# Patient Record
Sex: Female | Born: 1935
Health system: Southern US, Community
[De-identification: ages and names within clinical notes are randomized; demographics above are authoritative.]

## PROBLEM LIST (undated history)

## (undated) DIAGNOSIS — E079 Disorder of thyroid, unspecified: Secondary | ICD-10-CM

## (undated) DIAGNOSIS — C50919 Malignant neoplasm of unspecified site of unspecified female breast: Secondary | ICD-10-CM

## (undated) DIAGNOSIS — T7840XA Allergy, unspecified, initial encounter: Secondary | ICD-10-CM

## (undated) DIAGNOSIS — I10 Essential (primary) hypertension: Secondary | ICD-10-CM

## (undated) DIAGNOSIS — K219 Gastro-esophageal reflux disease without esophagitis: Secondary | ICD-10-CM

## (undated) DIAGNOSIS — I480 Paroxysmal atrial fibrillation: Secondary | ICD-10-CM

## (undated) DIAGNOSIS — F419 Anxiety disorder, unspecified: Secondary | ICD-10-CM

## (undated) DIAGNOSIS — I252 Old myocardial infarction: Secondary | ICD-10-CM

## (undated) DIAGNOSIS — I2510 Atherosclerotic heart disease of native coronary artery without angina pectoris: Secondary | ICD-10-CM

## (undated) DIAGNOSIS — B0229 Other postherpetic nervous system involvement: Secondary | ICD-10-CM

## (undated) DIAGNOSIS — I5030 Unspecified diastolic (congestive) heart failure: Secondary | ICD-10-CM

## (undated) DIAGNOSIS — D649 Anemia, unspecified: Secondary | ICD-10-CM

## (undated) DIAGNOSIS — E7800 Pure hypercholesterolemia, unspecified: Secondary | ICD-10-CM

## (undated) DIAGNOSIS — I214 Non-ST elevation (NSTEMI) myocardial infarction: Principal | ICD-10-CM

## (undated) DIAGNOSIS — M1990 Unspecified osteoarthritis, unspecified site: Secondary | ICD-10-CM

## (undated) DIAGNOSIS — M810 Age-related osteoporosis without current pathological fracture: Secondary | ICD-10-CM

## (undated) HISTORY — PX: BREAST SURGERY: SHX581

## (undated) HISTORY — PX: OTHER SURGICAL HISTORY: SHX169

## (undated) HISTORY — DX: Unspecified osteoarthritis, unspecified site: M19.90

## (undated) HISTORY — DX: Paroxysmal atrial fibrillation: I48.0

## (undated) HISTORY — PX: TONSILLECTOMY: SUR1361

## (undated) HISTORY — DX: Age-related osteoporosis without current pathological fracture: M81.0

## (undated) HISTORY — DX: Non-ST elevation (NSTEMI) myocardial infarction: I21.4

## (undated) HISTORY — DX: Anemia, unspecified: D64.9

## (undated) HISTORY — DX: Old myocardial infarction: I25.2

## (undated) HISTORY — DX: Other postherpetic nervous system involvement: B02.29

## (undated) HISTORY — DX: Allergy, unspecified, initial encounter: T78.40XA

---

## 2000-02-21 ENCOUNTER — Other Ambulatory Visit: Admission: RE | Admit: 2000-02-21 | Discharge: 2000-02-21 | Payer: Self-pay | Admitting: *Deleted

## 2000-03-27 ENCOUNTER — Encounter: Admission: RE | Admit: 2000-03-27 | Discharge: 2000-06-25 | Payer: Self-pay | Admitting: Radiation Oncology

## 2004-09-13 ENCOUNTER — Ambulatory Visit: Payer: Self-pay | Admitting: Oncology

## 2005-03-19 ENCOUNTER — Ambulatory Visit: Payer: Self-pay | Admitting: Oncology

## 2006-03-18 ENCOUNTER — Ambulatory Visit: Payer: Self-pay | Admitting: Oncology

## 2006-06-12 ENCOUNTER — Ambulatory Visit: Payer: Self-pay | Admitting: Oncology

## 2007-03-10 ENCOUNTER — Ambulatory Visit: Payer: Self-pay | Admitting: Oncology

## 2011-10-15 DIAGNOSIS — H1045 Other chronic allergic conjunctivitis: Secondary | ICD-10-CM | POA: Diagnosis not present

## 2011-10-15 DIAGNOSIS — H04129 Dry eye syndrome of unspecified lacrimal gland: Secondary | ICD-10-CM | POA: Diagnosis not present

## 2011-10-17 DIAGNOSIS — L821 Other seborrheic keratosis: Secondary | ICD-10-CM | POA: Diagnosis not present

## 2011-10-17 DIAGNOSIS — L723 Sebaceous cyst: Secondary | ICD-10-CM | POA: Diagnosis not present

## 2011-10-17 DIAGNOSIS — D235 Other benign neoplasm of skin of trunk: Secondary | ICD-10-CM | POA: Diagnosis not present

## 2011-10-23 DIAGNOSIS — R55 Syncope and collapse: Secondary | ICD-10-CM | POA: Diagnosis not present

## 2011-11-12 DIAGNOSIS — E039 Hypothyroidism, unspecified: Secondary | ICD-10-CM | POA: Diagnosis not present

## 2011-11-12 DIAGNOSIS — Z79899 Other long term (current) drug therapy: Secondary | ICD-10-CM | POA: Diagnosis not present

## 2011-11-12 DIAGNOSIS — E871 Hypo-osmolality and hyponatremia: Secondary | ICD-10-CM | POA: Diagnosis not present

## 2011-11-12 DIAGNOSIS — R1031 Right lower quadrant pain: Secondary | ICD-10-CM | POA: Diagnosis not present

## 2011-11-12 DIAGNOSIS — R55 Syncope and collapse: Secondary | ICD-10-CM | POA: Diagnosis not present

## 2011-11-16 DIAGNOSIS — M5137 Other intervertebral disc degeneration, lumbosacral region: Secondary | ICD-10-CM | POA: Diagnosis not present

## 2011-11-16 DIAGNOSIS — R1031 Right lower quadrant pain: Secondary | ICD-10-CM | POA: Diagnosis not present

## 2011-11-16 DIAGNOSIS — K7689 Other specified diseases of liver: Secondary | ICD-10-CM | POA: Diagnosis not present

## 2011-11-16 DIAGNOSIS — M47817 Spondylosis without myelopathy or radiculopathy, lumbosacral region: Secondary | ICD-10-CM | POA: Diagnosis not present

## 2011-11-21 DIAGNOSIS — R55 Syncope and collapse: Secondary | ICD-10-CM | POA: Diagnosis not present

## 2012-02-20 DIAGNOSIS — N6459 Other signs and symptoms in breast: Secondary | ICD-10-CM | POA: Diagnosis not present

## 2012-02-20 DIAGNOSIS — Z853 Personal history of malignant neoplasm of breast: Secondary | ICD-10-CM | POA: Diagnosis not present

## 2012-02-27 DIAGNOSIS — C50319 Malignant neoplasm of lower-inner quadrant of unspecified female breast: Secondary | ICD-10-CM | POA: Diagnosis not present

## 2012-02-27 DIAGNOSIS — N640 Fissure and fistula of nipple: Secondary | ICD-10-CM | POA: Diagnosis not present

## 2012-03-13 DIAGNOSIS — E039 Hypothyroidism, unspecified: Secondary | ICD-10-CM | POA: Diagnosis not present

## 2012-03-13 DIAGNOSIS — E038 Other specified hypothyroidism: Secondary | ICD-10-CM | POA: Diagnosis not present

## 2012-03-13 DIAGNOSIS — J309 Allergic rhinitis, unspecified: Secondary | ICD-10-CM | POA: Diagnosis not present

## 2012-03-13 DIAGNOSIS — E538 Deficiency of other specified B group vitamins: Secondary | ICD-10-CM | POA: Diagnosis not present

## 2012-03-13 DIAGNOSIS — R195 Other fecal abnormalities: Secondary | ICD-10-CM | POA: Diagnosis not present

## 2012-03-13 DIAGNOSIS — M810 Age-related osteoporosis without current pathological fracture: Secondary | ICD-10-CM | POA: Diagnosis not present

## 2012-03-13 DIAGNOSIS — R42 Dizziness and giddiness: Secondary | ICD-10-CM | POA: Diagnosis not present

## 2012-03-13 DIAGNOSIS — F341 Dysthymic disorder: Secondary | ICD-10-CM | POA: Diagnosis not present

## 2012-03-13 DIAGNOSIS — I10 Essential (primary) hypertension: Secondary | ICD-10-CM | POA: Diagnosis not present

## 2012-03-17 DIAGNOSIS — M533 Sacrococcygeal disorders, not elsewhere classified: Secondary | ICD-10-CM | POA: Diagnosis not present

## 2012-03-19 DIAGNOSIS — M533 Sacrococcygeal disorders, not elsewhere classified: Secondary | ICD-10-CM | POA: Diagnosis not present

## 2012-03-21 DIAGNOSIS — M533 Sacrococcygeal disorders, not elsewhere classified: Secondary | ICD-10-CM | POA: Diagnosis not present

## 2012-03-24 DIAGNOSIS — M533 Sacrococcygeal disorders, not elsewhere classified: Secondary | ICD-10-CM | POA: Diagnosis not present

## 2012-03-26 DIAGNOSIS — M533 Sacrococcygeal disorders, not elsewhere classified: Secondary | ICD-10-CM | POA: Diagnosis not present

## 2012-03-31 DIAGNOSIS — M533 Sacrococcygeal disorders, not elsewhere classified: Secondary | ICD-10-CM | POA: Diagnosis not present

## 2012-04-02 DIAGNOSIS — M533 Sacrococcygeal disorders, not elsewhere classified: Secondary | ICD-10-CM | POA: Diagnosis not present

## 2012-04-04 DIAGNOSIS — M533 Sacrococcygeal disorders, not elsewhere classified: Secondary | ICD-10-CM | POA: Diagnosis not present

## 2012-04-07 DIAGNOSIS — M533 Sacrococcygeal disorders, not elsewhere classified: Secondary | ICD-10-CM | POA: Diagnosis not present

## 2012-04-13 DIAGNOSIS — J069 Acute upper respiratory infection, unspecified: Secondary | ICD-10-CM | POA: Diagnosis not present

## 2012-04-13 DIAGNOSIS — J209 Acute bronchitis, unspecified: Secondary | ICD-10-CM | POA: Diagnosis not present

## 2012-05-13 DIAGNOSIS — R42 Dizziness and giddiness: Secondary | ICD-10-CM | POA: Diagnosis not present

## 2012-05-13 DIAGNOSIS — J309 Allergic rhinitis, unspecified: Secondary | ICD-10-CM | POA: Diagnosis not present

## 2012-05-13 DIAGNOSIS — F341 Dysthymic disorder: Secondary | ICD-10-CM | POA: Diagnosis not present

## 2012-05-13 DIAGNOSIS — E039 Hypothyroidism, unspecified: Secondary | ICD-10-CM | POA: Diagnosis not present

## 2012-05-13 DIAGNOSIS — I10 Essential (primary) hypertension: Secondary | ICD-10-CM | POA: Diagnosis not present

## 2012-05-13 DIAGNOSIS — F5102 Adjustment insomnia: Secondary | ICD-10-CM | POA: Diagnosis not present

## 2012-05-13 DIAGNOSIS — M810 Age-related osteoporosis without current pathological fracture: Secondary | ICD-10-CM | POA: Diagnosis not present

## 2012-05-13 DIAGNOSIS — G459 Transient cerebral ischemic attack, unspecified: Secondary | ICD-10-CM | POA: Diagnosis not present

## 2012-05-26 DIAGNOSIS — R55 Syncope and collapse: Secondary | ICD-10-CM | POA: Diagnosis not present

## 2012-06-04 DIAGNOSIS — R413 Other amnesia: Secondary | ICD-10-CM | POA: Diagnosis not present

## 2012-06-04 DIAGNOSIS — F5102 Adjustment insomnia: Secondary | ICD-10-CM | POA: Diagnosis not present

## 2012-06-04 DIAGNOSIS — F411 Generalized anxiety disorder: Secondary | ICD-10-CM | POA: Diagnosis not present

## 2012-06-04 DIAGNOSIS — E038 Other specified hypothyroidism: Secondary | ICD-10-CM | POA: Diagnosis not present

## 2012-06-04 DIAGNOSIS — I10 Essential (primary) hypertension: Secondary | ICD-10-CM | POA: Diagnosis not present

## 2012-06-17 DIAGNOSIS — M5137 Other intervertebral disc degeneration, lumbosacral region: Secondary | ICD-10-CM | POA: Diagnosis not present

## 2012-06-17 DIAGNOSIS — M25559 Pain in unspecified hip: Secondary | ICD-10-CM | POA: Diagnosis not present

## 2012-06-24 DIAGNOSIS — M25559 Pain in unspecified hip: Secondary | ICD-10-CM | POA: Diagnosis not present

## 2012-07-01 DIAGNOSIS — R413 Other amnesia: Secondary | ICD-10-CM | POA: Diagnosis not present

## 2012-07-01 DIAGNOSIS — H9319 Tinnitus, unspecified ear: Secondary | ICD-10-CM | POA: Diagnosis not present

## 2012-07-01 DIAGNOSIS — I10 Essential (primary) hypertension: Secondary | ICD-10-CM | POA: Diagnosis not present

## 2012-07-01 DIAGNOSIS — F5102 Adjustment insomnia: Secondary | ICD-10-CM | POA: Diagnosis not present

## 2012-07-01 DIAGNOSIS — H9190 Unspecified hearing loss, unspecified ear: Secondary | ICD-10-CM | POA: Diagnosis not present

## 2012-07-01 DIAGNOSIS — F411 Generalized anxiety disorder: Secondary | ICD-10-CM | POA: Diagnosis not present

## 2012-07-29 DIAGNOSIS — Z23 Encounter for immunization: Secondary | ICD-10-CM | POA: Diagnosis not present

## 2012-07-29 DIAGNOSIS — G459 Transient cerebral ischemic attack, unspecified: Secondary | ICD-10-CM | POA: Diagnosis not present

## 2012-07-29 DIAGNOSIS — H9319 Tinnitus, unspecified ear: Secondary | ICD-10-CM | POA: Diagnosis not present

## 2012-07-29 DIAGNOSIS — H538 Other visual disturbances: Secondary | ICD-10-CM | POA: Diagnosis not present

## 2012-07-29 DIAGNOSIS — F411 Generalized anxiety disorder: Secondary | ICD-10-CM | POA: Diagnosis not present

## 2012-07-30 DIAGNOSIS — E038 Other specified hypothyroidism: Secondary | ICD-10-CM | POA: Diagnosis not present

## 2012-07-30 DIAGNOSIS — Z79899 Other long term (current) drug therapy: Secondary | ICD-10-CM | POA: Diagnosis not present

## 2012-08-06 DIAGNOSIS — H538 Other visual disturbances: Secondary | ICD-10-CM | POA: Diagnosis not present

## 2012-08-06 DIAGNOSIS — G459 Transient cerebral ischemic attack, unspecified: Secondary | ICD-10-CM | POA: Diagnosis not present

## 2012-08-06 DIAGNOSIS — I6529 Occlusion and stenosis of unspecified carotid artery: Secondary | ICD-10-CM | POA: Diagnosis not present

## 2012-08-06 DIAGNOSIS — I658 Occlusion and stenosis of other precerebral arteries: Secondary | ICD-10-CM | POA: Diagnosis not present

## 2012-08-06 DIAGNOSIS — R413 Other amnesia: Secondary | ICD-10-CM | POA: Diagnosis not present

## 2012-08-11 DIAGNOSIS — L219 Seborrheic dermatitis, unspecified: Secondary | ICD-10-CM | POA: Diagnosis not present

## 2012-08-11 DIAGNOSIS — L02818 Cutaneous abscess of other sites: Secondary | ICD-10-CM | POA: Diagnosis not present

## 2012-08-11 DIAGNOSIS — L723 Sebaceous cyst: Secondary | ICD-10-CM | POA: Diagnosis not present

## 2012-09-09 DIAGNOSIS — L219 Seborrheic dermatitis, unspecified: Secondary | ICD-10-CM | POA: Diagnosis not present

## 2012-09-22 DIAGNOSIS — R1084 Generalized abdominal pain: Secondary | ICD-10-CM | POA: Diagnosis not present

## 2012-09-22 DIAGNOSIS — G459 Transient cerebral ischemic attack, unspecified: Secondary | ICD-10-CM | POA: Diagnosis not present

## 2012-10-07 DIAGNOSIS — L03211 Cellulitis of face: Secondary | ICD-10-CM | POA: Diagnosis not present

## 2012-10-07 DIAGNOSIS — L0201 Cutaneous abscess of face: Secondary | ICD-10-CM | POA: Diagnosis not present

## 2012-11-06 DIAGNOSIS — K589 Irritable bowel syndrome without diarrhea: Secondary | ICD-10-CM | POA: Diagnosis not present

## 2012-11-17 DIAGNOSIS — M810 Age-related osteoporosis without current pathological fracture: Secondary | ICD-10-CM | POA: Diagnosis not present

## 2012-12-01 DIAGNOSIS — M545 Low back pain: Secondary | ICD-10-CM | POA: Diagnosis not present

## 2013-01-20 DIAGNOSIS — F331 Major depressive disorder, recurrent, moderate: Secondary | ICD-10-CM | POA: Diagnosis not present

## 2013-01-20 DIAGNOSIS — I10 Essential (primary) hypertension: Secondary | ICD-10-CM | POA: Diagnosis not present

## 2013-01-20 DIAGNOSIS — E038 Other specified hypothyroidism: Secondary | ICD-10-CM | POA: Diagnosis not present

## 2013-02-13 DIAGNOSIS — H04129 Dry eye syndrome of unspecified lacrimal gland: Secondary | ICD-10-CM | POA: Diagnosis not present

## 2013-02-13 DIAGNOSIS — H26499 Other secondary cataract, unspecified eye: Secondary | ICD-10-CM | POA: Diagnosis not present

## 2013-02-13 DIAGNOSIS — H3530 Unspecified macular degeneration: Secondary | ICD-10-CM | POA: Diagnosis not present

## 2013-04-16 DIAGNOSIS — Z1231 Encounter for screening mammogram for malignant neoplasm of breast: Secondary | ICD-10-CM | POA: Diagnosis not present

## 2013-05-06 DIAGNOSIS — E018 Other iodine-deficiency related thyroid disorders and allied conditions: Secondary | ICD-10-CM | POA: Diagnosis not present

## 2013-05-06 DIAGNOSIS — E038 Other specified hypothyroidism: Secondary | ICD-10-CM | POA: Diagnosis not present

## 2013-05-19 DIAGNOSIS — I10 Essential (primary) hypertension: Secondary | ICD-10-CM | POA: Diagnosis not present

## 2013-05-19 DIAGNOSIS — E018 Other iodine-deficiency related thyroid disorders and allied conditions: Secondary | ICD-10-CM | POA: Diagnosis not present

## 2013-05-19 DIAGNOSIS — M545 Low back pain: Secondary | ICD-10-CM | POA: Diagnosis not present

## 2013-05-19 DIAGNOSIS — M810 Age-related osteoporosis without current pathological fracture: Secondary | ICD-10-CM | POA: Diagnosis not present

## 2013-07-03 DIAGNOSIS — R0789 Other chest pain: Secondary | ICD-10-CM | POA: Diagnosis not present

## 2013-07-03 DIAGNOSIS — I10 Essential (primary) hypertension: Secondary | ICD-10-CM | POA: Diagnosis not present

## 2013-07-03 DIAGNOSIS — R0602 Shortness of breath: Secondary | ICD-10-CM | POA: Diagnosis not present

## 2013-07-06 DIAGNOSIS — Z23 Encounter for immunization: Secondary | ICD-10-CM | POA: Diagnosis not present

## 2013-07-09 DIAGNOSIS — R0602 Shortness of breath: Secondary | ICD-10-CM | POA: Diagnosis not present

## 2013-07-09 DIAGNOSIS — Z87891 Personal history of nicotine dependence: Secondary | ICD-10-CM | POA: Diagnosis not present

## 2013-07-09 DIAGNOSIS — R05 Cough: Secondary | ICD-10-CM | POA: Diagnosis not present

## 2013-07-22 DIAGNOSIS — E039 Hypothyroidism, unspecified: Secondary | ICD-10-CM | POA: Diagnosis not present

## 2013-08-05 DIAGNOSIS — J45909 Unspecified asthma, uncomplicated: Secondary | ICD-10-CM | POA: Diagnosis not present

## 2013-08-17 DIAGNOSIS — H3530 Unspecified macular degeneration: Secondary | ICD-10-CM | POA: Diagnosis not present

## 2013-10-05 DIAGNOSIS — N3000 Acute cystitis without hematuria: Secondary | ICD-10-CM | POA: Diagnosis not present

## 2013-10-13 DIAGNOSIS — A020 Salmonella enteritis: Secondary | ICD-10-CM | POA: Diagnosis not present

## 2013-10-21 DIAGNOSIS — N3000 Acute cystitis without hematuria: Secondary | ICD-10-CM | POA: Diagnosis not present

## 2013-11-30 DIAGNOSIS — M810 Age-related osteoporosis without current pathological fracture: Secondary | ICD-10-CM | POA: Diagnosis not present

## 2013-12-14 DIAGNOSIS — M25519 Pain in unspecified shoulder: Secondary | ICD-10-CM | POA: Diagnosis not present

## 2013-12-14 DIAGNOSIS — L0201 Cutaneous abscess of face: Secondary | ICD-10-CM | POA: Diagnosis not present

## 2013-12-14 DIAGNOSIS — L03211 Cellulitis of face: Secondary | ICD-10-CM | POA: Diagnosis not present

## 2013-12-31 DIAGNOSIS — H1130 Conjunctival hemorrhage, unspecified eye: Secondary | ICD-10-CM | POA: Diagnosis not present

## 2014-01-11 DIAGNOSIS — H5710 Ocular pain, unspecified eye: Secondary | ICD-10-CM | POA: Diagnosis not present

## 2014-02-16 DIAGNOSIS — R079 Chest pain, unspecified: Secondary | ICD-10-CM | POA: Diagnosis not present

## 2014-02-16 DIAGNOSIS — R1013 Epigastric pain: Secondary | ICD-10-CM | POA: Diagnosis not present

## 2014-02-16 DIAGNOSIS — R0602 Shortness of breath: Secondary | ICD-10-CM | POA: Diagnosis not present

## 2014-02-16 DIAGNOSIS — R109 Unspecified abdominal pain: Secondary | ICD-10-CM | POA: Diagnosis not present

## 2014-02-16 DIAGNOSIS — R112 Nausea with vomiting, unspecified: Secondary | ICD-10-CM | POA: Diagnosis not present

## 2014-02-16 DIAGNOSIS — R0789 Other chest pain: Secondary | ICD-10-CM | POA: Diagnosis not present

## 2014-02-16 DIAGNOSIS — N281 Cyst of kidney, acquired: Secondary | ICD-10-CM | POA: Diagnosis not present

## 2014-02-16 DIAGNOSIS — K5730 Diverticulosis of large intestine without perforation or abscess without bleeding: Secondary | ICD-10-CM | POA: Diagnosis not present

## 2014-02-17 DIAGNOSIS — K5730 Diverticulosis of large intestine without perforation or abscess without bleeding: Secondary | ICD-10-CM | POA: Diagnosis not present

## 2014-02-17 DIAGNOSIS — R0602 Shortness of breath: Secondary | ICD-10-CM | POA: Diagnosis not present

## 2014-02-17 DIAGNOSIS — N281 Cyst of kidney, acquired: Secondary | ICD-10-CM | POA: Diagnosis not present

## 2014-02-18 DIAGNOSIS — R5383 Other fatigue: Secondary | ICD-10-CM | POA: Diagnosis not present

## 2014-02-18 DIAGNOSIS — K2900 Acute gastritis without bleeding: Secondary | ICD-10-CM | POA: Diagnosis not present

## 2014-02-18 DIAGNOSIS — R5381 Other malaise: Secondary | ICD-10-CM | POA: Diagnosis not present

## 2014-02-18 DIAGNOSIS — I10 Essential (primary) hypertension: Secondary | ICD-10-CM | POA: Diagnosis not present

## 2014-02-18 DIAGNOSIS — R109 Unspecified abdominal pain: Secondary | ICD-10-CM | POA: Diagnosis not present

## 2014-02-23 DIAGNOSIS — R062 Wheezing: Secondary | ICD-10-CM | POA: Diagnosis not present

## 2014-02-23 DIAGNOSIS — I10 Essential (primary) hypertension: Secondary | ICD-10-CM | POA: Diagnosis not present

## 2014-02-23 DIAGNOSIS — R0789 Other chest pain: Secondary | ICD-10-CM | POA: Diagnosis not present

## 2014-02-23 DIAGNOSIS — R109 Unspecified abdominal pain: Secondary | ICD-10-CM | POA: Diagnosis not present

## 2014-02-23 DIAGNOSIS — Z87891 Personal history of nicotine dependence: Secondary | ICD-10-CM | POA: Diagnosis not present

## 2014-02-23 DIAGNOSIS — R52 Pain, unspecified: Secondary | ICD-10-CM | POA: Diagnosis not present

## 2014-02-23 DIAGNOSIS — F411 Generalized anxiety disorder: Secondary | ICD-10-CM | POA: Diagnosis present

## 2014-02-23 DIAGNOSIS — E039 Hypothyroidism, unspecified: Secondary | ICD-10-CM | POA: Diagnosis present

## 2014-02-23 DIAGNOSIS — R7989 Other specified abnormal findings of blood chemistry: Secondary | ICD-10-CM | POA: Diagnosis not present

## 2014-02-23 DIAGNOSIS — K7689 Other specified diseases of liver: Secondary | ICD-10-CM | POA: Diagnosis not present

## 2014-02-23 DIAGNOSIS — N3000 Acute cystitis without hematuria: Secondary | ICD-10-CM | POA: Diagnosis present

## 2014-02-23 DIAGNOSIS — B372 Candidiasis of skin and nail: Secondary | ICD-10-CM | POA: Diagnosis present

## 2014-02-23 DIAGNOSIS — M109 Gout, unspecified: Secondary | ICD-10-CM | POA: Diagnosis present

## 2014-02-23 DIAGNOSIS — Z66 Do not resuscitate: Secondary | ICD-10-CM | POA: Diagnosis present

## 2014-02-23 DIAGNOSIS — S301XXA Contusion of abdominal wall, initial encounter: Secondary | ICD-10-CM | POA: Diagnosis present

## 2014-02-23 DIAGNOSIS — Z79899 Other long term (current) drug therapy: Secondary | ICD-10-CM | POA: Diagnosis not present

## 2014-02-23 DIAGNOSIS — I2510 Atherosclerotic heart disease of native coronary artery without angina pectoris: Secondary | ICD-10-CM | POA: Diagnosis not present

## 2014-02-23 DIAGNOSIS — I5033 Acute on chronic diastolic (congestive) heart failure: Secondary | ICD-10-CM | POA: Diagnosis not present

## 2014-02-23 DIAGNOSIS — I472 Ventricular tachycardia: Secondary | ICD-10-CM | POA: Diagnosis not present

## 2014-02-23 DIAGNOSIS — H409 Unspecified glaucoma: Secondary | ICD-10-CM | POA: Diagnosis present

## 2014-02-23 DIAGNOSIS — I5031 Acute diastolic (congestive) heart failure: Secondary | ICD-10-CM | POA: Diagnosis not present

## 2014-02-23 DIAGNOSIS — K449 Diaphragmatic hernia without obstruction or gangrene: Secondary | ICD-10-CM | POA: Diagnosis not present

## 2014-02-23 DIAGNOSIS — R0602 Shortness of breath: Secondary | ICD-10-CM | POA: Diagnosis not present

## 2014-02-23 DIAGNOSIS — K219 Gastro-esophageal reflux disease without esophagitis: Secondary | ICD-10-CM | POA: Diagnosis present

## 2014-02-23 DIAGNOSIS — M545 Low back pain, unspecified: Secondary | ICD-10-CM | POA: Diagnosis not present

## 2014-02-23 DIAGNOSIS — M1990 Unspecified osteoarthritis, unspecified site: Secondary | ICD-10-CM | POA: Diagnosis present

## 2014-02-23 DIAGNOSIS — M2550 Pain in unspecified joint: Secondary | ICD-10-CM | POA: Diagnosis not present

## 2014-02-23 DIAGNOSIS — Z853 Personal history of malignant neoplasm of breast: Secondary | ICD-10-CM | POA: Diagnosis not present

## 2014-02-23 DIAGNOSIS — I359 Nonrheumatic aortic valve disorder, unspecified: Secondary | ICD-10-CM | POA: Diagnosis not present

## 2014-02-23 DIAGNOSIS — M47817 Spondylosis without myelopathy or radiculopathy, lumbosacral region: Secondary | ICD-10-CM | POA: Diagnosis not present

## 2014-02-23 DIAGNOSIS — S20229A Contusion of unspecified back wall of thorax, initial encounter: Secondary | ICD-10-CM | POA: Diagnosis present

## 2014-02-23 DIAGNOSIS — I4729 Other ventricular tachycardia: Secondary | ICD-10-CM | POA: Diagnosis not present

## 2014-02-23 DIAGNOSIS — G929 Unspecified toxic encephalopathy: Secondary | ICD-10-CM | POA: Diagnosis not present

## 2014-02-23 DIAGNOSIS — I471 Supraventricular tachycardia: Secondary | ICD-10-CM | POA: Diagnosis not present

## 2014-02-23 DIAGNOSIS — R9431 Abnormal electrocardiogram [ECG] [EKG]: Secondary | ICD-10-CM | POA: Diagnosis not present

## 2014-02-23 DIAGNOSIS — Z7401 Bed confinement status: Secondary | ICD-10-CM | POA: Diagnosis not present

## 2014-02-23 DIAGNOSIS — E871 Hypo-osmolality and hyponatremia: Secondary | ICD-10-CM | POA: Diagnosis not present

## 2014-02-23 DIAGNOSIS — G92 Toxic encephalopathy: Secondary | ICD-10-CM | POA: Diagnosis not present

## 2014-02-23 DIAGNOSIS — I509 Heart failure, unspecified: Secondary | ICD-10-CM | POA: Diagnosis present

## 2014-02-23 DIAGNOSIS — N390 Urinary tract infection, site not specified: Secondary | ICD-10-CM | POA: Diagnosis not present

## 2014-02-28 DIAGNOSIS — I5031 Acute diastolic (congestive) heart failure: Secondary | ICD-10-CM | POA: Diagnosis not present

## 2014-02-28 DIAGNOSIS — D638 Anemia in other chronic diseases classified elsewhere: Secondary | ICD-10-CM | POA: Diagnosis not present

## 2014-02-28 DIAGNOSIS — I471 Supraventricular tachycardia: Secondary | ICD-10-CM | POA: Diagnosis not present

## 2014-02-28 DIAGNOSIS — I2510 Atherosclerotic heart disease of native coronary artery without angina pectoris: Secondary | ICD-10-CM | POA: Diagnosis not present

## 2014-02-28 DIAGNOSIS — M2550 Pain in unspecified joint: Secondary | ICD-10-CM | POA: Diagnosis not present

## 2014-02-28 DIAGNOSIS — R0789 Other chest pain: Secondary | ICD-10-CM | POA: Diagnosis not present

## 2014-02-28 DIAGNOSIS — I10 Essential (primary) hypertension: Secondary | ICD-10-CM | POA: Diagnosis not present

## 2014-02-28 DIAGNOSIS — Z7401 Bed confinement status: Secondary | ICD-10-CM | POA: Diagnosis not present

## 2014-02-28 DIAGNOSIS — I130 Hypertensive heart and chronic kidney disease with heart failure and stage 1 through stage 4 chronic kidney disease, or unspecified chronic kidney disease: Secondary | ICD-10-CM | POA: Diagnosis not present

## 2014-02-28 DIAGNOSIS — E871 Hypo-osmolality and hyponatremia: Secondary | ICD-10-CM | POA: Diagnosis not present

## 2014-02-28 DIAGNOSIS — N390 Urinary tract infection, site not specified: Secondary | ICD-10-CM | POA: Diagnosis not present

## 2014-03-02 DIAGNOSIS — I2510 Atherosclerotic heart disease of native coronary artery without angina pectoris: Secondary | ICD-10-CM | POA: Diagnosis not present

## 2014-03-02 DIAGNOSIS — E871 Hypo-osmolality and hyponatremia: Secondary | ICD-10-CM | POA: Diagnosis not present

## 2014-03-02 DIAGNOSIS — I130 Hypertensive heart and chronic kidney disease with heart failure and stage 1 through stage 4 chronic kidney disease, or unspecified chronic kidney disease: Secondary | ICD-10-CM | POA: Diagnosis not present

## 2014-03-02 DIAGNOSIS — D638 Anemia in other chronic diseases classified elsewhere: Secondary | ICD-10-CM | POA: Diagnosis not present

## 2014-03-22 DIAGNOSIS — E871 Hypo-osmolality and hyponatremia: Secondary | ICD-10-CM | POA: Diagnosis not present

## 2014-04-05 DIAGNOSIS — Z79899 Other long term (current) drug therapy: Secondary | ICD-10-CM | POA: Diagnosis not present

## 2014-04-21 DIAGNOSIS — E871 Hypo-osmolality and hyponatremia: Secondary | ICD-10-CM | POA: Diagnosis not present

## 2014-04-21 DIAGNOSIS — E018 Other iodine-deficiency related thyroid disorders and allied conditions: Secondary | ICD-10-CM | POA: Diagnosis not present

## 2014-04-21 DIAGNOSIS — I10 Essential (primary) hypertension: Secondary | ICD-10-CM | POA: Diagnosis not present

## 2014-05-10 DIAGNOSIS — N61 Mastitis without abscess: Secondary | ICD-10-CM | POA: Diagnosis not present

## 2014-05-18 DIAGNOSIS — L600 Ingrowing nail: Secondary | ICD-10-CM | POA: Diagnosis not present

## 2014-06-07 DIAGNOSIS — M810 Age-related osteoporosis without current pathological fracture: Secondary | ICD-10-CM | POA: Diagnosis not present

## 2014-06-29 DIAGNOSIS — I10 Essential (primary) hypertension: Secondary | ICD-10-CM | POA: Diagnosis not present

## 2014-06-29 DIAGNOSIS — E018 Other iodine-deficiency related thyroid disorders and allied conditions: Secondary | ICD-10-CM | POA: Diagnosis not present

## 2014-06-29 DIAGNOSIS — E871 Hypo-osmolality and hyponatremia: Secondary | ICD-10-CM | POA: Diagnosis not present

## 2014-07-13 DIAGNOSIS — Z23 Encounter for immunization: Secondary | ICD-10-CM | POA: Diagnosis not present

## 2014-07-20 DIAGNOSIS — H3530 Unspecified macular degeneration: Secondary | ICD-10-CM | POA: Diagnosis not present

## 2014-08-26 DIAGNOSIS — F5101 Primary insomnia: Secondary | ICD-10-CM | POA: Diagnosis not present

## 2014-08-26 DIAGNOSIS — E034 Atrophy of thyroid (acquired): Secondary | ICD-10-CM | POA: Diagnosis not present

## 2014-08-26 DIAGNOSIS — F331 Major depressive disorder, recurrent, moderate: Secondary | ICD-10-CM | POA: Diagnosis not present

## 2014-08-26 DIAGNOSIS — I10 Essential (primary) hypertension: Secondary | ICD-10-CM | POA: Diagnosis not present

## 2014-08-26 DIAGNOSIS — E890 Postprocedural hypothyroidism: Secondary | ICD-10-CM | POA: Diagnosis not present

## 2014-08-26 DIAGNOSIS — Z1231 Encounter for screening mammogram for malignant neoplasm of breast: Secondary | ICD-10-CM | POA: Diagnosis not present

## 2014-09-16 DIAGNOSIS — Z1231 Encounter for screening mammogram for malignant neoplasm of breast: Secondary | ICD-10-CM | POA: Diagnosis not present

## 2014-10-05 DIAGNOSIS — L219 Seborrheic dermatitis, unspecified: Secondary | ICD-10-CM | POA: Diagnosis not present

## 2014-10-05 DIAGNOSIS — L299 Pruritus, unspecified: Secondary | ICD-10-CM | POA: Diagnosis not present

## 2014-11-03 DIAGNOSIS — E034 Atrophy of thyroid (acquired): Secondary | ICD-10-CM | POA: Diagnosis not present

## 2014-11-10 DIAGNOSIS — I7300 Raynaud's syndrome without gangrene: Secondary | ICD-10-CM | POA: Diagnosis not present

## 2014-11-10 DIAGNOSIS — L97519 Non-pressure chronic ulcer of other part of right foot with unspecified severity: Secondary | ICD-10-CM | POA: Diagnosis not present

## 2014-11-10 DIAGNOSIS — R200 Anesthesia of skin: Secondary | ICD-10-CM | POA: Diagnosis not present

## 2014-11-30 DIAGNOSIS — N3000 Acute cystitis without hematuria: Secondary | ICD-10-CM | POA: Diagnosis not present

## 2014-11-30 DIAGNOSIS — M545 Low back pain: Secondary | ICD-10-CM | POA: Diagnosis not present

## 2014-12-28 DIAGNOSIS — M810 Age-related osteoporosis without current pathological fracture: Secondary | ICD-10-CM | POA: Diagnosis not present

## 2015-01-13 DIAGNOSIS — R29898 Other symptoms and signs involving the musculoskeletal system: Secondary | ICD-10-CM | POA: Diagnosis not present

## 2015-01-13 DIAGNOSIS — M109 Gout, unspecified: Secondary | ICD-10-CM | POA: Diagnosis not present

## 2015-01-13 DIAGNOSIS — I10 Essential (primary) hypertension: Secondary | ICD-10-CM | POA: Diagnosis not present

## 2015-01-13 DIAGNOSIS — J301 Allergic rhinitis due to pollen: Secondary | ICD-10-CM | POA: Diagnosis not present

## 2015-02-07 DIAGNOSIS — M545 Low back pain: Secondary | ICD-10-CM | POA: Diagnosis not present

## 2015-02-07 DIAGNOSIS — R29818 Other symptoms and signs involving the nervous system: Secondary | ICD-10-CM | POA: Diagnosis not present

## 2015-02-07 DIAGNOSIS — M5136 Other intervertebral disc degeneration, lumbar region: Secondary | ICD-10-CM | POA: Diagnosis not present

## 2015-02-10 DIAGNOSIS — M47816 Spondylosis without myelopathy or radiculopathy, lumbar region: Secondary | ICD-10-CM | POA: Diagnosis not present

## 2015-02-10 DIAGNOSIS — M4806 Spinal stenosis, lumbar region: Secondary | ICD-10-CM | POA: Diagnosis not present

## 2015-02-10 DIAGNOSIS — M9973 Connective tissue and disc stenosis of intervertebral foramina of lumbar region: Secondary | ICD-10-CM | POA: Diagnosis not present

## 2015-02-10 DIAGNOSIS — M5136 Other intervertebral disc degeneration, lumbar region: Secondary | ICD-10-CM | POA: Diagnosis not present

## 2015-02-10 DIAGNOSIS — M545 Low back pain: Secondary | ICD-10-CM | POA: Diagnosis not present

## 2015-03-02 DIAGNOSIS — M5440 Lumbago with sciatica, unspecified side: Secondary | ICD-10-CM | POA: Diagnosis not present

## 2015-03-02 DIAGNOSIS — M545 Low back pain: Secondary | ICD-10-CM | POA: Diagnosis not present

## 2015-03-04 DIAGNOSIS — M545 Low back pain: Secondary | ICD-10-CM | POA: Diagnosis not present

## 2015-03-04 DIAGNOSIS — M5440 Lumbago with sciatica, unspecified side: Secondary | ICD-10-CM | POA: Diagnosis not present

## 2015-03-11 DIAGNOSIS — M545 Low back pain: Secondary | ICD-10-CM | POA: Diagnosis not present

## 2015-03-11 DIAGNOSIS — M5440 Lumbago with sciatica, unspecified side: Secondary | ICD-10-CM | POA: Diagnosis not present

## 2015-03-14 DIAGNOSIS — M5440 Lumbago with sciatica, unspecified side: Secondary | ICD-10-CM | POA: Diagnosis not present

## 2015-03-14 DIAGNOSIS — M545 Low back pain: Secondary | ICD-10-CM | POA: Diagnosis not present

## 2015-03-17 DIAGNOSIS — M5440 Lumbago with sciatica, unspecified side: Secondary | ICD-10-CM | POA: Diagnosis not present

## 2015-03-17 DIAGNOSIS — M545 Low back pain: Secondary | ICD-10-CM | POA: Diagnosis not present

## 2015-05-12 DIAGNOSIS — J0100 Acute maxillary sinusitis, unspecified: Secondary | ICD-10-CM | POA: Diagnosis not present

## 2015-05-24 DIAGNOSIS — J0100 Acute maxillary sinusitis, unspecified: Secondary | ICD-10-CM | POA: Diagnosis not present

## 2015-05-24 DIAGNOSIS — J342 Deviated nasal septum: Secondary | ICD-10-CM | POA: Diagnosis not present

## 2015-05-27 DIAGNOSIS — H1011 Acute atopic conjunctivitis, right eye: Secondary | ICD-10-CM | POA: Diagnosis not present

## 2015-05-31 DIAGNOSIS — H1011 Acute atopic conjunctivitis, right eye: Secondary | ICD-10-CM | POA: Diagnosis not present

## 2015-06-09 DIAGNOSIS — B029 Zoster without complications: Secondary | ICD-10-CM | POA: Diagnosis not present

## 2015-06-09 DIAGNOSIS — E890 Postprocedural hypothyroidism: Secondary | ICD-10-CM | POA: Diagnosis not present

## 2015-06-09 DIAGNOSIS — E538 Deficiency of other specified B group vitamins: Secondary | ICD-10-CM | POA: Diagnosis not present

## 2015-06-09 DIAGNOSIS — F331 Major depressive disorder, recurrent, moderate: Secondary | ICD-10-CM | POA: Diagnosis not present

## 2015-06-09 DIAGNOSIS — Z23 Encounter for immunization: Secondary | ICD-10-CM | POA: Diagnosis not present

## 2015-06-09 DIAGNOSIS — I10 Essential (primary) hypertension: Secondary | ICD-10-CM | POA: Diagnosis not present

## 2015-06-29 DIAGNOSIS — B0223 Postherpetic polyneuropathy: Secondary | ICD-10-CM | POA: Diagnosis not present

## 2015-07-21 DIAGNOSIS — M810 Age-related osteoporosis without current pathological fracture: Secondary | ICD-10-CM | POA: Diagnosis not present

## 2015-07-22 DIAGNOSIS — H3530 Unspecified macular degeneration: Secondary | ICD-10-CM | POA: Diagnosis not present

## 2015-07-26 DIAGNOSIS — N3001 Acute cystitis with hematuria: Secondary | ICD-10-CM | POA: Diagnosis not present

## 2015-08-15 DIAGNOSIS — E034 Atrophy of thyroid (acquired): Secondary | ICD-10-CM | POA: Diagnosis not present

## 2015-09-06 DIAGNOSIS — B0223 Postherpetic polyneuropathy: Secondary | ICD-10-CM | POA: Diagnosis not present

## 2015-10-24 DIAGNOSIS — I10 Essential (primary) hypertension: Secondary | ICD-10-CM | POA: Diagnosis not present

## 2015-10-24 DIAGNOSIS — N3001 Acute cystitis with hematuria: Secondary | ICD-10-CM | POA: Diagnosis not present

## 2015-10-24 DIAGNOSIS — E890 Postprocedural hypothyroidism: Secondary | ICD-10-CM | POA: Diagnosis not present

## 2016-01-31 DIAGNOSIS — L299 Pruritus, unspecified: Secondary | ICD-10-CM | POA: Diagnosis not present

## 2016-01-31 DIAGNOSIS — L219 Seborrheic dermatitis, unspecified: Secondary | ICD-10-CM | POA: Diagnosis not present

## 2016-03-29 DIAGNOSIS — M810 Age-related osteoporosis without current pathological fracture: Secondary | ICD-10-CM | POA: Diagnosis not present

## 2016-05-15 DIAGNOSIS — Z1231 Encounter for screening mammogram for malignant neoplasm of breast: Secondary | ICD-10-CM | POA: Diagnosis not present

## 2016-05-15 DIAGNOSIS — F411 Generalized anxiety disorder: Secondary | ICD-10-CM | POA: Diagnosis not present

## 2016-05-15 DIAGNOSIS — E890 Postprocedural hypothyroidism: Secondary | ICD-10-CM | POA: Diagnosis not present

## 2016-05-15 DIAGNOSIS — I10 Essential (primary) hypertension: Secondary | ICD-10-CM | POA: Diagnosis not present

## 2016-05-15 DIAGNOSIS — M25551 Pain in right hip: Secondary | ICD-10-CM | POA: Diagnosis not present

## 2016-05-15 DIAGNOSIS — M25552 Pain in left hip: Secondary | ICD-10-CM | POA: Diagnosis not present

## 2016-05-15 DIAGNOSIS — K219 Gastro-esophageal reflux disease without esophagitis: Secondary | ICD-10-CM | POA: Diagnosis not present

## 2016-05-22 DIAGNOSIS — Z1231 Encounter for screening mammogram for malignant neoplasm of breast: Secondary | ICD-10-CM | POA: Diagnosis not present

## 2016-06-05 ENCOUNTER — Other Ambulatory Visit: Payer: Self-pay

## 2016-06-26 DIAGNOSIS — N3001 Acute cystitis with hematuria: Secondary | ICD-10-CM | POA: Diagnosis not present

## 2016-06-26 DIAGNOSIS — N3000 Acute cystitis without hematuria: Secondary | ICD-10-CM | POA: Diagnosis not present

## 2016-07-23 DIAGNOSIS — Z23 Encounter for immunization: Secondary | ICD-10-CM | POA: Diagnosis not present

## 2016-07-27 DIAGNOSIS — Z8619 Personal history of other infectious and parasitic diseases: Secondary | ICD-10-CM | POA: Diagnosis not present

## 2016-07-27 DIAGNOSIS — B0239 Other herpes zoster eye disease: Secondary | ICD-10-CM | POA: Diagnosis not present

## 2016-09-04 DIAGNOSIS — L570 Actinic keratosis: Secondary | ICD-10-CM | POA: Diagnosis not present

## 2016-10-03 DIAGNOSIS — M810 Age-related osteoporosis without current pathological fracture: Secondary | ICD-10-CM | POA: Diagnosis not present

## 2016-10-19 DIAGNOSIS — H353131 Nonexudative age-related macular degeneration, bilateral, early dry stage: Secondary | ICD-10-CM | POA: Diagnosis not present

## 2016-10-31 DIAGNOSIS — N3001 Acute cystitis with hematuria: Secondary | ICD-10-CM | POA: Diagnosis not present

## 2016-10-31 DIAGNOSIS — L821 Other seborrheic keratosis: Secondary | ICD-10-CM | POA: Diagnosis not present

## 2016-10-31 DIAGNOSIS — N3000 Acute cystitis without hematuria: Secondary | ICD-10-CM | POA: Diagnosis not present

## 2016-10-31 DIAGNOSIS — L570 Actinic keratosis: Secondary | ICD-10-CM | POA: Diagnosis not present

## 2016-10-31 DIAGNOSIS — L820 Inflamed seborrheic keratosis: Secondary | ICD-10-CM | POA: Diagnosis not present

## 2016-11-28 DIAGNOSIS — N3001 Acute cystitis with hematuria: Secondary | ICD-10-CM | POA: Diagnosis not present

## 2016-11-28 DIAGNOSIS — N3000 Acute cystitis without hematuria: Secondary | ICD-10-CM | POA: Diagnosis not present

## 2017-01-02 DIAGNOSIS — F411 Generalized anxiety disorder: Secondary | ICD-10-CM | POA: Diagnosis not present

## 2017-01-02 DIAGNOSIS — I10 Essential (primary) hypertension: Secondary | ICD-10-CM | POA: Diagnosis not present

## 2017-01-02 DIAGNOSIS — K219 Gastro-esophageal reflux disease without esophagitis: Secondary | ICD-10-CM | POA: Diagnosis not present

## 2017-01-02 DIAGNOSIS — E890 Postprocedural hypothyroidism: Secondary | ICD-10-CM | POA: Diagnosis not present

## 2017-01-28 DIAGNOSIS — M1711 Unilateral primary osteoarthritis, right knee: Secondary | ICD-10-CM | POA: Diagnosis not present

## 2017-03-21 DIAGNOSIS — N3001 Acute cystitis with hematuria: Secondary | ICD-10-CM | POA: Diagnosis not present

## 2017-04-24 DIAGNOSIS — M810 Age-related osteoporosis without current pathological fracture: Secondary | ICD-10-CM | POA: Diagnosis not present

## 2017-05-27 DIAGNOSIS — R1013 Epigastric pain: Secondary | ICD-10-CM | POA: Diagnosis not present

## 2017-05-27 DIAGNOSIS — K5900 Constipation, unspecified: Secondary | ICD-10-CM | POA: Diagnosis not present

## 2017-06-04 DIAGNOSIS — R072 Precordial pain: Secondary | ICD-10-CM | POA: Diagnosis not present

## 2017-06-04 DIAGNOSIS — I10 Essential (primary) hypertension: Secondary | ICD-10-CM | POA: Diagnosis not present

## 2017-06-04 DIAGNOSIS — E890 Postprocedural hypothyroidism: Secondary | ICD-10-CM | POA: Diagnosis not present

## 2017-06-04 DIAGNOSIS — K219 Gastro-esophageal reflux disease without esophagitis: Secondary | ICD-10-CM | POA: Diagnosis not present

## 2017-06-04 DIAGNOSIS — F411 Generalized anxiety disorder: Secondary | ICD-10-CM | POA: Diagnosis not present

## 2017-06-08 ENCOUNTER — Emergency Department (HOSPITAL_COMMUNITY): Payer: Medicare Other

## 2017-06-08 ENCOUNTER — Encounter (HOSPITAL_COMMUNITY): Payer: Self-pay | Admitting: Emergency Medicine

## 2017-06-08 ENCOUNTER — Inpatient Hospital Stay (HOSPITAL_COMMUNITY)
Admission: EM | Admit: 2017-06-08 | Discharge: 2017-06-12 | DRG: 247 | Disposition: A | Discharge status: Skilled Nursing Facility | Payer: Medicare Other | Attending: Internal Medicine | Admitting: Internal Medicine

## 2017-06-08 DIAGNOSIS — R52 Pain, unspecified: Secondary | ICD-10-CM | POA: Diagnosis not present

## 2017-06-08 DIAGNOSIS — Z833 Family history of diabetes mellitus: Secondary | ICD-10-CM

## 2017-06-08 DIAGNOSIS — I10 Essential (primary) hypertension: Secondary | ICD-10-CM | POA: Diagnosis not present

## 2017-06-08 DIAGNOSIS — Z955 Presence of coronary angioplasty implant and graft: Secondary | ICD-10-CM

## 2017-06-08 DIAGNOSIS — D649 Anemia, unspecified: Secondary | ICD-10-CM | POA: Diagnosis present

## 2017-06-08 DIAGNOSIS — N183 Chronic kidney disease, stage 3 (moderate): Secondary | ICD-10-CM | POA: Diagnosis not present

## 2017-06-08 DIAGNOSIS — F419 Anxiety disorder, unspecified: Secondary | ICD-10-CM | POA: Diagnosis not present

## 2017-06-08 DIAGNOSIS — Z79899 Other long term (current) drug therapy: Secondary | ICD-10-CM | POA: Diagnosis not present

## 2017-06-08 DIAGNOSIS — R079 Chest pain, unspecified: Secondary | ICD-10-CM | POA: Diagnosis not present

## 2017-06-08 DIAGNOSIS — K219 Gastro-esophageal reflux disease without esophagitis: Secondary | ICD-10-CM | POA: Diagnosis present

## 2017-06-08 DIAGNOSIS — Z888 Allergy status to other drugs, medicaments and biological substances status: Secondary | ICD-10-CM | POA: Diagnosis not present

## 2017-06-08 DIAGNOSIS — E871 Hypo-osmolality and hyponatremia: Secondary | ICD-10-CM | POA: Diagnosis present

## 2017-06-08 DIAGNOSIS — I214 Non-ST elevation (NSTEMI) myocardial infarction: Secondary | ICD-10-CM | POA: Diagnosis not present

## 2017-06-08 DIAGNOSIS — R0602 Shortness of breath: Secondary | ICD-10-CM | POA: Diagnosis not present

## 2017-06-08 DIAGNOSIS — Z880 Allergy status to penicillin: Secondary | ICD-10-CM | POA: Diagnosis not present

## 2017-06-08 DIAGNOSIS — Z7982 Long term (current) use of aspirin: Secondary | ICD-10-CM | POA: Diagnosis not present

## 2017-06-08 DIAGNOSIS — I2510 Atherosclerotic heart disease of native coronary artery without angina pectoris: Secondary | ICD-10-CM

## 2017-06-08 DIAGNOSIS — I130 Hypertensive heart and chronic kidney disease with heart failure and stage 1 through stage 4 chronic kidney disease, or unspecified chronic kidney disease: Secondary | ICD-10-CM | POA: Diagnosis not present

## 2017-06-08 DIAGNOSIS — I25750 Atherosclerosis of native coronary artery of transplanted heart with unstable angina: Secondary | ICD-10-CM | POA: Diagnosis not present

## 2017-06-08 DIAGNOSIS — I509 Heart failure, unspecified: Secondary | ICD-10-CM | POA: Diagnosis not present

## 2017-06-08 DIAGNOSIS — E785 Hyperlipidemia, unspecified: Secondary | ICD-10-CM

## 2017-06-08 DIAGNOSIS — I340 Nonrheumatic mitral (valve) insufficiency: Secondary | ICD-10-CM | POA: Diagnosis not present

## 2017-06-08 DIAGNOSIS — I361 Nonrheumatic tricuspid (valve) insufficiency: Secondary | ICD-10-CM | POA: Diagnosis not present

## 2017-06-08 DIAGNOSIS — Z8249 Family history of ischemic heart disease and other diseases of the circulatory system: Secondary | ICD-10-CM | POA: Diagnosis not present

## 2017-06-08 DIAGNOSIS — I213 ST elevation (STEMI) myocardial infarction of unspecified site: Secondary | ICD-10-CM | POA: Diagnosis not present

## 2017-06-08 DIAGNOSIS — I252 Old myocardial infarction: Secondary | ICD-10-CM

## 2017-06-08 DIAGNOSIS — I25110 Atherosclerotic heart disease of native coronary artery with unstable angina pectoris: Secondary | ICD-10-CM | POA: Diagnosis present

## 2017-06-08 HISTORY — DX: Disorder of thyroid, unspecified: E07.9

## 2017-06-08 HISTORY — DX: Essential (primary) hypertension: I10

## 2017-06-08 HISTORY — DX: Gastro-esophageal reflux disease without esophagitis: K21.9

## 2017-06-08 HISTORY — DX: Malignant neoplasm of unspecified site of unspecified female breast: C50.919

## 2017-06-08 HISTORY — DX: Pure hypercholesterolemia, unspecified: E78.00

## 2017-06-08 HISTORY — DX: Atherosclerotic heart disease of native coronary artery without angina pectoris: I25.10

## 2017-06-08 HISTORY — DX: Anxiety disorder, unspecified: F41.9

## 2017-06-08 LAB — BASIC METABOLIC PANEL
ANION GAP: 6 (ref 5–15)
BUN: 18 mg/dL (ref 6–20)
CO2: 27 mmol/L (ref 22–32)
Calcium: 8.9 mg/dL (ref 8.9–10.3)
Chloride: 95 mmol/L — ABNORMAL LOW (ref 101–111)
Creatinine, Ser: 1.26 mg/dL — ABNORMAL HIGH (ref 0.44–1.00)
GFR, EST AFRICAN AMERICAN: 42 mL/min — AB (ref >60)
GFR, EST NON AFRICAN AMERICAN: 36 mL/min — AB (ref >60)
Glucose, Bld: 106 mg/dL — ABNORMAL HIGH (ref 65–99)
POTASSIUM: 4.8 mmol/L (ref 3.5–5.1)
Sodium: 128 mmol/L — ABNORMAL LOW (ref 135–145)

## 2017-06-08 LAB — D-DIMER, QUANTITATIVE (NOT AT ARMC): D DIMER QUANT: 0.58 ug{FEU}/mL — AB (ref 0.00–0.50)

## 2017-06-08 LAB — CBC
HEMATOCRIT: 36.8 % (ref 36.0–46.0)
HEMOGLOBIN: 11.8 g/dL — AB (ref 12.0–15.0)
MCH: 28.4 pg (ref 26.0–34.0)
MCHC: 32.1 g/dL (ref 30.0–36.0)
MCV: 88.5 fL (ref 78.0–100.0)
Platelets: 175 10*3/uL (ref 150–400)
RBC: 4.16 MIL/uL (ref 3.87–5.11)
RDW: 14.4 % (ref 11.5–15.5)
WBC: 8 10*3/uL (ref 4.0–10.5)

## 2017-06-08 LAB — I-STAT TROPONIN, ED: TROPONIN I, POC: 0.98 ng/mL — AB (ref 0.00–0.08)

## 2017-06-08 LAB — PROTIME-INR
INR: 1.04
Prothrombin Time: 13.5 seconds (ref 11.4–15.2)

## 2017-06-08 LAB — BRAIN NATRIURETIC PEPTIDE: B Natriuretic Peptide: 262.7 pg/mL — ABNORMAL HIGH (ref 0.0–100.0)

## 2017-06-08 MED ORDER — ATORVASTATIN CALCIUM 80 MG PO TABS
80.0000 mg | ORAL_TABLET | Freq: Every day | ORAL | Status: DC
  Administered 2017-06-08 – 2017-06-12 (×5): 80 mg via ORAL
  Filled 2017-06-08 (×6): qty 1

## 2017-06-08 MED ORDER — ACETAMINOPHEN 325 MG PO TABS
650.0000 mg | ORAL_TABLET | ORAL | Status: DC | PRN
  Administered 2017-06-10 – 2017-06-12 (×2): 650 mg via ORAL
  Filled 2017-06-08 (×2): qty 2

## 2017-06-08 MED ORDER — FAMOTIDINE 20 MG PO TABS
10.0000 mg | ORAL_TABLET | Freq: Every day | ORAL | Status: DC
  Administered 2017-06-08 – 2017-06-12 (×5): 10 mg via ORAL
  Filled 2017-06-08 (×5): qty 1

## 2017-06-08 MED ORDER — ISOSORBIDE DINITRATE 10 MG PO TABS: 10.0000 mg | ORAL_TABLET | Freq: Three times a day (TID) | ORAL | Status: DC

## 2017-06-08 MED ORDER — NITROGLYCERIN IN D5W 200-5 MCG/ML-% IV SOLN
20.0000 ug/min | INTRAVENOUS | Status: DC
  Administered 2017-06-08 – 2017-06-10 (×2): 20 ug/min via INTRAVENOUS
  Filled 2017-06-08 (×2): qty 250

## 2017-06-08 MED ORDER — PREGABALIN 25 MG PO CAPS
50.0000 mg | ORAL_CAPSULE | Freq: Three times a day (TID) | ORAL | Status: DC
  Administered 2017-06-08 – 2017-06-12 (×12): 50 mg via ORAL
  Filled 2017-06-08: qty 1
  Filled 2017-06-08: qty 2
  Filled 2017-06-08 (×2): qty 1
  Filled 2017-06-08: qty 2
  Filled 2017-06-08 (×3): qty 1
  Filled 2017-06-08 (×2): qty 2
  Filled 2017-06-08 (×2): qty 1

## 2017-06-08 MED ORDER — LOSARTAN POTASSIUM 50 MG PO TABS
100.0000 mg | ORAL_TABLET | Freq: Every day | ORAL | Status: DC
  Administered 2017-06-09 – 2017-06-12 (×4): 100 mg via ORAL
  Filled 2017-06-08 (×4): qty 2

## 2017-06-08 MED ORDER — HEPARIN BOLUS VIA INFUSION
4000.0000 [IU] | Freq: Once | INTRAVENOUS | Status: AC
Start: 2017-06-08 — End: 2017-06-08
  Administered 2017-06-08: 4000 [IU] via INTRAVENOUS
  Filled 2017-06-08: qty 4000

## 2017-06-08 MED ORDER — HYDRALAZINE HCL 25 MG PO TABS: 25.0000 mg | ORAL_TABLET | Freq: Two times a day (BID) | ORAL | Status: DC

## 2017-06-08 MED ORDER — HEPARIN (PORCINE) IN NACL 100-0.45 UNIT/ML-% IJ SOLN
800.0000 [IU]/h | INTRAMUSCULAR | Status: DC
  Administered 2017-06-08: 1000 [IU]/h via INTRAVENOUS
  Administered 2017-06-09 – 2017-06-11 (×2): 800 [IU]/h via INTRAVENOUS
  Filled 2017-06-08 (×3): qty 250

## 2017-06-08 MED ORDER — NITROGLYCERIN 0.4 MG SL SUBL
0.4000 mg | SUBLINGUAL_TABLET | SUBLINGUAL | Status: DC | PRN
  Administered 2017-06-08: 0.4 mg via SUBLINGUAL
  Filled 2017-06-08: qty 1

## 2017-06-08 MED ORDER — LEVOTHYROXINE SODIUM 100 MCG PO TABS
100.0000 ug | ORAL_TABLET | Freq: Every day | ORAL | Status: DC
  Administered 2017-06-09 – 2017-06-12 (×4): 100 ug via ORAL
  Filled 2017-06-08 (×3): qty 1
  Filled 2017-06-08: qty 2

## 2017-06-08 MED ORDER — HYDRALAZINE HCL 25 MG PO TABS
25.0000 mg | ORAL_TABLET | Freq: Three times a day (TID) | ORAL | Status: DC
  Administered 2017-06-08 – 2017-06-12 (×9): 25 mg via ORAL
  Filled 2017-06-08 (×10): qty 1

## 2017-06-08 MED ORDER — ASPIRIN EC 81 MG PO TBEC: 81.0000 mg | DELAYED_RELEASE_TABLET | Freq: Every day | ORAL | Status: DC

## 2017-06-08 MED ORDER — CARVEDILOL 6.25 MG PO TABS
6.2500 mg | ORAL_TABLET | Freq: Two times a day (BID) | ORAL | Status: DC
  Administered 2017-06-09 – 2017-06-10 (×3): 6.25 mg via ORAL
  Filled 2017-06-08 (×2): qty 1
  Filled 2017-06-08: qty 2

## 2017-06-08 MED ORDER — FUROSEMIDE 10 MG/ML IJ SOLN
40.0000 mg | Freq: Once | INTRAMUSCULAR | Status: DC
  Filled 2017-06-08 (×2): qty 4

## 2017-06-08 MED ORDER — ASPIRIN EC 81 MG PO TBEC
81.0000 mg | DELAYED_RELEASE_TABLET | Freq: Every day | ORAL | Status: DC
  Administered 2017-06-08 – 2017-06-12 (×5): 81 mg via ORAL
  Filled 2017-06-08 (×5): qty 1

## 2017-06-08 MED ORDER — ONDANSETRON HCL 4 MG/2ML IJ SOLN
4.0000 mg | Freq: Four times a day (QID) | INTRAMUSCULAR | Status: DC | PRN
  Administered 2017-06-10: 4 mg via INTRAVENOUS
  Filled 2017-06-08: qty 2

## 2017-06-08 MED ORDER — CITALOPRAM HYDROBROMIDE 20 MG PO TABS
10.0000 mg | ORAL_TABLET | Freq: Every day | ORAL | Status: DC
  Administered 2017-06-08 – 2017-06-11 (×4): 10 mg via ORAL
  Filled 2017-06-08 (×4): qty 1

## 2017-06-08 NOTE — ED Notes (Addendum)
Pt had some shortness of breath with exertion when assisted to bathroom in wheelchair. Pt also reports return of cp.

## 2017-06-08 NOTE — Progress Notes (Signed)
ANTICOAGULATION CONSULT NOTE - Initial Consult  Pharmacy Consult for Heparin Indication: chest pain/ACS  Allergies  Allergen Reactions  . Ciprofloxacin Hcl   . Penicillins     Has patient had a PCN reaction causing immediate rash, facial/tongue/throat swelling, SOB or lightheadedness with hypotension: No Has patient had a PCN reaction causing severe rash involving mucus membranes or skin necrosis: No Has patient had a PCN reaction that required hospitalization: No Has patient had a PCN reaction occurring within the last 10 years: No If all of the above answers are "NO", then may proceed with Cephalosporin use.    Patient Measurements: Weight: 168 lb 3.2 oz (76.3 kg) Heparin Dosing Weight: 76 kg   Vital Signs: Temp: 98.4 F (36.9 C) (09/01 1718) BP: 208/64 (09/01 1815) Pulse Rate: 65 (09/01 1815)  Labs:  Recent Labs  06/08/17 1722  HGB 11.8*  HCT 36.8  PLT 175  CREATININE 1.26*    CrCl cannot be calculated (Unknown ideal weight.).   Medical History: Past Medical History:  Diagnosis Date  . Anxiety   . GERD (gastroesophageal reflux disease)   . High cholesterol   . Hypertension   . Thyroid disease     Medications:   (Not in a hospital admission)  Assessment: 77 YOF with ACS to start IV heparin. Initial troponin is 0.98. Hgb 11.8. Plt wnl. Patient is not on any anticoagulation at home   Goal of Therapy:  Heparin level 0.3-0.7 units/ml Monitor platelets by anticoagulation protocol: Yes   Plan:  -Heparin 4000 units IV bolus followed by heparin 1000 units/hr infusion -F/u 8 hr HL -Monitor daily HL, CBC and s/s of bleeding  Albertina Parr, PharmD., BCPS Clinical Pharmacist Pager [phone]

## 2017-06-08 NOTE — ED Notes (Signed)
Per [place] [facility], pt from home, c/o chest pain and bilateral arm pain, seen her PCP and a GI doctor for same issue without any difinitive dx. Pt hx of HTN. Pt states her doctor says its anxiety and they started her on anxiety meds. Pt denies CP at this time, states she has a slight headache. Pt given 1 nitro by ems which resolved pain. Pt took 324 asa at home. EKG unremarkable. Given 4 zofran by ems.

## 2017-06-08 NOTE — ED Provider Notes (Signed)
Tonya Cruz Provider Note   CSN: [id number] Arrival date & time: 06/08/17  1709     History   Chief Complaint Chief Complaint  Patient presents with  . Chest Pain    HPI Tonya Cruz is a 83 y.o. female.  HPI   83 year old female history of hypertension, hypercholesterolemia, GERD, and anxiety presents today complaining of -2 weeks of chest pain. She describes the pain as beginning in the left upper arm and radiating across the chest and then becoming substernal in nature.Over the past 4 days she has been having chest pain up to 4 times per day. These episodes generally last for about 5 minutes. She is unable to tell me anything that causes them to occur or relieving factors. She denies any similar symptoms in the past. She denies any history of coronary artery disease. She states that she was seen by a gastroenterologist one half weeks ago and was told they were not sure why she was having the pain. She also states that her primary care doctor thought she might be having anxiety. Today the pain became severe. She states that it is the worst pain she had ever had. She called 911 and was instructed to take aspirin. She was transported here via [facility]. She received one sublingual nitroglycerin and route and the pain resolved after that. She is currently pain-free. She does endorse having some intermittent dyspnea but is unable to tell me if this is related to the times when she has chest pain. She also states that she is having difficulty laying down flat.on review of systems she endorses that she has had some right lower extremity swelling. She denies any history of DVT, PE, immobilization, long trips.  Past Medical History:  Diagnosis Date  . Anxiety   . GERD (gastroesophageal reflux disease)   . High cholesterol   . Hypertension   . Thyroid disease     There are no active problems to display for this patient.   Past Surgical History:  Procedure Laterality  Date  . BREAST SURGERY    . roto cuff      OB History    No data available       Home Medications    Prior to Admission medications   Medication Sig Start Date End Date Taking? Authorizing Provider  aspirin EC 81 MG tablet Take 81 mg by mouth daily.   Yes [provider]  carvedilol (COREG) 6.25 MG tablet Take 6.25 mg by mouth 2 (two) times daily with a meal.  04/04/17  Yes [provider]  citalopram (CELEXA) 10 MG tablet Take 10 mg by mouth at bedtime. 06/04/17  Yes [provider]  cyanocobalamin (,VITAMIN B-12,) 1000 MCG/ML injection Inject 1,000 mLs into the muscle every 30 (thirty) days. 04/04/17  Yes [provider]  denosumab (PROLIA) 60 MG/ML SOLN injection Inject 60 mg into the skin every 6 (six) months. Administer in upper arm, thigh, or abdomen   Yes [provider]  hydrALAZINE (APRESOLINE) 25 MG tablet Take 25 mg by mouth 2 (two) times daily. 05/16/17  Yes [provider]  levothyroxine (SYNTHROID, LEVOTHROID) 100 MCG tablet Take 100 mcg by mouth daily. 04/04/17  Yes [provider]  LORazepam (ATIVAN) 1 MG tablet Take 1 mg by mouth at bedtime. 05/16/17  Yes [provider]  losartan (COZAAR) 100 MG tablet Take 100 mg by mouth daily. 05/16/17  Yes [provider]  pregabalin (LYRICA) 50 MG capsule Take 50  mg by mouth 3 (three) times daily.   Yes [provider]  ranitidine (ZANTAC) 150 MG tablet Take 150 mg by mouth 2 (two) times daily. 05/27/17  Yes [provider]    Family History No family history on file.  Social History Social History  Substance Use Topics  . Smoking status: Not on file  . Smokeless tobacco: Not on file  . Alcohol use No     Allergies   Ciprofloxacin hcl and Penicillins   Review of Systems Review of Systems  All other systems reviewed and are negative.    Physical Exam Updated Vital Signs BP (!) 187/77   Pulse 67   Temp 98.4 F (36.9 C)    Resp 20   SpO2 100%   Physical Exam  Constitutional: She is oriented to person, place, and time. She appears well-developed and well-nourished. No distress.  HENT:  Head: Normocephalic and atraumatic.  Right Ear: External ear normal.  Left Ear: External ear normal.  Eyes: Pupils are equal, round, and reactive to light. Conjunctivae and EOM are normal.  Neck: Normal range of motion. Neck supple.  Cardiovascular: Normal rate, regular rhythm and normal heart sounds.   Pulmonary/Chest: Effort normal and breath sounds normal.  Abdominal: Soft. Bowel sounds are normal.  Musculoskeletal: Normal range of motion. She exhibits edema.  Right lower extremities edema greater than left lower extremity edema with some erythema of the right lower extremity not well delineated with mild warmth  Neurological: She is alert and oriented to person, place, and time.  Skin: Skin is warm and dry. Capillary refill takes less than 2 seconds.  Psychiatric: She has a normal mood and affect.  Nursing note and vitals reviewed.    ED Treatments / Results  Labs (all labs ordered are listed, but only abnormal results are displayed) Labs Reviewed  BASIC METABOLIC PANEL - Abnormal; Notable for the following:       Result Value   Sodium 128 (*)    Chloride 95 (*)    Glucose, Bld 106 (*)    Creatinine, Ser 1.26 (*)    GFR calc non Af Amer 36 (*)    GFR calc Af Amer 42 (*)    All other components within normal limits  CBC - Abnormal; Notable for the following:    Hemoglobin 11.8 (*)    All other components within normal limits  I-STAT TROPONIN, ED - Abnormal; Notable for the following:    Troponin i, poc 0.98 (*)    All other components within normal limits  PROTIME-INR  D-DIMER, QUANTITATIVE (NOT AT [facility])    EKG  EKG Interpretation  Date/Time:  Saturday June 08 2017 17:14:14 EDT Ventricular Rate:  66 PR Interval:    QRS Duration: 90 QT Interval:  392 QTC Calculation: 411 R Axis:   64 Text  Interpretation:  Sinus rhythm Nonspecific ST depression Confirmed by Pattricia Boss [phone]) on 06/08/2017 6:06:34 PM       Radiology Dg Chest 2 View  Result Date: 06/08/2017 CLINICAL DATA:  Chest pain and shortness of breath for 2 weeks EXAM: CHEST  2 VIEW COMPARISON:  Feb 23, 2014 FINDINGS: The heart size and mediastinal contours are within normal limits. There is no focal infiltrate, pulmonary edema, or pleural effusion. The visualized skeletal structures are stable. IMPRESSION: No active cardiopulmonary disease. Electronically Signed   By: Abelardo Diesel M.D.   On: 06/08/2017 17:50    Procedures Procedures (including critical care time)  Medications Ordered in ED  Medications - No data to display   Initial Impression / Assessment and Plan / ED Course  I have reviewed the triage vital signs and the nursing notes.  Pertinent labs & imaging results that were available during my care of the patient were reviewed by me and considered in my medical decision making (see chart for details).     1-chest pain with an NSTEMI here with troponin elevated. Patient is currently pain-free. She received aspirin prior to arrival 324 mg. Discussed with Dr. Emilio Aspen and he will see for cardiology and plans admission Will heparinize 2-hypertension patient reports taking her regular medications.  Current bp 187/77 3- hyponatremia 4- renal insufficience- 1.26- no old to compare 5- anemia-mild unknown baseline Final Clinical Impressions(s) / ED Diagnoses   Final diagnoses:  NSTEMI (non-ST elevated myocardial infarction) [facility])    New Prescriptions New Prescriptions   No medications on file     Pattricia Boss, MD 06/08/17 2023

## 2017-06-08 NOTE — H&P (Signed)
CARDIOLOGY H&P  HPI: Tonya Cruz is a 83 y.o. female w/ HTN p/w NSTEMI.  Patient reports approximately 1 month of intermittent chest pain/pressure starting in the arms and radiating across the anterior chest. She is minimally active, however does live by herself and is independent of ADL's/IADL's. Her chest discomfort is not clearly related to exertion and frequently comes on while she is at rest. She saw her primary care doctor about this and was told that is was possibly related to anxiety. She was started on an anxiolytic medication. She additionally a gastroenterologist for possible GI etiologies for her pain. On the day of admission the patient states that her pain became so sever that she couldn't take it anymore and thus came to the hospital. She had never had pain this badly before.  Notably the patient also has developed worsening shortness of breath and orthopnea over the past few weeks. She typically would lay nearly flat in bed at night, however she required more and more pillows over several weeks and lately has been sleeping upright in a chair.   In the ED, the patient was found to be markedly hypertensive w/ SBP's in the 200's. Her ECG revealed sinus rhythm with ST depressions most prominently in the lateral precordium. A troponin was found to be elevated at 0.98. She was given a full dose aspirin, 1 sublingual nitroglycerin, and started on a heparin drip. Her pain resolved with the nitro tablet, though she did develop a headache from it.   Review of Systems:     Cardiac Review of Systems: {Y] = yes [ ]  = no  Chest Pain [ Y  ]  Resting SOB [ Y  ] Exertional SOB  [ Y ]  Orthopnea [ Y ]   Pedal Edema [   ]    Palpitations [  ] Syncope  [  ]   Presyncope [   ]  General Review of Systems: [Y] = yes [  ]=no Constitional: recent weight change [  ]; anorexia [  ]; fatigue [  ]; nausea [  ]; night sweats [  ]; fever [  ]; or chills [  ];                                                                      Dental: poor dentition[  ];   Eye : blurred vision [  ]; diplopia [   ]; vision changes [  ];  Amaurosis fugax[  ]; Resp: cough [  ];  wheezing[  ];  hemoptysis[  ]; shortness of breath[  ]; paroxysmal nocturnal dyspnea[  ]; dyspnea on exertion[  ]; or orthopnea[  ];  GI:  gallstones[  ], vomiting[  ];  dysphagia[  ]; melena[  ];  hematochezia [  ]; heartburn[  ];   GU: kidney stones [  ]; hematuria[  ];   dysuria [  ];  nocturia[  ];               Skin: rash [  ], swelling[  ];, hair loss[  ];  peripheral edema[  ];  or itching[  ]; Musculosketetal: myalgias[  ];  joint swelling[  ];  joint erythema[  ];  joint pain[  ];  back pain[  ];  Heme/Lymph: bruising[  ];  bleeding[  ];  anemia[  ];  Neuro: TIA[  ];  headaches[  ];  stroke[  ];  vertigo[  ];  seizures[  ];   paresthesias[  ];  difficulty walking[  ];  Psych:depression[  ]; anxiety[  ];  Endocrine: diabetes[  ];  thyroid dysfunction[  ];  Other:  Past Medical History:  Diagnosis Date  . Anxiety   . GERD (gastroesophageal reflux disease)   . High cholesterol   . Hypertension   . Thyroid disease     Prior to Admission medications   Medication Sig Start Date End Date Taking? Authorizing Provider  aspirin EC 81 MG tablet Take 81 mg by mouth daily.   Yes [provider]  carvedilol (COREG) 6.25 MG tablet Take 6.25 mg by mouth 2 (two) times daily with a meal.  04/04/17  Yes [provider]  citalopram (CELEXA) 10 MG tablet Take 10 mg by mouth at bedtime. 06/04/17  Yes [provider]  cyanocobalamin (,VITAMIN B-12,) 1000 MCG/ML injection Inject 1,000 mLs into the muscle every 30 (thirty) days. 04/04/17  Yes [provider]  denosumab (PROLIA) 60 MG/ML SOLN injection Inject 60 mg into the skin every 6 (six) months. Administer in upper arm, thigh, or abdomen   Yes [provider]  hydrALAZINE (APRESOLINE) 25 MG tablet Take 25 mg by mouth 2 (two) times daily. 05/16/17  Yes  [provider]  levothyroxine (SYNTHROID, LEVOTHROID) 100 MCG tablet Take 100 mcg by mouth daily. 04/04/17  Yes [provider]  LORazepam (ATIVAN) 1 MG tablet Take 1 mg by mouth at bedtime. 05/16/17  Yes [provider]  losartan (COZAAR) 100 MG tablet Take 100 mg by mouth daily. 05/16/17  Yes [provider]  pregabalin (LYRICA) 50 MG capsule Take 50 mg by mouth 3 (three) times daily.   Yes [provider]  ranitidine (ZANTAC) 150 MG tablet Take 150 mg by mouth 2 (two) times daily. 05/27/17  Yes [provider]     Allergies  Allergen Reactions  . Ciprofloxacin Hcl   . Penicillins     Has patient had a PCN reaction causing immediate rash, facial/tongue/throat swelling, SOB or lightheadedness with hypotension: No Has patient had a PCN reaction causing severe rash involving mucus membranes or skin necrosis: No Has patient had a PCN reaction that required hospitalization: No Has patient had a PCN reaction occurring within the last 10 years: No If all of the above answers are "NO", then may proceed with Cephalosporin use.    Social History   Social History  . Marital status: Married    Spouse name: N/A  . Number of children: N/A  . Years of education: N/A   Occupational History  . Not on file.   Social History Main Topics  . Smoking status: Not on file  . Smokeless tobacco: Not on file  . Alcohol use No  . Drug use: No  . Sexual activity: Not on file   Other Topics Concern  . Not on file   Social History Narrative  . No narrative on file    Family History: mother had an MI and diabetes; father died from cerebral aneurysm   PHYSICAL EXAM: Vitals:   06/08/17 1812 06/08/17 1815  BP: (!) 187/77 (!) 208/64  Pulse: 67 65  Resp: 20 15  Temp:    SpO2: 100% 96%   General:  Well  appearing, very pleasant. No respiratory difficulty HEENT: normal Neck: supple. JVD to ~ 14cm H2O. Carotids 2+ bilat; no bruits. No  lymphadenopathy or thryomegaly appreciated. Cor: PMI nondisplaced. Regular rate & rhythm. +S3. No rubs or murmurs. Lungs: clear Abdomen: soft, nontender, nondistended. No hepatosplenomegaly. No bruits or masses. Good bowel sounds. Extremities: no cyanosis, clubbing, rash, edema Neuro: alert & oriented x 3, cranial nerves grossly intact. moves all 4 extremities w/o difficulty. Affect pleasant.  ECG: NSR, ST depressions throughout lateral precordium and limb leads  Results for orders placed or performed during the hospital encounter of 06/08/17 (from the past 24 hour(s))  Basic metabolic panel     Status: Abnormal   Collection Time: 06/08/17  5:22 PM  Result Value Ref Range   Sodium 128 (L) 135 - 145 mmol/L   Potassium 4.8 3.5 - 5.1 mmol/L   Chloride 95 (L) 101 - 111 mmol/L   CO2 27 22 - 32 mmol/L   Glucose, Bld 106 (H) 65 - 99 mg/dL   BUN 18 6 - 20 mg/dL   Creatinine, Ser 1.26 (H) 0.44 - 1.00 mg/dL   Calcium 8.9 8.9 - 10.3 mg/dL   GFR calc non Af Amer 36 (L) >60 mL/min   GFR calc Af Amer 42 (L) >60 mL/min   Anion gap 6 5 - 15  CBC     Status: Abnormal   Collection Time: 06/08/17  5:22 PM  Result Value Ref Range   WBC 8.0 4.0 - 10.5 K/uL   RBC 4.16 3.87 - 5.11 MIL/uL   Hemoglobin 11.8 (L) 12.0 - 15.0 g/dL   HCT 36.8 36.0 - 46.0 %   MCV 88.5 78.0 - 100.0 fL   MCH 28.4 26.0 - 34.0 pg   MCHC 32.1 30.0 - 36.0 g/dL   RDW 14.4 11.5 - 15.5 %   Platelets 175 150 - 400 K/uL  I-stat troponin, ED     Status: Abnormal   Collection Time: 06/08/17  5:36 PM  Result Value Ref Range   Troponin i, poc 0.98 (HH) 0.00 - 0.08 ng/mL   Comment NOTIFIED PHYSICIAN    Comment 3           Dg Chest 2 View  Result Date: 06/08/2017 CLINICAL DATA:  Chest pain and shortness of breath for 2 weeks EXAM: CHEST  2 VIEW COMPARISON:  Feb 23, 2014 FINDINGS: The heart size and mediastinal contours are within normal limits. There is no focal infiltrate, pulmonary edema, or pleural effusion. The visualized skeletal  structures are stable. IMPRESSION: No active cardiopulmonary disease. Electronically Signed   By: Abelardo Diesel M.D.   On: 06/08/2017 17:50   ASSESSMENT: KEIAIRA DONLAN is a 83 y.o. female w/ HTN p/w NSTEMI.  PLAN/DISCUSSION:  #) NSTEMI: crescendo angina over the past month, now with acute coronary syndrome - repeat troponin and ECG q6h x 2 - check lipids, A1c - TTE - ASA 81mg  daily - heparin drip for ACS per pharmacy protocol - atorvastatin 80mg  QHS - cont home losartan 100mg  QD - start nitro drip (more for BP than for chest pain as she is currently chest pain free) - defer P2Y12 until after cath  #) Hypertension:  - cont home carvedilol for now, could possibly increase dose but HR's on the lower side (50's to 60's in NSR) - increase home hydralazine to 25 mg TID - start nitro drip as per above, can transition to Isordil tomorrow - diuresis as per below  #) Volume overload: most likely  sub-acute heart failure in the setting of crescendo angina and now acute coronary syndrome. - TTE as per above - check BNP - lasix 40mg  IV x1  Code status confirmed FULL CODE with the patient  Lanna Poche, MD Cardiology fellow, PGY-5

## 2017-06-08 NOTE — ED Notes (Signed)
Pt to xray

## 2017-06-08 NOTE — ED Notes (Signed)
Dr. Jeanell Sparrow aware of pt high blood pressure, no new orders at this time.

## 2017-06-09 ENCOUNTER — Inpatient Hospital Stay (HOSPITAL_COMMUNITY): Payer: Medicare Other

## 2017-06-09 DIAGNOSIS — I10 Essential (primary) hypertension: Secondary | ICD-10-CM

## 2017-06-09 DIAGNOSIS — I214 Non-ST elevation (NSTEMI) myocardial infarction: Principal | ICD-10-CM

## 2017-06-09 DIAGNOSIS — I340 Nonrheumatic mitral (valve) insufficiency: Secondary | ICD-10-CM

## 2017-06-09 DIAGNOSIS — I361 Nonrheumatic tricuspid (valve) insufficiency: Secondary | ICD-10-CM

## 2017-06-09 LAB — LIPID PANEL
CHOL/HDL RATIO: 2.8 ratio
CHOLESTEROL: 112 mg/dL (ref 0–200)
HDL: 40 mg/dL — AB (ref >40)
LDL Cholesterol: 52 mg/dL (ref 0–99)
Triglycerides: 99 mg/dL (ref <150)
VLDL: 20 mg/dL (ref 0–40)

## 2017-06-09 LAB — HEPARIN LEVEL (UNFRACTIONATED)
HEPARIN UNFRACTIONATED: 0.7 [IU]/mL (ref 0.30–0.70)
HEPARIN UNFRACTIONATED: 0.52 [IU]/mL (ref 0.30–0.70)
Heparin Unfractionated: 0.89 IU/mL — ABNORMAL HIGH (ref 0.30–0.70)

## 2017-06-09 LAB — CBC
HCT: 33.1 % — ABNORMAL LOW (ref 36.0–46.0)
Hemoglobin: 10.9 g/dL — ABNORMAL LOW (ref 12.0–15.0)
MCH: 28.8 pg (ref 26.0–34.0)
MCHC: 32.9 g/dL (ref 30.0–36.0)
MCV: 87.3 fL (ref 78.0–100.0)
PLATELETS: 161 10*3/uL (ref 150–400)
RBC: 3.79 MIL/uL — AB (ref 3.87–5.11)
RDW: 14.4 % (ref 11.5–15.5)
WBC: 6.8 10*3/uL (ref 4.0–10.5)

## 2017-06-09 LAB — HEMOGLOBIN A1C
Hgb A1c MFr Bld: 4.9 % (ref 4.8–5.6)
MEAN PLASMA GLUCOSE: 93.93 mg/dL

## 2017-06-09 LAB — TROPONIN I
Troponin I: 0.79 ng/mL (ref <0.03)
Troponin I: 0.67 ng/mL (ref <0.03)

## 2017-06-09 LAB — BASIC METABOLIC PANEL
Anion gap: 5 (ref 5–15)
BUN: 16 mg/dL (ref 6–20)
CHLORIDE: 100 mmol/L — AB (ref 101–111)
CO2: 27 mmol/L (ref 22–32)
CREATININE: 1.23 mg/dL — AB (ref 0.44–1.00)
Calcium: 8.5 mg/dL — ABNORMAL LOW (ref 8.9–10.3)
GFR calc non Af Amer: 37 mL/min — ABNORMAL LOW (ref >60)
GFR, EST AFRICAN AMERICAN: 43 mL/min — AB (ref >60)
Glucose, Bld: 84 mg/dL (ref 65–99)
Potassium: 4.3 mmol/L (ref 3.5–5.1)
Sodium: 132 mmol/L — ABNORMAL LOW (ref 135–145)

## 2017-06-09 LAB — ECHOCARDIOGRAM COMPLETE
Height: 65 in
Weight: 2696 oz

## 2017-06-09 MED ORDER — MORPHINE SULFATE (PF) 4 MG/ML IV SOLN
4.0000 mg | Freq: Once | INTRAVENOUS | Status: AC
  Administered 2017-06-09: 4 mg via INTRAVENOUS
  Filled 2017-06-09: qty 1

## 2017-06-09 MED ORDER — ALUM & MAG HYDROXIDE-SIMETH 200-200-20 MG/5ML PO SUSP
30.0000 mL | ORAL | Status: DC | PRN
  Administered 2017-06-09 – 2017-06-10 (×3): 30 mL via ORAL
  Filled 2017-06-09 (×3): qty 30

## 2017-06-09 NOTE — Plan of Care (Signed)
Problem: Pain Managment: Goal: General experience of comfort will improve Outcome: Progressing Patient c/o burning in her chest which resolved spontaneously.  Continues to deny pain.

## 2017-06-09 NOTE — Progress Notes (Signed)
ANTICOAGULATION CONSULT NOTE - Follow Up Consult  Pharmacy Consult for Heparin  Indication: chest pain/ACS  Patient Measurements: Height: 5\' 5"  (165.1 cm) (Pt reported) Weight: 168 lb 8 oz (76.4 kg) IBW/kg (Calculated) : 57  Heparin dosing weight: 72.8kg  Vital Signs: BP: 114/94 (09/02 1712) Pulse Rate: 71 (09/02 1712)  Labs:  Recent Labs  06/08/17 1722 06/09/17 0400 06/09/17 0855 06/09/17 1248 06/09/17 1321 06/09/17 1942  HGB 11.8* 10.9*  --   --   --   --   HCT 36.8 33.1*  --   --   --   --   PLT 175 161  --   --   --   --   LABPROT 13.5  --   --   --   --   --   INR 1.04  --   --   --   --   --   HEPARINUNFRC  --  0.70  --  0.89*  --  0.52  CREATININE 1.26* 1.23*  --   --   --   --   TROPONINI  --   --  0.79*  --  0.67*  --     Estimated Creatinine Clearance: 30.5 mL/min (A) (by C-G formula based on SCr of 1.23 mg/dL (H)).  Assessment: 83 y/o F on heparin for NSTEMI, cath planned for Tuesday unless urgently needed sooner. HL is now therapeutic on heparin 800 units/hr. RN reports some blood in the IV line. She will clean and reapply IV site dressing.   Goal of Therapy:  Heparin level 0.3-0.7 units/ml Monitor platelets by anticoagulation protocol: Yes   Plan:  -Continue heparin at 800 units/hr -Recheck HL with AM labs  -Daily HL, monitor for s/sx of bleeding   Albertina Parr, PharmD., BCPS Clinical Pharmacist Pager [phone]

## 2017-06-09 NOTE — Progress Notes (Signed)
  Echocardiogram 2D Echocardiogram has been performed.  Tonya Cruz 06/09/2017, 3:43 PM

## 2017-06-09 NOTE — Progress Notes (Signed)
Progress Note  Patient Name: Tonya Cruz Date of Encounter: 06/09/2017  Primary Cardiologist: new  Subjective   Doing well today, the patient  SOB.   CP is currently resolved.   No new concerns  Inpatient Medications    Scheduled Meds: . aspirin EC  81 mg Oral Daily  . atorvastatin  80 mg Oral q1800  . carvedilol  6.25 mg Oral BID WC  . citalopram  10 mg Oral QHS  . famotidine  10 mg Oral Daily  . furosemide  40 mg Intravenous Once  . hydrALAZINE  25 mg Oral Q8H  . levothyroxine  100 mcg Oral QAC breakfast  . losartan  100 mg Oral Daily  . pregabalin  50 mg Oral TID   Continuous Infusions: . heparin 950 Units/hr (06/09/17 0621)  . nitroGLYCERIN 20 mcg/min (06/08/17 2313)   PRN Meds: acetaminophen, nitroGLYCERIN, ondansetron (ZOFRAN) IV   Vital Signs    Vitals:   06/09/17 0311 06/09/17 0350 06/09/17 0621 06/09/17 0813  BP: (!) 120/46  (!) 126/51 (!) 124/56  Pulse: (!) 58   62  Resp: 20   17  Temp: (!) 97.5 F (36.4 C)     TempSrc: Oral     SpO2: 95%   98%  Weight:  168 lb 8 oz (76.4 kg)      Intake/Output Summary (Last 24 hours) at 06/09/17 1254 Last data filed at 06/09/17 0813  Gross per 24 hour  Intake           184.24 ml  Output              450 ml  Net          -265.76 ml   Filed Weights   06/08/17 1858 06/09/17 0350  Weight: 168 lb 3.2 oz (76.3 kg) 168 lb 8 oz (76.4 kg)    Telemetry    Sinus rhythm - Personally Reviewed  Physical Exam   GEN- The patient is well appearing, alert and oriented x 3 today.   Head- normocephalic, atraumatic Eyes-  Sclera clear, conjunctiva pink Ears- hearing intact Oropharynx- clear Neck- supple, Lungs- Clear to ausculation bilaterally, normal work of breathing Heart- Regular rate and rhythm  GI- soft, NT, ND, + BS Extremities- no clubbing, cyanosis, or edema  MS- no significant deformity or atrophy Skin- no rash or lesion Psych- euthymic mood, full affect Neuro- strength and sensation are  intact   Labs    Chemistry Recent Labs Lab 06/08/17 1722 06/09/17 0400  NA 128* 132*  K 4.8 4.3  CL 95* 100*  CO2 27 27  GLUCOSE 106* 84  BUN 18 16  CREATININE 1.26* 1.23*  CALCIUM 8.9 8.5*  GFRNONAA 36* 37*  GFRAA 42* 43*  ANIONGAP 6 5     Hematology Recent Labs Lab 06/08/17 1722 06/09/17 0400  WBC 8.0 6.8  RBC 4.16 3.79*  HGB 11.8* 10.9*  HCT 36.8 33.1*  MCV 88.5 87.3  MCH 28.4 28.8  MCHC 32.1 32.9  RDW 14.4 14.4  PLT 175 161    Cardiac Enzymes Recent Labs Lab 06/09/17 0855  TROPONINI 0.79*    Recent Labs Lab 06/08/17 1736  TROPIPOC 0.98*       Patient Profile     83 y.o. female admitted with HTN and NSTEMI  Assessment & Plan    1. NSTEMI Symptoms worrisome for unstable plaque.  Continue IV heparin and nitro Would plan cath on Tuesday If symptoms worsen, may require more urgent cath. Continue  medicine optimization in the interim.  2. HTN Stable No change required today  3. Volume overload Echo is pending  4. Hyponatremia Will follow closely   Thompson Grayer MD, [facility] 06/09/2017 12:54 PM

## 2017-06-09 NOTE — Progress Notes (Signed)
ANTICOAGULATION CONSULT NOTE - Follow Up Consult  Pharmacy Consult for Heparin  Indication: chest pain/ACS  Patient Measurements: Weight: 168 lb 8 oz (76.4 kg)  Heparin dosing weight: 72.8kg  Vital Signs: Temp: 97.5 F (36.4 C) (09/02 0311) Temp Source: Oral (09/02 0311) BP: 124/56 (09/02 0813) Pulse Rate: 62 (09/02 0813)  Labs:  Recent Labs  06/08/17 1722 06/09/17 0400 06/09/17 0855 06/09/17 1248  HGB 11.8* 10.9*  --   --   HCT 36.8 33.1*  --   --   PLT 175 161  --   --   LABPROT 13.5  --   --   --   INR 1.04  --   --   --   HEPARINUNFRC  --  0.70  --  0.89*  CREATININE 1.26* 1.23*  --   --   TROPONINI  --   --  0.79*  --     CrCl cannot be calculated (Unknown ideal weight.).  Assessment: 83 y/o F on heparin for NSTEMI, cath planned for Tuesday unless urgently needed sooner. HL returned supratherapeutic at 0.89, will decrease the drip ~2units/kg/hr. CBC stable, no bleeding.  Goal of Therapy:  Heparin level 0.3-0.7 units/ml Monitor platelets by anticoagulation protocol: Yes   Plan:  -Dec heparin to 800 units/hr -Recheck HL at 2000 -Daily HL, monitor for s/sx of bleeding   Patterson Hammersmith PharmD PGY1 Pharmacy Practice Resident 06/09/2017 2:07 PM Pager: [phone]

## 2017-06-09 NOTE — Progress Notes (Signed)
ANTICOAGULATION CONSULT NOTE - Follow Up Consult  Pharmacy Consult for Heparin  Indication: chest pain/ACS  Allergies  Allergen Reactions  . Ciprofloxacin Hcl   . Penicillins     Has patient had a PCN reaction causing immediate rash, facial/tongue/throat swelling, SOB or lightheadedness with hypotension: No Has patient had a PCN reaction causing severe rash involving mucus membranes or skin necrosis: No Has patient had a PCN reaction that required hospitalization: No Has patient had a PCN reaction occurring within the last 10 years: No If all of the above answers are "NO", then may proceed with Cephalosporin use.    Patient Measurements: Weight: 168 lb 8 oz (76.4 kg)  Vital Signs: Temp: 97.5 F (36.4 C) (09/02 0311) Temp Source: Oral (09/02 0311) BP: 120/46 (09/02 0311) Pulse Rate: 58 (09/02 0311)  Labs:  Recent Labs  06/08/17 1722 06/09/17 0400  HGB 11.8* 10.9*  HCT 36.8 33.1*  PLT 175 161  LABPROT 13.5  --   INR 1.04  --   HEPARINUNFRC  --  0.70  CREATININE 1.26*  --     CrCl cannot be calculated (Unknown ideal weight.).  Assessment: 83 y/o F on heparin for NSTEMI, heparin level is at upper end other therapeutic range  Goal of Therapy:  Heparin level 0.3-0.7 units/ml Monitor platelets by anticoagulation protocol: Yes   Plan:  -Dec heparin slightly to 950 units/hr to prevent elevated heparin level -1300 HL  Narda Bonds 06/09/2017,4:44 AM

## 2017-06-10 ENCOUNTER — Encounter (HOSPITAL_COMMUNITY): Payer: Self-pay

## 2017-06-10 DIAGNOSIS — I25110 Atherosclerotic heart disease of native coronary artery with unstable angina pectoris: Secondary | ICD-10-CM

## 2017-06-10 LAB — CBC
HCT: 33.5 % — ABNORMAL LOW (ref 36.0–46.0)
Hemoglobin: 10.8 g/dL — ABNORMAL LOW (ref 12.0–15.0)
MCH: 29.3 pg (ref 26.0–34.0)
MCHC: 32.2 g/dL (ref 30.0–36.0)
MCV: 90.8 fL (ref 78.0–100.0)
Platelets: 161 10*3/uL (ref 150–400)
RBC: 3.69 MIL/uL — ABNORMAL LOW (ref 3.87–5.11)
RDW: 14.6 % (ref 11.5–15.5)
WBC: 8.4 10*3/uL (ref 4.0–10.5)

## 2017-06-10 LAB — HEPARIN LEVEL (UNFRACTIONATED): HEPARIN UNFRACTIONATED: 0.57 [IU]/mL (ref 0.30–0.70)

## 2017-06-10 MED ORDER — TIROFIBAN HCL IV 12.5 MG/250 ML
0.0750 ug/kg/min | INTRAVENOUS | Status: DC
  Administered 2017-06-10: 0.075 ug/kg/min via INTRAVENOUS
  Filled 2017-06-10: qty 250

## 2017-06-10 MED ORDER — SODIUM CHLORIDE 0.9% FLUSH
3.0000 mL | Freq: Two times a day (BID) | INTRAVENOUS | Status: DC
  Administered 2017-06-10 – 2017-06-11 (×2): 3 mL via INTRAVENOUS

## 2017-06-10 MED ORDER — TIROFIBAN (AGGRASTAT) BOLUS VIA INFUSION
25.0000 ug/kg | Freq: Once | INTRAVENOUS | Status: AC
  Administered 2017-06-10: 1902.5 ug via INTRAVENOUS
  Filled 2017-06-10: qty 39

## 2017-06-10 MED ORDER — NITROGLYCERIN IN D5W 200-5 MCG/ML-% IV SOLN: 0.0000 ug/min | INTRAVENOUS | Status: DC

## 2017-06-10 MED ORDER — SODIUM CHLORIDE 0.9 % IV SOLN: 250.0000 mL | INTRAVENOUS | Status: DC | PRN

## 2017-06-10 MED ORDER — SODIUM CHLORIDE 0.9% FLUSH
3.0000 mL | INTRAVENOUS | Status: DC | PRN
Start: 2017-06-10 — End: 2017-06-11

## 2017-06-10 MED ORDER — SODIUM CHLORIDE 0.9 % WEIGHT BASED INFUSION
0.7500 mL/kg/h | INTRAVENOUS | Status: DC
  Administered 2017-06-10: 0.75 mL/kg/h via INTRAVENOUS

## 2017-06-10 MED ORDER — ASPIRIN 81 MG PO CHEW
81.0000 mg | CHEWABLE_TABLET | ORAL | Status: AC
  Administered 2017-06-11: 81 mg via ORAL
  Filled 2017-06-10: qty 1

## 2017-06-10 MED ORDER — MORPHINE SULFATE (PF) 4 MG/ML IV SOLN
4.0000 mg | Freq: Once | INTRAVENOUS | Status: AC
  Administered 2017-06-10: 4 mg via INTRAVENOUS
  Filled 2017-06-10: qty 1

## 2017-06-10 MED ORDER — CARVEDILOL 12.5 MG PO TABS
12.5000 mg | ORAL_TABLET | Freq: Two times a day (BID) | ORAL | Status: DC
  Administered 2017-06-10 – 2017-06-11 (×2): 12.5 mg via ORAL
  Filled 2017-06-10 (×2): qty 1

## 2017-06-10 MED ORDER — MORPHINE SULFATE (PF) 2 MG/ML IV SOLN
2.0000 mg | INTRAVENOUS | Status: DC | PRN
  Administered 2017-06-11: 2 mg via INTRAVENOUS
  Filled 2017-06-10: qty 1

## 2017-06-10 NOTE — Progress Notes (Signed)
Progress Note  Patient Name: Tonya Cruz Date of Encounter: 06/10/2017  Primary Cardiologist: Allred  Subjective   83 year old female who lives independently but has poor mobility presented with a one-month history of recurring episodes of chest burning radiating into both arms and progressive shortness of breath. She was admitted on 06/08/2017 with recurring episodes of chest pain. IV nitroglycerin has been started with improvement. This morning she has had severe recurrent chest discomfort associated with diffuse ST depression as well as AVR ST elevation. She has decreasing pain at this time. Cardiac markers are mildly elevated at 0.97.  Inpatient Medications    Scheduled Meds: . aspirin EC  81 mg Oral Daily  . atorvastatin  80 mg Oral q1800  . carvedilol  6.25 mg Oral BID WC  . citalopram  10 mg Oral QHS  . famotidine  10 mg Oral Daily  . furosemide  40 mg Intravenous Once  . hydrALAZINE  25 mg Oral Q8H  . levothyroxine  100 mcg Oral QAC breakfast  . losartan  100 mg Oral Daily  .  morphine injection  4 mg Intravenous Once  . pregabalin  50 mg Oral TID   Continuous Infusions: . heparin 800 Units/hr (06/09/17 1631)  . nitroGLYCERIN 20 mcg/min (06/10/17 0350)   PRN Meds: acetaminophen, alum & mag hydroxide-simeth, nitroGLYCERIN, ondansetron (ZOFRAN) IV   Vital Signs    Vitals:   06/09/17 1712 06/09/17 2157 06/10/17 0358 06/10/17 0523  BP: (!) 114/94 (!) 154/61 (!) 112/45 (!) 106/49  Pulse: 71 75 64   Resp: 19 (!) 27 20   Temp:  98.6 F (37 C) 98.5 F (36.9 C)   TempSrc:      SpO2: 98% 99% 100%   Weight:   167 lb 12.8 oz (76.1 kg)   Height:        Intake/Output Summary (Last 24 hours) at 06/10/17 1001 Last data filed at 06/10/17 0900  Gross per 24 hour  Intake           646.77 ml  Output             1500 ml  Net          -853.23 ml   Filed Weights   06/08/17 1858 06/09/17 0350 06/10/17 0358  Weight: 168 lb 3.2 oz (76.3 kg) 168 lb 8 oz (76.4 kg) 167 lb  12.8 oz (76.1 kg)    Telemetry    Sinus rhythm with PVCs. - Personally Reviewed  ECG    Most recent EKG during pain on 06/10/2017 demonstrates AVR ST elevation and diffuse ST segment depression in precordial and inferior leads. Sinus rhythm. When compared to prior tracings during this admission, ST segment depression is more severe when compared to 06/09/2017 but similar to all others. - Personally Reviewed  Physical Exam  Elderly but appears Bridgett Larsson and than stated age of 17. Somewhat distress currently because of chest discomfort on though diminishing in severity. GEN: No acute distress.   Neck: No JVD Cardiac: RRR, no murmurs, rubs. There is an audible S4 gallop.  Respiratory: Clear to auscultation bilaterally. GI: Soft, nontender, non-distended  MS: No edema; No deformity. Neuro:  Nonfocal  Psych: Normal affect   Labs    Chemistry Recent Labs Lab 06/08/17 1722 06/09/17 0400  NA 128* 132*  K 4.8 4.3  CL 95* 100*  CO2 27 27  GLUCOSE 106* 84  BUN 18 16  CREATININE 1.26* 1.23*  CALCIUM 8.9 8.5*  GFRNONAA 36* 37*  GFRAA 42* 43*  ANIONGAP 6 5     Hematology Recent Labs Lab 06/08/17 1722 06/09/17 0400 06/10/17 0209  WBC 8.0 6.8 8.4  RBC 4.16 3.79* 3.69*  HGB 11.8* 10.9* 10.8*  HCT 36.8 33.1* 33.5*  MCV 88.5 87.3 90.8  MCH 28.4 28.8 29.3  MCHC 32.1 32.9 32.2  RDW 14.4 14.4 14.6  PLT 175 161 161    Cardiac Enzymes Recent Labs Lab 06/09/17 0855 06/09/17 1321  TROPONINI 0.79* 0.67*    Recent Labs Lab 06/08/17 1736  TROPIPOC 0.98*     BNP Recent Labs Lab 06/08/17 1722  BNP 262.7*     DDimer  Recent Labs Lab 06/08/17 1722  DDIMER 0.58*     Radiology    Dg Chest 2 View  Result Date: 06/08/2017 CLINICAL DATA:  Chest pain and shortness of breath for 2 weeks EXAM: CHEST  2 VIEW COMPARISON:  Feb 23, 2014 FINDINGS: The heart size and mediastinal contours are within normal limits. There is no focal infiltrate, pulmonary edema, or pleural effusion. The  visualized skeletal structures are stable. IMPRESSION: No active cardiopulmonary disease. Electronically Signed   By: Abelardo Diesel M.D.   On: 06/08/2017 17:50    Cardiac Studies   Echocardiography 06/09/2017 Study Conclusions  - Left ventricle: The cavity size was normal. Wall thickness was   normal. Systolic function was normal. The estimated ejection   fraction was in the range of 60% to 65%. Wall motion was normal;   there were no regional wall motion abnormalities. Doppler   parameters are consistent with pseudonormal left ventricular   relaxation (grade 2 diastolic dysfunction). The E/A ratio is   >1.5. The E/e&' ratio is >15, suggesting elevated LV filling   pressure. - Aortic valve: Trileaflet. Sclerosis without stenosis. There was   mild regurgitation. - Mitral valve: Mildly thickened leaflets . There was mild   regurgitation. - Left atrium: The atrium was mildly dilated. - Tricuspid valve: There was mild regurgitation. - Pulmonary arteries: PA peak pressure: 52 mm Hg (S). - Inferior vena cava: The vessel was normal in size. The   respirophasic diameter changes were in the normal range (>= 50%),   consistent with normal central venous pressure.  Impressions:  - LVEF 60-65%, normal wall thickness, normal wall motion, grade 2   DD with elevated LV filling pressure, aortic valve sclerosis with   mild AI, mild MR, mild LAE, mild TR, RVSP 52 mmHg, normal IVC.  Patient Profile     83 y.o. female NSTEACS associated with diffuse ST segment depression and ST elevation in aVR. Cardiac markers are mildly elevated since admission. Risk factors include hypertension, well treated hyperlipidemia, and age.  Assessment & Plan    1. Non-ST elevation acute coronary syndrome. Suspect multivessel CAD in this elderly patient. EKG appearance suggests multivessel disease/left main equivalent or left main disease. Recurring episodes of chest discomfort at rest require morphine and  nitroglycerin titration. May need coronary angiography sooner rather than later. We'll attempt to fully optimize medical therapy and plan for diagnostic angiography tomorrow if she stabilizes. Add antiplatelet therapy in the form of Aggrastat. 2. Lipids are not elevated but agree with high intensity statin therapy. 3. Hypertension, essential. Uptitrate nitroglycerin and beta blocker to better control both recurrent anginal episodes and hypertension. 4. CKD stage III  Signed, Sinclair Grooms, MD  06/10/2017, 10:01 AM

## 2017-06-10 NOTE — Progress Notes (Signed)
ANTICOAGULATION CONSULT NOTE - Follow Up Consult  Pharmacy Consult for Heparin  Indication: chest pain/ACS  Patient Measurements: Height: 5\' 5"  (165.1 cm) (Pt reported) Weight: 167 lb 12.8 oz (76.1 kg) IBW/kg (Calculated) : 57  Heparin dosing weight: 72.8kg  Vital Signs: Temp: 98.5 F (36.9 C) (09/03 0358) BP: 106/49 (09/03 0523) Pulse Rate: 64 (09/03 0358)  Labs:  Recent Labs  06/08/17 1722  06/09/17 0400 06/09/17 0855 06/09/17 1248 06/09/17 1321 06/09/17 1942 06/10/17 0209  HGB 11.8*  --  10.9*  --   --   --   --  10.8*  HCT 36.8  --  33.1*  --   --   --   --  33.5*  PLT 175  --  161  --   --   --   --  161  LABPROT 13.5  --   --   --   --   --   --   --   INR 1.04  --   --   --   --   --   --   --   HEPARINUNFRC  --   < > 0.70  --  0.89*  --  0.52 0.57  CREATININE 1.26*  --  1.23*  --   --   --   --   --   TROPONINI  --   --   --  0.79*  --  0.67*  --   --   < > = values in this interval not displayed.  Estimated Creatinine Clearance: 30.4 mL/min (A) (by C-G formula based on SCr of 1.23 mg/dL (H)).  Assessment: 83 y/o F on heparin for NSTEMI, cath planned for Tuesday unless urgently needed sooner. HL was therapeutic on recheck this morning at 0.57, will continue current drip rate. CBC is stable and the troponin is trending down.  Goal of Therapy:  Heparin level 0.3-0.7 units/ml Monitor platelets by anticoagulation protocol: Yes   Plan:  -Continue heparin at 800 units/hr -Daily HL, CBC, monitor for s/sx of bleeding   Patterson Hammersmith PharmD PGY1 Pharmacy Practice Resident 06/10/2017 8:41 AM Pager: [phone]

## 2017-06-10 NOTE — Plan of Care (Signed)
Problem: Pain Managment: Goal: General experience of comfort will improve Outcome: Progressing Pt had an earlier episode of chest pain this morning which was relieved by nitroglycerin drip and morphine. Pt in no apparent distress at this time.

## 2017-06-10 NOTE — Progress Notes (Signed)
Dr. Tamala Julian paged regarding pt's complaints of chest pain 10/10. Nitroglycerin was titrated to 69mcg/min. Pt currently moaning and PA will order morphine. MD is on his way to patient's room.

## 2017-06-11 ENCOUNTER — Encounter (HOSPITAL_COMMUNITY): Payer: Self-pay | Admitting: Cardiovascular Disease

## 2017-06-11 ENCOUNTER — Encounter (HOSPITAL_COMMUNITY)
Admission: EM | Disposition: A | Discharge status: Skilled Nursing Facility | Payer: Self-pay | Source: Home / Self Care | Attending: Internal Medicine

## 2017-06-11 DIAGNOSIS — I2510 Atherosclerotic heart disease of native coronary artery without angina pectoris: Secondary | ICD-10-CM

## 2017-06-11 DIAGNOSIS — I214 Non-ST elevation (NSTEMI) myocardial infarction: Secondary | ICD-10-CM

## 2017-06-11 DIAGNOSIS — N183 Chronic kidney disease, stage 3 (moderate): Secondary | ICD-10-CM

## 2017-06-11 HISTORY — DX: Non-ST elevation (NSTEMI) myocardial infarction: I21.4

## 2017-06-11 HISTORY — PX: LEFT HEART CATH AND CORONARY ANGIOGRAPHY: CATH118249

## 2017-06-11 HISTORY — PX: CORONARY STENT INTERVENTION: CATH118234

## 2017-06-11 HISTORY — PX: CARDIAC CATHETERIZATION: SHX172

## 2017-06-11 LAB — BASIC METABOLIC PANEL
Anion gap: 6 (ref 5–15)
BUN: 18 mg/dL (ref 6–20)
CALCIUM: 7.5 mg/dL — AB (ref 8.9–10.3)
CO2: 21 mmol/L — ABNORMAL LOW (ref 22–32)
CREATININE: 1.17 mg/dL — AB (ref 0.44–1.00)
Chloride: 104 mmol/L (ref 101–111)
GFR, EST AFRICAN AMERICAN: 46 mL/min — AB (ref >60)
GFR, EST NON AFRICAN AMERICAN: 39 mL/min — AB (ref >60)
Glucose, Bld: 100 mg/dL — ABNORMAL HIGH (ref 65–99)
Potassium: 5.1 mmol/L (ref 3.5–5.1)
SODIUM: 131 mmol/L — AB (ref 135–145)

## 2017-06-11 LAB — CBC
HCT: 29.9 % — ABNORMAL LOW (ref 36.0–46.0)
HEMOGLOBIN: 9.4 g/dL — AB (ref 12.0–15.0)
MCH: 28.6 pg (ref 26.0–34.0)
MCHC: 31.4 g/dL (ref 30.0–36.0)
MCV: 90.9 fL (ref 78.0–100.0)
Platelets: 145 10*3/uL — ABNORMAL LOW (ref 150–400)
RBC: 3.29 MIL/uL — AB (ref 3.87–5.11)
RDW: 14.6 % (ref 11.5–15.5)
WBC: 6.6 10*3/uL (ref 4.0–10.5)

## 2017-06-11 LAB — HEPARIN LEVEL (UNFRACTIONATED): Heparin Unfractionated: 0.33 IU/mL (ref 0.30–0.70)

## 2017-06-11 LAB — BRAIN NATRIURETIC PEPTIDE: B NATRIURETIC PEPTIDE 5: 258.8 pg/mL — AB (ref 0.0–100.0)

## 2017-06-11 LAB — POCT ACTIVATED CLOTTING TIME: Activated Clotting Time: 362 seconds

## 2017-06-11 LAB — TROPONIN I: Troponin I: 0.45 ng/mL (ref <0.03)

## 2017-06-11 SURGERY — LEFT HEART CATH AND CORONARY ANGIOGRAPHY
Anesthesia: LOCAL

## 2017-06-11 MED ORDER — SODIUM CHLORIDE 0.9 % IV SOLN: 250.0000 mL | INTRAVENOUS | Status: DC | PRN

## 2017-06-11 MED ORDER — CLOPIDOGREL BISULFATE 300 MG PO TABS
ORAL_TABLET | ORAL | Status: AC
  Filled 2017-06-11: qty 1

## 2017-06-11 MED ORDER — HEART ATTACK BOUNCING BOOK
Freq: Once | Status: AC
  Administered 2017-06-11: 20:00:00
  Filled 2017-06-11: qty 1

## 2017-06-11 MED ORDER — CLOPIDOGREL BISULFATE 300 MG PO TABS
ORAL_TABLET | ORAL | Status: DC | PRN
  Administered 2017-06-11: 600 mg via ORAL

## 2017-06-11 MED ORDER — SODIUM CHLORIDE 0.9% FLUSH: 3.0000 mL | INTRAVENOUS | Status: DC | PRN

## 2017-06-11 MED ORDER — MIDAZOLAM HCL 2 MG/2ML IJ SOLN
INTRAMUSCULAR | Status: AC
  Filled 2017-06-11: qty 2

## 2017-06-11 MED ORDER — HEPARIN SODIUM (PORCINE) 1000 UNIT/ML IJ SOLN
INTRAMUSCULAR | Status: DC | PRN
  Administered 2017-06-11: 6000 [IU] via INTRAVENOUS

## 2017-06-11 MED ORDER — IOPAMIDOL (ISOVUE-370) INJECTION 76%
INTRAVENOUS | Status: AC
  Filled 2017-06-11: qty 50

## 2017-06-11 MED ORDER — HYDRALAZINE HCL 20 MG/ML IJ SOLN: 5.0000 mg | INTRAMUSCULAR | Status: AC | PRN

## 2017-06-11 MED ORDER — LABETALOL HCL 5 MG/ML IV SOLN: 10.0000 mg | INTRAVENOUS | Status: AC | PRN

## 2017-06-11 MED ORDER — ANGIOPLASTY BOOK
Freq: Once | Status: AC
  Administered 2017-06-11: 20:00:00
  Filled 2017-06-11: qty 1

## 2017-06-11 MED ORDER — CLOPIDOGREL BISULFATE 75 MG PO TABS
75.0000 mg | ORAL_TABLET | Freq: Every day | ORAL | Status: DC
  Administered 2017-06-12: 09:00:00 75 mg via ORAL
  Filled 2017-06-11: qty 1

## 2017-06-11 MED ORDER — HEPARIN SODIUM (PORCINE) 1000 UNIT/ML IJ SOLN
INTRAMUSCULAR | Status: AC
  Filled 2017-06-11: qty 1

## 2017-06-11 MED ORDER — HEPARIN (PORCINE) IN NACL 2-0.9 UNIT/ML-% IJ SOLN
INTRAMUSCULAR | Status: AC
  Filled 2017-06-11: qty 1000

## 2017-06-11 MED ORDER — MAGNESIUM HYDROXIDE 400 MG/5ML PO SUSP
30.0000 mL | Freq: Every day | ORAL | Status: DC | PRN
  Administered 2017-06-12: 09:00:00 30 mL via ORAL
  Filled 2017-06-11: qty 30

## 2017-06-11 MED ORDER — VERAPAMIL HCL 2.5 MG/ML IV SOLN
INTRAVENOUS | Status: AC
  Filled 2017-06-11: qty 2

## 2017-06-11 MED ORDER — MIDAZOLAM HCL 2 MG/2ML IJ SOLN
INTRAMUSCULAR | Status: DC | PRN
  Administered 2017-06-11: 0.5 mg via INTRAVENOUS

## 2017-06-11 MED ORDER — SODIUM CHLORIDE 0.9 % IV SOLN
INTRAVENOUS | Status: AC
  Administered 2017-06-11: 15:00:00 via INTRAVENOUS

## 2017-06-11 MED ORDER — IOPAMIDOL (ISOVUE-370) INJECTION 76%
INTRAVENOUS | Status: AC
  Filled 2017-06-11: qty 100

## 2017-06-11 MED ORDER — IOPAMIDOL (ISOVUE-370) INJECTION 76%
INTRAVENOUS | Status: DC | PRN
  Administered 2017-06-11: 100 mL via INTRA_ARTERIAL

## 2017-06-11 MED ORDER — SODIUM CHLORIDE 0.9% FLUSH
3.0000 mL | Freq: Two times a day (BID) | INTRAVENOUS | Status: DC
  Administered 2017-06-11 – 2017-06-12 (×2): 3 mL via INTRAVENOUS

## 2017-06-11 MED ORDER — HEPARIN (PORCINE) IN NACL 2-0.9 UNIT/ML-% IJ SOLN
INTRAMUSCULAR | Status: AC | PRN
  Administered 2017-06-11: 1000 mL

## 2017-06-11 MED ORDER — LIDOCAINE HCL (PF) 1 % IJ SOLN
INTRAMUSCULAR | Status: DC | PRN
  Administered 2017-06-11: 3 mL via INTRADERMAL

## 2017-06-11 MED ORDER — LIDOCAINE HCL 2 % IJ SOLN
INTRAMUSCULAR | Status: AC
  Filled 2017-06-11: qty 10

## 2017-06-11 MED ORDER — ACTIVE PARTNERSHIP FOR HEALTH OF YOUR HEART BOOK
Freq: Once | Status: DC
  Filled 2017-06-11: qty 1

## 2017-06-11 MED ORDER — VERAPAMIL HCL 2.5 MG/ML IV SOLN
INTRAVENOUS | Status: DC | PRN
  Administered 2017-06-11: 10 mL via INTRA_ARTERIAL

## 2017-06-11 SURGICAL SUPPLY — 17 items
BALLN SAPPHIRE 2.5X15 (BALLOONS) ×2
BALLN ~~LOC~~ EMERGE MR 3.0X12 (BALLOONS) ×2
BALLOON SAPPHIRE 2.5X15 (BALLOONS) IMPLANT
BALLOON ~~LOC~~ EMERGE MR 3.0X12 (BALLOONS) IMPLANT
CATH 5FR JL3.5 JR4 ANG PIG MP (CATHETERS) ×1 IMPLANT
CATH VISTA GUIDE 6FR XBLAD3.5 (CATHETERS) ×1 IMPLANT
DEVICE RAD COMP TR BAND LRG (VASCULAR PRODUCTS) ×1 IMPLANT
GLIDESHEATH SLEND SS 6F .021 (SHEATH) ×1 IMPLANT
GUIDEWIRE INQWIRE 1.5J.035X260 (WIRE) IMPLANT
INQWIRE 1.5J .035X260CM (WIRE) ×2
KIT ENCORE 26 ADVANTAGE (KITS) ×1 IMPLANT
KIT HEART LEFT (KITS) ×2 IMPLANT
PACK CARDIAC CATHETERIZATION (CUSTOM PROCEDURE TRAY) ×2 IMPLANT
STENT SYNERGY DES 3X16 (Permanent Stent) ×1 IMPLANT
TRANSDUCER W/STOPCOCK (MISCELLANEOUS) ×2 IMPLANT
TUBING CIL FLEX 10 FLL-RA (TUBING) ×2 IMPLANT
WIRE COUGAR XT STRL 190CM (WIRE) ×1 IMPLANT

## 2017-06-11 NOTE — Care Management Note (Addendum)
Case Management Note  Patient Details  Name: Tonya Cruz MRN: [id number] Date of Birth: October 29, 1923  Subjective/Objective:     From home alone, s/p coronary stent intervention, will be on plaivx and asa. Per RN, family wants patient to go to [facility], CSW aware per RN.                 Action/Plan:  NCM will follow for dc needs.   Expected Discharge Date:                  Expected Discharge Plan:     In-House Referral:     Discharge planning Services  CM Consult  Post Acute Care Choice:    Choice offered to:     DME Arranged:    DME Agency:     HH Arranged:    HH Agency:     Status of Service:  In process, will continue to follow  If discussed at Long Length of Stay Meetings, dates discussed:    Additional Comments:  Zenon Mayo, RN 06/11/2017, 3:26 PM

## 2017-06-11 NOTE — H&P (View-Only) (Signed)
Progress Note  Patient Name: Tonya Cruz Date of Encounter: 06/11/2017  Primary Cardiologist: Allred  Subjective   Quiet night. No laboratory data or EKG yet today. Currently on intense medical therapy including aspirin, Aggrastat, IV nitroglycerin, and escalated beta blocker therapy compared to baseline.  She currently denies chest discomfort and dyspnea. She is scheduled for coronary angiography later this morning.  Inpatient Medications    Scheduled Meds: . aspirin EC  81 mg Oral Daily  . atorvastatin  80 mg Oral q1800  . carvedilol  12.5 mg Oral BID WC  . citalopram  10 mg Oral QHS  . famotidine  10 mg Oral Daily  . furosemide  40 mg Intravenous Once  . hydrALAZINE  25 mg Oral Q8H  . levothyroxine  100 mcg Oral QAC breakfast  . losartan  100 mg Oral Daily  . pregabalin  50 mg Oral TID  . sodium chloride flush  3 mL Intravenous Q12H   Continuous Infusions: . sodium chloride    . sodium chloride 0.75 mL/kg/hr (06/10/17 2100)  . heparin 800 Units/hr (06/11/17 0225)  . nitroGLYCERIN 10 mcg/min (06/11/17 0530)   PRN Meds: sodium chloride, acetaminophen, alum & mag hydroxide-simeth, morphine injection, nitroGLYCERIN, ondansetron (ZOFRAN) IV, sodium chloride flush   Vital Signs    Vitals:   06/11/17 0233 06/11/17 0300 06/11/17 0524 06/11/17 0534  BP: (!) 87/44 (!) 98/46 (!) 91/38 (!) 112/54  Pulse: 60 69 (!) 58 63  Resp: 10 15 (!) 9 14  Temp:  98.1 F (36.7 C)    TempSrc:  Oral    SpO2: 99% 98% 98% 98%  Weight:  169 lb 5 oz (76.8 kg)    Height:        Intake/Output Summary (Last 24 hours) at 06/11/17 0936 Last data filed at 06/11/17 0802  Gross per 24 hour  Intake          1463.22 ml  Output             1200 ml  Net           263.22 ml   Filed Weights   06/09/17 0350 06/10/17 0358 06/11/17 0300  Weight: 168 lb 8 oz (76.4 kg) 167 lb 12.8 oz (76.1 kg) 169 lb 5 oz (76.8 kg)    Telemetry    Sinus rhythm without significant ectopy. - Personally  Reviewed  ECG    A new tracing is not yet available. - Personally Reviewed  Physical Exam  Appears gentleman stated age but somewhat frail consistent with age. GEN: No acute distress.   Neck: No JVD Cardiac: RRR, no murmurs, rub. An S4 gallop is audible.[name]  Respiratory: Clear to auscultation bilaterally. GI: Soft, nontender, non-distended  MS: No edema; No deformity. Neuro:  Nonfocal  Psych: Normal affect   Labs    Chemistry Recent Labs Lab 06/08/17 1722 06/09/17 0400  NA 128* 132*  K 4.8 4.3  CL 95* 100*  CO2 27 27  GLUCOSE 106* 84  BUN 18 16  CREATININE 1.26* 1.23*  CALCIUM 8.9 8.5*  GFRNONAA 36* 37*  GFRAA 42* 43*  ANIONGAP 6 5     Hematology Recent Labs Lab 06/09/17 0400 06/10/17 0209 06/11/17 0253  WBC 6.8 8.4 6.6  RBC 3.79* 3.69* 3.29*  HGB 10.9* 10.8* 9.4*  HCT 33.1* 33.5* 29.9*  MCV 87.3 90.8 90.9  MCH 28.8 29.3 28.6  MCHC 32.9 32.2 31.4  RDW 14.4 14.6 14.6  PLT 161 161 145*  Cardiac Enzymes Recent Labs Lab 06/09/17 0855 06/09/17 1321  TROPONINI 0.79* 0.67*    Recent Labs Lab 06/08/17 1736  TROPIPOC 0.98*     BNP Recent Labs Lab 06/08/17 1722  BNP 262.7*     DDimer  Recent Labs Lab 06/08/17 1722  DDIMER 0.58*     Radiology    No results found.  Cardiac Studies   No new cardiac data.  Echocardiography 06/09/2017:  - LVEF 60-65%, normal wall thickness, normal wall motion, grade 2 DD with elevated LV filling pressure, aortic valve sclerosis with mild AI, mild MR, mild LAE, mild TR, RVSP 52 mmHg, normal IVC.  Patient Profile     83 y.o. female  NSTEACS associated with diffuse ST segment depression and ST elevation in aVR. Cardiac markers are mildly elevated since admission. Risk factors include hypertension, well treated hyperlipidemia, and age.Suspected large territory of myocardium at risk based upon appearance of EKG.  Assessment & Plan    1. NSTEACS, currently on fully maximized ACS therapy. EKG pattern  during pain suggests a large territory of ischemic burden. At her age, probably not a candidate for CABG. Hopefully there will be interventional substrate. Continue Aggrastat, IV nitroglycerin, beta blocker therapy, high intensity statin therapy, and aspirin. 2. History of essential hypertension with relatively low blood pressures recently. This is related to medication titration. If we can resolve for ACS with PCI, therapy will be simplified. 3. Hyperlipidemia, currently on high-dose therapy 4. Stage III CKD, will recheck basic metabolic panel right to procedure.  Plan nothing by mouth, cath later today, hopefully amenable PCI targets as the patient is not a good surgical candidate at her age. EKG, troponin, and basic metabolic panel are ordered for this morning.  Signed, Sinclair Grooms, MD  06/11/2017, 9:36 AM

## 2017-06-11 NOTE — Progress Notes (Signed)
Pt picked up by transport for cath lab.  

## 2017-06-11 NOTE — Progress Notes (Signed)
Progress Note  Patient Name: Tonya Cruz Date of Encounter: 06/11/2017  Primary Cardiologist: Allred  Subjective   Quiet night. No laboratory data or EKG yet today. Currently on intense medical therapy including aspirin, Aggrastat, IV nitroglycerin, and escalated beta blocker therapy compared to baseline.  She currently denies chest discomfort and dyspnea. She is scheduled for coronary angiography later this morning.  Inpatient Medications    Scheduled Meds: . aspirin EC  81 mg Oral Daily  . atorvastatin  80 mg Oral q1800  . carvedilol  12.5 mg Oral BID WC  . citalopram  10 mg Oral QHS  . famotidine  10 mg Oral Daily  . furosemide  40 mg Intravenous Once  . hydrALAZINE  25 mg Oral Q8H  . levothyroxine  100 mcg Oral QAC breakfast  . losartan  100 mg Oral Daily  . pregabalin  50 mg Oral TID  . sodium chloride flush  3 mL Intravenous Q12H   Continuous Infusions: . sodium chloride    . sodium chloride 0.75 mL/kg/hr (06/10/17 2100)  . heparin 800 Units/hr (06/11/17 0225)  . nitroGLYCERIN 10 mcg/min (06/11/17 0530)   PRN Meds: sodium chloride, acetaminophen, alum & mag hydroxide-simeth, morphine injection, nitroGLYCERIN, ondansetron (ZOFRAN) IV, sodium chloride flush   Vital Signs    Vitals:   06/11/17 0233 06/11/17 0300 06/11/17 0524 06/11/17 0534  BP: (!) 87/44 (!) 98/46 (!) 91/38 (!) 112/54  Pulse: 60 69 (!) 58 63  Resp: 10 15 (!) 9 14  Temp:  98.1 F (36.7 C)    TempSrc:  Oral    SpO2: 99% 98% 98% 98%  Weight:  169 lb 5 oz (76.8 kg)    Height:        Intake/Output Summary (Last 24 hours) at 06/11/17 0936 Last data filed at 06/11/17 0802  Gross per 24 hour  Intake          1463.22 ml  Output             1200 ml  Net           263.22 ml   Filed Weights   06/09/17 0350 06/10/17 0358 06/11/17 0300  Weight: 168 lb 8 oz (76.4 kg) 167 lb 12.8 oz (76.1 kg) 169 lb 5 oz (76.8 kg)    Telemetry    Sinus rhythm without significant ectopy. - Personally  Reviewed  ECG    A new tracing is not yet available. - Personally Reviewed  Physical Exam  Appears gentleman stated age but somewhat frail consistent with age. GEN: No acute distress.   Neck: No JVD Cardiac: RRR, no murmurs, rub. An S4 gallop is audible.[name]  Respiratory: Clear to auscultation bilaterally. GI: Soft, nontender, non-distended  MS: No edema; No deformity. Neuro:  Nonfocal  Psych: Normal affect   Labs    Chemistry Recent Labs Lab 06/08/17 1722 06/09/17 0400  NA 128* 132*  K 4.8 4.3  CL 95* 100*  CO2 27 27  GLUCOSE 106* 84  BUN 18 16  CREATININE 1.26* 1.23*  CALCIUM 8.9 8.5*  GFRNONAA 36* 37*  GFRAA 42* 43*  ANIONGAP 6 5     Hematology Recent Labs Lab 06/09/17 0400 06/10/17 0209 06/11/17 0253  WBC 6.8 8.4 6.6  RBC 3.79* 3.69* 3.29*  HGB 10.9* 10.8* 9.4*  HCT 33.1* 33.5* 29.9*  MCV 87.3 90.8 90.9  MCH 28.8 29.3 28.6  MCHC 32.9 32.2 31.4  RDW 14.4 14.6 14.6  PLT 161 161 145*  Cardiac Enzymes Recent Labs Lab 06/09/17 0855 06/09/17 1321  TROPONINI 0.79* 0.67*    Recent Labs Lab 06/08/17 1736  TROPIPOC 0.98*     BNP Recent Labs Lab 06/08/17 1722  BNP 262.7*     DDimer  Recent Labs Lab 06/08/17 1722  DDIMER 0.58*     Radiology    No results found.  Cardiac Studies   No new cardiac data.  Echocardiography 06/09/2017:  - LVEF 60-65%, normal wall thickness, normal wall motion, grade 2 DD with elevated LV filling pressure, aortic valve sclerosis with mild AI, mild MR, mild LAE, mild TR, RVSP 52 mmHg, normal IVC.  Patient Profile     83 y.o. female  NSTEACS associated with diffuse ST segment depression and ST elevation in aVR. Cardiac markers are mildly elevated since admission. Risk factors include hypertension, well treated hyperlipidemia, and age.Suspected large territory of myocardium at risk based upon appearance of EKG.  Assessment & Plan    1. NSTEACS, currently on fully maximized ACS therapy. EKG pattern  during pain suggests a large territory of ischemic burden. At her age, probably not a candidate for CABG. Hopefully there will be interventional substrate. Continue Aggrastat, IV nitroglycerin, beta blocker therapy, high intensity statin therapy, and aspirin. 2. History of essential hypertension with relatively low blood pressures recently. This is related to medication titration. If we can resolve for ACS with PCI, therapy will be simplified. 3. Hyperlipidemia, currently on high-dose therapy 4. Stage III CKD, will recheck basic metabolic panel right to procedure.  Plan nothing by mouth, cath later today, hopefully amenable PCI targets as the patient is not a good surgical candidate at her age. EKG, troponin, and basic metabolic panel are ordered for this morning.  Signed, Sinclair Grooms, MD  06/11/2017, 9:36 AM

## 2017-06-11 NOTE — Interval H&P Note (Signed)
History and Physical Interval Note:  06/11/2017 1:10 PM  Tonya Cruz  has presented today for cardiac cath with the diagnosis of NSTEMI  The various methods of treatment have been discussed with the patient and family. After consideration of risks, benefits and other options for treatment, the patient has consented to  Procedure(s): LEFT HEART CATH AND CORONARY ANGIOGRAPHY (N/A) as a surgical intervention .  The patient's history has been reviewed, patient examined, no change in status, stable for surgery.  I have reviewed the patient's chart and labs.  Questions were answered to the patient's satisfaction.    Cath Lab Visit (complete for each Cath Lab visit)  Clinical Evaluation Leading to the Procedure:   ACS: Yes.    Non-ACS:    Anginal Classification: CCS IV  Anti-ischemic medical therapy: Minimal Therapy (1 class of medications)  Non-Invasive Test Results: No non-invasive testing performed  Prior CABG: No previous CABG         Lauree Chandler

## 2017-06-11 NOTE — Progress Notes (Signed)
ANTICOAGULATION CONSULT NOTE - Follow Up Consult  Pharmacy Consult for Heparin  Indication: chest pain/ACS  Patient Measurements: Height: 5\' 5"  (165.1 cm) (Pt reported) Weight: 169 lb 5 oz (76.8 kg) IBW/kg (Calculated) : 57  Heparin dosing weight: 72.8kg  Vital Signs: Temp: 98.1 F (36.7 C) (09/04 0300) Temp Source: Oral (09/04 0300) BP: 112/54 (09/04 0534) Pulse Rate: 63 (09/04 0534)  Labs:  Recent Labs  06/08/17 1722 06/09/17 0400 06/09/17 0855  06/09/17 1321 06/09/17 1942 06/10/17 0209 06/11/17 0253  HGB 11.8* 10.9*  --   --   --   --  10.8* 9.4*  HCT 36.8 33.1*  --   --   --   --  33.5* 29.9*  PLT 175 161  --   --   --   --  161 145*  LABPROT 13.5  --   --   --   --   --   --   --   INR 1.04  --   --   --   --   --   --   --   HEPARINUNFRC  --  0.70  --   < >  --  0.52 0.57 0.33  CREATININE 1.26* 1.23*  --   --   --   --   --   --   TROPONINI  --   --  0.79*  --  0.67*  --   --   --   < > = values in this interval not displayed.  Estimated Creatinine Clearance: 30.5 mL/min (A) (by C-G formula based on SCr of 1.23 mg/dL (H)).  Assessment: 83 y/o F on heparin for NSTEMI with cath planned for today. Heparin level is at goal   Goal of Therapy:  Heparin level 0.3-0.7 units/ml Monitor platelets by anticoagulation protocol: Yes   Plan:  -Continue heparin at 800 units/hr -Will follow plans post cath  Hildred Laser, Pharm D 06/11/2017 10:20 AM

## 2017-06-11 NOTE — Progress Notes (Signed)
TR BAND REMOVAL  LOCATION:    right radial  DEFLATED PER PROTOCOL:    Yes.    TIME BAND OFF / DRESSING APPLIED:    19:15   SITE UPON ARRIVAL:    Level 0  SITE AFTER BAND REMOVAL:    Level 1 Bruise  CIRCULATION SENSATION AND MOVEMENT:    Within Normal Limits   Yes.    COMMENTS:   Post TR band instructions given. Pt tolerated well.

## 2017-06-12 ENCOUNTER — Encounter (HOSPITAL_COMMUNITY): Payer: Self-pay | Admitting: Cardiology

## 2017-06-12 ENCOUNTER — Telehealth: Payer: Self-pay | Admitting: Physician Assistant

## 2017-06-12 DIAGNOSIS — E782 Mixed hyperlipidemia: Secondary | ICD-10-CM | POA: Diagnosis not present

## 2017-06-12 DIAGNOSIS — I214 Non-ST elevation (NSTEMI) myocardial infarction: Secondary | ICD-10-CM | POA: Diagnosis not present

## 2017-06-12 DIAGNOSIS — I2510 Atherosclerotic heart disease of native coronary artery without angina pectoris: Secondary | ICD-10-CM | POA: Diagnosis not present

## 2017-06-12 DIAGNOSIS — F419 Anxiety disorder, unspecified: Secondary | ICD-10-CM | POA: Diagnosis not present

## 2017-06-12 DIAGNOSIS — I25750 Atherosclerosis of native coronary artery of transplanted heart with unstable angina: Secondary | ICD-10-CM

## 2017-06-12 DIAGNOSIS — R5383 Other fatigue: Secondary | ICD-10-CM | POA: Diagnosis not present

## 2017-06-12 DIAGNOSIS — Z880 Allergy status to penicillin: Secondary | ICD-10-CM | POA: Diagnosis not present

## 2017-06-12 DIAGNOSIS — I10 Essential (primary) hypertension: Secondary | ICD-10-CM | POA: Diagnosis not present

## 2017-06-12 DIAGNOSIS — Z7982 Long term (current) use of aspirin: Secondary | ICD-10-CM | POA: Diagnosis not present

## 2017-06-12 DIAGNOSIS — I509 Heart failure, unspecified: Secondary | ICD-10-CM | POA: Diagnosis not present

## 2017-06-12 DIAGNOSIS — Z833 Family history of diabetes mellitus: Secondary | ICD-10-CM | POA: Diagnosis not present

## 2017-06-12 DIAGNOSIS — I213 ST elevation (STEMI) myocardial infarction of unspecified site: Secondary | ICD-10-CM | POA: Diagnosis not present

## 2017-06-12 DIAGNOSIS — I1310 Hypertensive heart and chronic kidney disease without heart failure, with stage 1 through stage 4 chronic kidney disease, or unspecified chronic kidney disease: Secondary | ICD-10-CM | POA: Diagnosis not present

## 2017-06-12 DIAGNOSIS — K219 Gastro-esophageal reflux disease without esophagitis: Secondary | ICD-10-CM | POA: Diagnosis not present

## 2017-06-12 DIAGNOSIS — E784 Other hyperlipidemia: Secondary | ICD-10-CM | POA: Diagnosis not present

## 2017-06-12 DIAGNOSIS — Z79899 Other long term (current) drug therapy: Secondary | ICD-10-CM | POA: Diagnosis not present

## 2017-06-12 DIAGNOSIS — Z23 Encounter for immunization: Secondary | ICD-10-CM | POA: Diagnosis not present

## 2017-06-12 DIAGNOSIS — Z888 Allergy status to other drugs, medicaments and biological substances status: Secondary | ICD-10-CM | POA: Diagnosis not present

## 2017-06-12 DIAGNOSIS — E785 Hyperlipidemia, unspecified: Secondary | ICD-10-CM

## 2017-06-12 DIAGNOSIS — E871 Hypo-osmolality and hyponatremia: Secondary | ICD-10-CM | POA: Diagnosis not present

## 2017-06-12 DIAGNOSIS — R52 Pain, unspecified: Secondary | ICD-10-CM | POA: Diagnosis not present

## 2017-06-12 DIAGNOSIS — R262 Difficulty in walking, not elsewhere classified: Secondary | ICD-10-CM | POA: Diagnosis not present

## 2017-06-12 DIAGNOSIS — I130 Hypertensive heart and chronic kidney disease with heart failure and stage 1 through stage 4 chronic kidney disease, or unspecified chronic kidney disease: Secondary | ICD-10-CM | POA: Diagnosis not present

## 2017-06-12 DIAGNOSIS — I25110 Atherosclerotic heart disease of native coronary artery with unstable angina pectoris: Secondary | ICD-10-CM | POA: Diagnosis not present

## 2017-06-12 DIAGNOSIS — Z8249 Family history of ischemic heart disease and other diseases of the circulatory system: Secondary | ICD-10-CM | POA: Diagnosis not present

## 2017-06-12 DIAGNOSIS — N183 Chronic kidney disease, stage 3 (moderate): Secondary | ICD-10-CM | POA: Diagnosis not present

## 2017-06-12 LAB — BASIC METABOLIC PANEL
Anion gap: 7 (ref 5–15)
BUN: 18 mg/dL (ref 6–20)
CALCIUM: 7.6 mg/dL — AB (ref 8.9–10.3)
CO2: 22 mmol/L (ref 22–32)
CREATININE: 1.23 mg/dL — AB (ref 0.44–1.00)
Chloride: 105 mmol/L (ref 101–111)
GFR calc Af Amer: 43 mL/min — ABNORMAL LOW (ref >60)
GFR, EST NON AFRICAN AMERICAN: 37 mL/min — AB (ref >60)
Glucose, Bld: 87 mg/dL (ref 65–99)
POTASSIUM: 4.6 mmol/L (ref 3.5–5.1)
SODIUM: 134 mmol/L — AB (ref 135–145)

## 2017-06-12 LAB — CBC
HEMATOCRIT: 33.1 % — AB (ref 36.0–46.0)
HEMOGLOBIN: 10.4 g/dL — AB (ref 12.0–15.0)
MCH: 28.6 pg (ref 26.0–34.0)
MCHC: 31.4 g/dL (ref 30.0–36.0)
MCV: 90.9 fL (ref 78.0–100.0)
Platelets: 130 10*3/uL — ABNORMAL LOW (ref 150–400)
RBC: 3.64 MIL/uL — AB (ref 3.87–5.11)
RDW: 14.7 % (ref 11.5–15.5)
WBC: 6 10*3/uL (ref 4.0–10.5)

## 2017-06-12 MED ORDER — ATORVASTATIN CALCIUM 80 MG PO TABS: 80.0000 mg | ORAL_TABLET | Freq: Every day | ORAL | 1 refills | Status: DC

## 2017-06-12 MED ORDER — HYDRALAZINE HCL 25 MG PO TABS: 25.0000 mg | ORAL_TABLET | Freq: Three times a day (TID) | ORAL | 2 refills | Status: DC

## 2017-06-12 MED ORDER — CLOPIDOGREL BISULFATE 75 MG PO TABS
75.0000 mg | ORAL_TABLET | Freq: Every day | ORAL | 3 refills | Status: DC
Start: 2017-06-13 — End: 2018-04-08

## 2017-06-12 MED ORDER — CARVEDILOL 12.5 MG PO TABS: 12.5000 mg | ORAL_TABLET | Freq: Two times a day (BID) | ORAL | 2 refills | Status: DC

## 2017-06-12 MED ORDER — NITROGLYCERIN 0.4 MG SL SUBL: 0.4000 mg | SUBLINGUAL_TABLET | SUBLINGUAL | 2 refills | Status: DC | PRN

## 2017-06-12 MED ORDER — CARVEDILOL 12.5 MG PO TABS
12.5000 mg | ORAL_TABLET | Freq: Two times a day (BID) | ORAL | Status: DC
  Administered 2017-06-12: 12.5 mg via ORAL
  Filled 2017-06-12: qty 1

## 2017-06-12 NOTE — Evaluation (Signed)
Physical Therapy Evaluation Patient Details Name: Tonya Cruz MRN: [id number] DOB: 1924/06/15 Today's Date: 06/12/2017   History of Present Illness  SHELETHA BOW is a 83 y.o. female w/ HTN p/w NSTEMI. PMH HTN, CKD III.  Clinical Impression  Patient presents with decreased endurance, balance and limited activity tolerance with generalized weakness.  Feel she will need SNF level rehab at d/c.  Currently min to minguard for mobility with SOB noted during short distance ambulation and even with RW throughout.  Will benefit from skilled PT to address issues and progress mobility during acute stay.    Follow Up Recommendations SNF;Supervision/Assistance - 24 hour (preference [facility] )    Equipment Recommendations  None recommended by PT    Recommendations for Other Services       Precautions / Restrictions Precautions Precautions: Fall Restrictions Weight Bearing Restrictions: Yes RUE Weight Bearing: Non weight bearing      Mobility  Bed Mobility               General bed mobility comments: up in chair  Transfers Overall transfer level: Needs assistance Equipment used: Rolling walker (2 wheeled) Transfers: Sit to/from Stand Sit to Stand: Min guard         General transfer comment: for balance with increased time and heavy UE support to stand, noted SOB right away after standing  Ambulation/Gait Ambulation/Gait assistance: Min guard Ambulation Distance (Feet): 90 Feet Assistive device: Rolling walker (2 wheeled) Gait Pattern/deviations: Step-through pattern;Trunk flexed;Decreased stride length     General Gait Details: slow pace, stops to rest several times during ambulation, SpO2 96% on RA, HR 79  Stairs            Wheelchair Mobility    Modified Rankin (Stroke Patients Only)       Balance Overall balance assessment: Needs assistance   Sitting balance-Leahy Scale: Good       Standing balance-Leahy Scale: Fair                                Pertinent Vitals/Pain Pain Assessment: No/denies pain    Home Living Family/patient expects to be discharged to:: Skilled nursing facility Living Arrangements: Alone   Type of Home: House Home Access: Stairs to enter   Entrance Stairs-Number of Steps: 1 Home Layout: One level;Able to live on main level with bedroom/bathroom;Laundry or work area in [place]: Environmental consultant - 2 wheels;Bedside commode;Shower seat;Grab bars - tub/shower Additional Comments: uses walker occasionally    Prior Function Level of Independence: Independent;Independent with assistive device(s);Needs assistance      ADL's / Homemaking Assistance Needed: has assist for heavy cleaning every 3 weeks; has lady 2 hours a day Mon, Tues, Thurs for transportation, getting groceries, appointments        Hand Dominance        Extremity/Trunk Assessment   Upper Extremity Assessment Upper Extremity Assessment: Generalized weakness    Lower Extremity Assessment Lower Extremity Assessment: LLE deficits/detail;RLE deficits/detail RLE Deficits / Details: AROM WFL, strength grossly 4/5 noted edema in lower leg and some mild tenderness with Homan's test LLE Deficits / Details: AROM WFL, strength grossly 4-/5       Communication   Communication: No difficulties  Cognition Arousal/Alertness: Awake/alert Behavior During Therapy: WFL for tasks assessed/performed Overall Cognitive Status: Within Functional Limits for tasks assessed  General Comments General comments (skin integrity, edema, etc.): Discussed endurance issues and possible need for activity modification even after STSNF    Exercises     Assessment/Plan    PT Assessment Patient needs continued PT services  PT Problem List Decreased strength;Decreased mobility;Decreased activity tolerance;Decreased balance;Decreased knowledge of precautions       PT Treatment  Interventions DME instruction;Therapeutic activities;Therapeutic exercise;Patient/family education;Gait training;Balance training;Functional mobility training    PT Goals (Current goals can be found in the Care Plan section)  Acute Rehab PT Goals Patient Stated Goal: To go to rehab prior to home PT Goal Formulation: With patient Time For Goal Achievement: 06/20/17 Potential to Achieve Goals: Good    Frequency Min 3X/week   Barriers to discharge Decreased caregiver support      Co-evaluation               AM-PAC PT "6 Clicks" Daily Activity  Outcome Measure Difficulty turning over in bed (including adjusting bedclothes, sheets and blankets)?: A Little Difficulty moving from lying on back to sitting on the side of the bed? : A Little Difficulty sitting down on and standing up from a chair with arms (e.g., wheelchair, bedside commode, etc,.)?: Unable Help needed moving to and from a bed to chair (including a wheelchair)?: A Little Help needed walking in hospital room?: A Little Help needed climbing 3-5 steps with a railing? : A Lot 6 Click Score: 15    End of Session Equipment Utilized During Treatment: Gait belt Activity Tolerance: Patient limited by fatigue Patient left: in chair;with call bell/phone within reach;with chair alarm set   PT Visit Diagnosis: Muscle weakness (generalized) (M62.81);Other abnormalities of gait and mobility (R26.89)    Time: 9244-6286 PT Time Calculation (min) (ACUTE ONLY): 26 min   Charges:   PT Evaluation $PT Eval Moderate Complexity: 1 Mod PT Treatments $Gait Training: 8-22 mins   PT G CodesMagda Kiel, [place] [phone] 06/12/2017   Reginia Naas 06/12/2017, 10:20 AM

## 2017-06-12 NOTE — Telephone Encounter (Signed)
New message      TOC appt made with Estella Husk on 06-24-17 per Ria Comment

## 2017-06-12 NOTE — Care Management Important Message (Signed)
Important Message  Patient Details  Name: Tonya Cruz MRN: [id number] Date of Birth: Jan 15, 1924   Medicare Important Message Given:  Yes    Kathrynne Kulinski 06/12/2017, 11:43 AM

## 2017-06-12 NOTE — Clinical Social Work Note (Signed)
Clinical Social Work Assessment  Patient Details  Name: Tonya Cruz MRN: [id number] Date of Birth: 12/31/1923  Date of referral:  06/11/17               Reason for consult:  Facility Placement                Permission sought to share information with:  Family Supports Permission granted to share information::  Yes, Verbal Permission Granted  Name::     Teena Irani - daughter and [name] - granddaughter  Agency::     Relationship::  Daughter and granddaughter  Contact Information:  Ms. Angela Cox [phone] and Ms. Waler [phone]  Housing/Transportation Living arrangements for the past 2 months:  [place] of Information:  Patient, Adult Children, Other (Comment Required) (Granddaughter Comptroller) Patient Interpreter Needed:  None Criminal Activity/Legal Involvement Pertinent to Current Situation/Hospitalization:  No - Comment as needed Significant Relationships:  Adult Children, Other Family Members Lives with:  Self Do you feel safe going back to the place where you live?  No (Patient agreeable to short-term rehab before returning home as she lives alone) Need for family participation in patient care:  Yes (Comment)  Care giving concerns:  Patient reported that she lives alone with some assistance from an LPN who comes 3 times per week for 2 hours, and a person who comes every 3 weeks to do the heavy cleaning. Mrs. Chancellor is aware of her need for extra assistance and is in agreement with short-term rehab.   Social Worker assessment / plan:  CSW visited patient and talked with her about her discharge disposition Ms. Wurzel was sitting up in a chair at bedside and when asked how she was doing replied pleasantly, "I'm tired".  Patient was friendly and open to talking with CSW regarding her discharge plan and her home life. Her facility preference is [facility] in [place] and when asked about a second choice replied that she would go home.   Mrs. Lagares shared  that she has been living alone for 8 years with some assistance and talked with CSW about her daily routine. Patient also shared that she does have family support from her daughters Collie Siad and Eustaquio Maize and her grandchildren.  CSW given permission to talk with granddaughter Raquel Sarna, who is a discharge planner at a hospital in [place].  Employment status:  Retired Health visitor, Managed Care PT Recommendations:  [place] / Referral to community resources:  [place] (CSW was provided with faciity preference by family)  Patient/Family's Response to care: Patient and family did not express any concerns regarding her care during hospitalization.  Patient/Family's Understanding of and Emotional Response to Diagnosis, Current Treatment, and Prognosis:  Mrs. Reagor and her family expressed awareness of her current medical condition and that skilled care at a facility will be beneficial before returning home.  Emotional Assessment Appearance:  Appears younger than stated age Attitude/Demeanor/Rapport:  Other (Appropriate) Affect (typically observed):  Pleasant, Appropriate Orientation:  Oriented to Self, Oriented to Place, Oriented to  Time, Oriented to Situation Alcohol / Substance use:  Tobacco Use, Alcohol Use, Illicit Drugs (Patient reported that she does not drink, smoke or use illicit drugs) Psych involvement (Current and /or in the community):  No (Comment)  Discharge Needs  Concerns to be addressed:  Discharge Planning Concerns Readmission within the last 30 days:  Yes Current discharge risk:  None Barriers to Discharge:  No Barriers Identified  Sable Feil, LCSW 06/12/2017, 12:51 PM

## 2017-06-12 NOTE — NC FL2 (Signed)
[place] LEVEL OF CARE SCREENING TOOL     IDENTIFICATION  Patient Name: Tonya Cruz Birthdate: 12/18/1923 Sex: female Admission Date (Current Location): 06/08/2017  [facility] and [place] Number:  Herbalist and Address:  [facility] [place], [place] [address], [place], [place] [address]      Provider Number: [id number]  Attending Physician Name and Address:  Genene Churn*  Relative Name and Phone Number:  Teena Irani - daughter; [phone]    Current Level of Care: Hospital Recommended Level of Care: [place] Prior Approval Number:    Date Approved/Denied:   PASRR Number: [id number] (Eff. 02/24/14)  Discharge Plan: SNF    Current Diagnoses: Patient Active Problem List   Diagnosis Date Noted  . Hypertension 06/08/2017  . NSTEMI (non-ST elevated myocardial infarction) ([place]) 06/08/2017    Orientation RESPIRATION BLADDER Height & Weight     Self, Time, Situation, Place  Normal Continent Weight: 176 lb 12.9 oz (80.2 kg) Height:  5\' 5"  (165.1 cm) (Pt reported)  BEHAVIORAL SYMPTOMS/MOOD NEUROLOGICAL BOWEL NUTRITION STATUS      Continent Diet (Heart healthy)  AMBULATORY STATUS COMMUNICATION OF NEEDS Skin   Limited Assist (Min Guard per PT) Verbally Normal                       Personal Care Assistance Level of Assistance  Bathing, Feeding, Dressing Bathing Assistance: Limited assistance Feeding assistance: Independent Dressing Assistance: Limited assistance     Functional Limitations Info  Sight, Hearing, Speech Sight Info: Adequate Hearing Info: Impaired Speech Info: Adequate    SPECIAL CARE FACTORS FREQUENCY  PT (By licensed PT)     PT Frequency: Evaluated 9/5 and a minimum of 3X per week therapy recommended              Contractures Contractures Info: Not present    Additional Factors Info  Code Status, Allergies Code Status Info: Full Allergies Info: Ciprofloxacin,  Penicillins           Current Medications (06/12/2017):  This is the current hospital active medication list Current Facility-Administered Medications  Medication Dose Route Frequency Provider Last Rate Last Dose  . 0.9 %  sodium chloride infusion  250 mL Intravenous PRN Burnell Blanks, MD      . acetaminophen (TYLENOL) tablet 650 mg  650 mg Oral Q4H PRN Marcie Mowers, MD   650 mg at 06/10/17 2100  . active partnership for health of your heart book   Does not apply Once Burnell Blanks, MD      . alum & mag hydroxide-simeth (MAALOX/MYLANTA) 200-200-20 MG/5ML suspension 30 mL  30 mL Oral Q4H PRN Marcie Mowers, MD   30 mL at 06/10/17 2329  . aspirin EC tablet 81 mg  81 mg Oral Daily Marcie Mowers, MD   81 mg at 06/12/17 0919  . atorvastatin (LIPITOR) tablet 80 mg  80 mg Oral q1800 Marcie Mowers, MD   80 mg at 06/11/17 1853  . citalopram (CELEXA) tablet 10 mg  10 mg Oral QHS Carnicelli, Earnstine Regal, MD   10 mg at 06/11/17 2121  . clopidogrel (PLAVIX) tablet 75 mg  75 mg Oral Q breakfast Burnell Blanks, MD   75 mg at 06/12/17 6767  . famotidine (PEPCID) tablet 10 mg  10 mg Oral Daily Marcie Mowers, MD   10 mg at 06/12/17 0917  . furosemide (LASIX) injection 40 mg  40 mg Intravenous Once Marcie Mowers, MD      . hydrALAZINE (APRESOLINE) tablet 25 mg  25 mg Oral Q8H Carnicelli, Earnstine Regal, MD   25 mg at 06/12/17 [id number]  . levothyroxine (SYNTHROID, LEVOTHROID) tablet 100 mcg  100 mcg Oral QAC breakfast Marcie Mowers, MD   100 mcg at 06/11/17 0956  . losartan (COZAAR) tablet 100 mg  100 mg Oral Daily Marcie Mowers, MD   100 mg at 06/12/17 0919  . magnesium hydroxide (MILK OF MAGNESIA) suspension 30 mL  30 mL Oral Daily PRN Burnell Blanks, MD   30 mL at 06/12/17 0881  . nitroGLYCERIN (NITROSTAT) SL tablet 0.4 mg  0.4 mg Sublingual Q5 Min x 3 PRN Marcie Mowers, MD   0.4 mg at 06/08/17 2004  . ondansetron (ZOFRAN) injection 4 mg  4 mg Intravenous Q6H PRN Marcie Mowers, MD   4 mg at 06/10/17 1939  . pregabalin (LYRICA) capsule 50 mg  50 mg Oral TID Marcie Mowers, MD   50 mg at 06/12/17 0916  . sodium chloride flush (NS) 0.9 % injection 3 mL  3 mL Intravenous Q12H Burnell Blanks, MD   3 mL at 06/11/17 1853  . sodium chloride flush (NS) 0.9 % injection 3 mL  3 mL Intravenous PRN Burnell Blanks, MD         Discharge Medications: Please see discharge summary for a list of discharge medications.  Relevant Imaging Results:  Relevant Lab Results:   Additional Information ss#[id number].  Sable Feil, LCSW

## 2017-06-12 NOTE — Clinical Social Work Placement (Signed)
   CLINICAL SOCIAL WORK PLACEMENT  NOTE  Date:  06/12/2017  Patient Details  Name: Tonya Cruz MRN: [id number] Date of Birth: Jun 02, 1924  Clinical Social Work is seeking post-discharge placement for this patient at the [place] level of care (*CSW will initial, date and re-position this form in  chart as items are completed):  No (CSW provided with facility preference)   Patient/family provided with [place] Work Department's list of facilities offering this level of care within the geographic area requested by the patient (or if unable, by the patient's family).  Yes   Patient/family informed of their freedom to choose among providers that offer the needed level of care, that participate in Medicare, Medicaid or managed care program needed by the patient, have an available bed and are willing to accept the patient.  No   Patient/family informed of [facility]'s ownership interest in [facility] and [facility], as well as of the fact that they are under no obligation to receive care at these facilities.  PASRR submitted to EDS on       PASRR number received on       Existing PASRR number confirmed on 06/12/17     FL2 transmitted to all facilities in geographic area requested by pt/family on 06/12/17     FL2 transmitted to all facilities within larger geographic area on       Patient informed that his/her managed care company has contracts with or will negotiate with certain facilities, including the following:        Yes   Patient/family informed of bed offers received.  Patient chooses bed at [place], [facility]     Physician recommends and patient chooses bed at      Patient to be transferred to [place] on 06/12/17.  Patient to be transferred to facility by       Patient family notified on 06/12/17 of transfer.  Name of family member notified:  [name], grandaughter [phone])     PHYSICIAN        Additional Comment:    _______________________________________________ Sable Feil, LCSW 06/12/2017, 1:24 PM

## 2017-06-12 NOTE — Discharge Summary (Signed)
The patient has been seen in conjunction with Reino Bellis, NP-C. All aspects of care have been considered and discussed. The patient has been personally interviewed, examined, and all clinical data has been reviewed.   Chronic severe coronary disease with occlusion of the RCA and high-grade obstruction in the proximal to mid LAD. LAD was treated with stenting and resolution of symptoms.  Eligible for discharge today. Will need dual antiplatelet therapy with aspirin and Brilinta for 12 months.  Agree with skilled nursing for rehabilitation.  Follow-up arrangements will be made.   Discharge Summary    Patient ID: BABYGIRL TRAGER,  MRN: [id number], DOB/AGE: Mar 16, 1924 83 y.o.  Admit date: 06/08/2017 Discharge date: 06/12/2017  Primary Care Provider: Patient, No Pcp Per Primary Cardiologist: Tamala Julian  Discharge Diagnoses    Principal Problem:   NSTEMI (non-ST elevated myocardial infarction) [facility]) Active Problems:   Hypertension   Hyperlipemia   CAD (coronary artery disease), native coronary artery   Allergies Allergies  Allergen Reactions  . Ciprofloxacin Hcl   . Penicillins     Has patient had a PCN reaction causing immediate rash, facial/tongue/throat swelling, SOB or lightheadedness with hypotension: No Has patient had a PCN reaction causing severe rash involving mucus membranes or skin necrosis: No Has patient had a PCN reaction that required hospitalization: No Has patient had a PCN reaction occurring within the last 10 years: No If all of the above answers are "NO", then may proceed with Cephalosporin use.    Diagnostic Studies/Procedures    LHC: 06/11/17  Conclusion     Mid RCA lesion, 100 %stenosed.  A STENT SYNERGY DES 3X16 drug eluting stent was successfully placed.  Prox LAD to Mid LAD lesion, 99 %stenosed.  Post intervention, there is a 0% residual stenosis.  Mid LAD lesion, 30 %stenosed.  Dist LAD lesion, 40 %stenosed.   1. NSTEMI  secondary to severe stenosis in the mid LAD 2. Chronic occlusion mid RCA with filling of distal RCA by brisk left to right collaterals.  3. Successful PTCA/DES x 1 mid LAD  Recommendations: Will continue DAPT with ASA and Plavix for at least one year. Continue beta blocker and statin.    TTE: 06/09/17  Study Conclusions  - Left ventricle: The cavity size was normal. Wall thickness was   normal. Systolic function was normal. The estimated ejection   fraction was in the range of 60% to 65%. Wall motion was normal;   there were no regional wall motion abnormalities. Doppler   parameters are consistent with pseudonormal left ventricular   relaxation (grade 2 diastolic dysfunction). The E/A ratio is   >1.5. The E/e&' ratio is >15, suggesting elevated LV filling   pressure. - Aortic valve: Trileaflet. Sclerosis without stenosis. There was   mild regurgitation. - Mitral valve: Mildly thickened leaflets . There was mild   regurgitation. - Left atrium: The atrium was mildly dilated. - Tricuspid valve: There was mild regurgitation. - Pulmonary arteries: PA peak pressure: 52 mm Hg (S). - Inferior vena cava: The vessel was normal in size. The   respirophasic diameter changes were in the normal range (>= 50%),   consistent with normal central venous pressure.  Impressions:  - LVEF 60-65%, normal wall thickness, normal wall motion, grade 2   DD with elevated LV filling pressure, aortic valve sclerosis with   mild AI, mild MR, mild LAE, mild TR, RVSP 52 mmHg, normal IVC. _____________   History of Present Illness     Tonya  KEARRA Cruz is a 83 y.o. female w/ HTN p/w NSTEMI.  Patient reported approximately 1 month of intermittent chest pain/pressure starting in the arms and radiating across the anterior chest. She is minimally active, however did live by herself and is independent of ADL's/IADL's. Her chest discomfort was not clearly related to exertion and frequently comes on while she is  at rest. She saw her primary care doctor about this and was told that is was possibly related to anxiety. She was started on an anxiolytic medication. She additionally a gastroenterologist for possible GI etiologies for her pain. On the day of admission the patient stated that her pain became so severe that she couldn't take it anymore and thus came to the hospital. She had never had pain this badly before.  Notably the patient also has developed worsening shortness of breath and orthopnea over the past few weeks. She typically would lay nearly flat in bed at night, however she required more and more pillows over several weeks and lately has been sleeping upright in a chair.   In the ED, the patient was found to be markedly hypertensive w/ SBP's in the 200's. Her ECG revealed sinus rhythm with ST depressions most prominently in the lateral precordium. A troponin was found to be elevated at 0.98. She was given a full dose aspirin, 1 sublingual nitroglycerin, and started on a heparin drip. Her pain resolved with the nitro tablet, though she did develop a headache from it. She was started on nitroglycerin drip, and admitted for further workup.  Hospital Course     Troponin peaked at 0.97. She was placed for cardiac cath following the weekend. On 9/3 she did have severe recurrent chest discomfort with associated diffuse ST depressions, as well as ST elevation in aVR. Her medical therapy was increased including her Coreg from 6.25 mg to 12.5 mg twice a day. He was given morphine and IV nitroglycerin was titrated with relief. Also given a list of Aggrastat. She was started on high intensity statin therapy on admission. Underwent cardiac catheterization on 06/11/17 noted above with chronic occlusion mid RCA with filling of the distal RCA by left-to-right collaterals, and 99% proximal/mid LAD lesion successfully treated with PTCA/DES 1. Plan for DAPT with aspirin and Plavix for at least 1 year. Post Labs showed  creatinine 1.2, hemoglobin 10.4 both stable around baseline. LDL 52 this admission. She worked well with cardiac rehabilitation without any complaints. Both family and patient requested assistance with possible SNF placement. PT was consult and agreed. Case management and social work assisted with placement.    AMARRI MICHAELSON was seen by Dr. Tamala Julian and determined stable for discharge home. Follow up in the office has been arranged. Medications are listed below.   _____________  Discharge Vitals Blood pressure (!) 119/45, pulse 67, temperature 97.7 F (36.5 C), temperature source Oral, resp. rate 14, height 5\' 5"  (1.651 m), weight 176 lb 12.9 oz (80.2 kg), SpO2 95 %.  Filed Weights   06/10/17 0358 06/11/17 0300 06/12/17 0421  Weight: 167 lb 12.8 oz (76.1 kg) 169 lb 5 oz (76.8 kg) 176 lb 12.9 oz (80.2 kg)    Labs & Radiologic Studies    CBC  Recent Labs  06/11/17 0253 06/12/17 0158  WBC 6.6 6.0  HGB 9.4* 10.4*  HCT 29.9* 33.1*  MCV 90.9 90.9  PLT 145* 536*   Basic Metabolic Panel  Recent Labs  06/11/17 1234 06/12/17 0158  NA 131* 134*  K 5.1 4.6  CL  104 105  CO2 21* 22  GLUCOSE 100* 87  BUN 18 18  CREATININE 1.17* 1.23*  CALCIUM 7.5* 7.6*   Liver Function Tests No results for input(s): AST, ALT, ALKPHOS, BILITOT, PROT, ALBUMIN in the last 72 hours. No results for input(s): LIPASE, AMYLASE in the last 72 hours. Cardiac Enzymes  Recent Labs  06/11/17 1234  TROPONINI 0.45*   BNP Invalid input(s): POCBNP D-Dimer No results for input(s): DDIMER in the last 72 hours. Hemoglobin A1C No results for input(s): HGBA1C in the last 72 hours. Fasting Lipid Panel No results for input(s): CHOL, HDL, LDLCALC, TRIG, CHOLHDL, LDLDIRECT in the last 72 hours. Thyroid Function Tests No results for input(s): TSH, T4TOTAL, T3FREE, THYROIDAB in the last 72 hours.  Invalid input(s): FREET3 _____________  Dg Chest 2 View  Result Date: 06/08/2017 CLINICAL DATA:  Chest pain  and shortness of breath for 2 weeks EXAM: CHEST  2 VIEW COMPARISON:  Feb 23, 2014 FINDINGS: The heart size and mediastinal contours are within normal limits. There is no focal infiltrate, pulmonary edema, or pleural effusion. The visualized skeletal structures are stable. IMPRESSION: No active cardiopulmonary disease. Electronically Signed   By: Abelardo Diesel M.D.   On: 06/08/2017 17:50   Disposition   Pt is being discharged home today in good condition.  Follow-up Plans & Appointments    Follow-up Information    Imogene Burn, PA-C Follow up on 06/24/2017.   Specialty:  Cardiology Why:  at 11:15am for your follow up appt.  Contact information: [address] [place] [address] [phone]          Discharge Instructions    Amb Referral to Cardiac Rehabilitation    Complete by:  As directed    Referring to [place] Phase 2   Diagnosis:   Coronary Stents NSTEMI     Call MD for:  redness, tenderness, or signs of infection (pain, swelling, redness, odor or green/yellow discharge around incision site)    Complete by:  As directed    Diet - low sodium heart healthy    Complete by:  As directed    Discharge instructions    Complete by:  As directed    Radial Site Care Refer to this sheet in the next few weeks. These instructions provide you with information on caring for yourself after your procedure. Your caregiver may also give you more specific instructions. Your treatment has been planned according to current medical practices, but problems sometimes occur. Call your caregiver if you have any problems or questions after your procedure. HOME CARE INSTRUCTIONS You may shower the day after the procedure.Remove the bandage (dressing) and gently wash the site with plain soap and water.Gently pat the site dry.  Do not apply powder or lotion to the site.  Do not submerge the affected site in water for 3 to 5 days.  Inspect the site at least twice daily.  Do not  flex or bend the affected arm for 24 hours.  No lifting over 5 pounds (2.3 kg) for 5 days after your procedure.  Do not drive home if you are discharged the same day of the procedure. Have someone else drive you.  You may drive 24 hours after the procedure unless otherwise instructed by your caregiver.  What to expect: Any bruising will usually fade within 1 to 2 weeks.  Blood that collects in the tissue (hematoma) may be painful to the touch. It should usually decrease in size and tenderness within  1 to 2 weeks.  SEEK IMMEDIATE MEDICAL CARE IF: You have unusual pain at the radial site.  You have redness, warmth, swelling, or pain at the radial site.  You have drainage (other than a small amount of blood on the dressing).  You have chills.  You have a fever or persistent symptoms for more than 72 hours.  You have a fever and your symptoms suddenly get worse.  Your arm becomes pale, cool, tingly, or numb.  You have heavy bleeding from the site. Hold pressure on the site.   PLEASE DO NOT MISS ANY DOSES OF YOUR BRILINTA!!!!! Also keep a log of you blood pressures and bring back to your follow up appt. Please call the office with any questions.   Patients taking blood thinners should generally stay away from medicines like ibuprofen, Advil, Motrin, naproxen, and Aleve due to risk of stomach bleeding. You may take Tylenol as directed or talk to your primary doctor about alternatives.   Increase activity slowly    Complete by:  As directed       Discharge Medications     Medication List    STOP taking these medications   cyanocobalamin 1000 MCG/ML injection Commonly known as:  (VITAMIN B-12)     TAKE these medications   aspirin EC 81 MG tablet Take 81 mg by mouth daily.   atorvastatin 80 MG tablet Commonly known as:  LIPITOR Take 1 tablet (80 mg total) by mouth daily at 6 PM.   carvedilol 12.5 MG tablet Commonly known as:  COREG Take 1 tablet (12.5 mg total) by mouth 2 (two)  times daily with a meal. What changed:  medication strength  how much to take   citalopram 10 MG tablet Commonly known as:  CELEXA Take 10 mg by mouth at bedtime.   clopidogrel 75 MG tablet Commonly known as:  PLAVIX Take 1 tablet (75 mg total) by mouth daily with breakfast.   denosumab 60 MG/ML Soln injection Commonly known as:  PROLIA Inject 60 mg into the skin every 6 (six) months. Administer in upper arm, thigh, or abdomen   hydrALAZINE 25 MG tablet Commonly known as:  APRESOLINE Take 1 tablet (25 mg total) by mouth 3 (three) times daily. What changed:  when to take this   levothyroxine 100 MCG tablet Commonly known as:  SYNTHROID, LEVOTHROID Take 100 mcg by mouth daily.   LORazepam 1 MG tablet Commonly known as:  ATIVAN Take 1 mg by mouth at bedtime.   losartan 100 MG tablet Commonly known as:  COZAAR Take 100 mg by mouth daily.   nitroGLYCERIN 0.4 MG SL tablet Commonly known as:  NITROSTAT Place 1 tablet (0.4 mg total) under the tongue every 5 (five) minutes x 3 doses as needed for chest pain.   pregabalin 50 MG capsule Commonly known as:  LYRICA Take 50 mg by mouth 3 (three) times daily.   ranitidine 150 MG tablet Commonly known as:  ZANTAC Take 150 mg by mouth 2 (two) times daily.        Aspirin prescribed at discharge?  Yes High Intensity Statin Prescribed? (Lipitor 40-80mg  or Crestor 20-40mg ): Yes Beta Blocker Prescribed? Yes For EF <40%, was ACEI/ARB Prescribed? Yes ADP Receptor Inhibitor Prescribed? (i.e. Plavix etc.-Includes Medically Managed Patients): Yes For EF <40%, Aldosterone Inhibitor Prescribed? No: EF ok Was EF assessed during THIS hospitalization? Yes Was Cardiac Rehab II ordered? (Included Medically managed Patients): Yes   Outstanding Labs/Studies   FLP/LFTs in 6 weeks  Duration of Discharge Encounter   Greater than 30 minutes including physician time.  Signed, Reino Bellis NP-C 06/12/2017, 2:14 PM

## 2017-06-12 NOTE — Progress Notes (Signed)
CARDIAC REHAB PHASE I   PRE:  Rate/Rhythm: 75 SR  BP:  Supine: 99/78  Sitting:   Standing:    SaO2: 97%RA  MODE:  Ambulation: 112 ft   POST:  Rate/Rhythm: 82 SR  BP:  Supine:   Sitting: 125/65  Standing:    SaO2: 100%RA 0805-0900 Pt walked 112 ft on RA with gait belt use and rolling walker. Stopped at least ten times to rest and catch her breath. Denied CP. To recliner with chair alarm after walk. Discussed the importance of plavix with stent. Reviewed NTG use, MI restrictions, watching sodium and walking as tolerated with assistance. Will refer to [place] CRP 2. Pt does not drive and will probably not be able to attend. Needs PT consult to address home needs since pt lives alone and is deconditioned.   Graylon Good, RN BSN  06/12/2017 8:56 AM

## 2017-06-12 NOTE — Progress Notes (Signed)
The patient has been seen in conjunction with Reino Bellis, NP-C. All aspects of care have been considered and discussed. The patient has been personally interviewed, examined, and all clinical data has been reviewed.   Chronic severe coronary disease with occlusion of the RCA and high-grade obstruction in the proximal to mid LAD. LAD was treated with stenting and resolution of symptoms.  Eligible for discharge today. Will need dual antiplatelet therapy with aspirin and Brilinta for 12 months.  Agree with skilled nursing for rehabilitation.  Follow-up arrangements will be made.   Progress Note  Patient Name: Tonya Cruz Date of Encounter: 06/12/2017  Primary Cardiologist: Tamala Julian  Subjective   Feeling well this morning. No complaints.   Inpatient Medications    Scheduled Meds: . active partnership for health of your heart book   Does not apply Once  . aspirin EC  81 mg Oral Daily  . atorvastatin  80 mg Oral q1800  . citalopram  10 mg Oral QHS  . clopidogrel  75 mg Oral Q breakfast  . famotidine  10 mg Oral Daily  . furosemide  40 mg Intravenous Once  . hydrALAZINE  25 mg Oral Q8H  . levothyroxine  100 mcg Oral QAC breakfast  . losartan  100 mg Oral Daily  . pregabalin  50 mg Oral TID  . sodium chloride flush  3 mL Intravenous Q12H   Continuous Infusions: . sodium chloride     PRN Meds: sodium chloride, acetaminophen, alum & mag hydroxide-simeth, magnesium hydroxide, nitroGLYCERIN, ondansetron (ZOFRAN) IV, sodium chloride flush   Vital Signs    Vitals:   06/12/17 0421 06/12/17 0723 06/12/17 0830 06/12/17 1100  BP: (!) 158/46 (!) 135/47 125/65 (!) 119/45  Pulse: 68 72  67  Resp: 16 (!) 21 (!) 28 14  Temp: 98.3 F (36.8 C) 97.9 F (36.6 C)  97.7 F (36.5 C)  TempSrc: Oral Oral  Oral  SpO2: 95% 98%  95%  Weight: 176 lb 12.9 oz (80.2 kg)     Height:        Intake/Output Summary (Last 24 hours) at 06/12/17 1149 Last data filed at 06/12/17 0900  Gross per 24 hour  Intake              875 ml  Output             1500 ml  Net             -625 ml   Filed Weights   06/10/17 0358 06/11/17 0300 06/12/17 0421  Weight: 167 lb 12.8 oz (76.1 kg) 169 lb 5 oz (76.8 kg) 176 lb 12.9 oz (80.2 kg)    Telemetry    SR - Personally Reviewed  ECG    SR - Personally Reviewed  Physical Exam   General: Well developed, well nourished, older W female appearing in no acute distress. Head: Normocephalic, atraumatic.  Neck: Supple without bruits, JVD. Lungs:  Resp regular and unlabored, CTA. Heart: RRR, S1, S2, no S3, S4, soft systolic murmur; no rub. Abdomen: Soft, non-tender, non-distended with normoactive bowel sounds. No hepatomegaly. No rebound/guarding. No obvious abdominal masses. Extremities: No clubbing, cyanosis, edema. Distal pedal pulses are 2+ bilaterally. Right radial cath site stable.  Neuro: Alert and oriented X 3. Moves all extremities spontaneously. Psych: Normal affect.  Labs    Chemistry Recent Labs Lab 06/09/17 0400 06/11/17 1234 06/12/17 0158  NA 132* 131* 134*  K 4.3 5.1 4.6  CL 100* 104 105  CO2 27 21* 22  GLUCOSE 84 100* 87  BUN 16 18 18   CREATININE 1.23* 1.17* 1.23*  CALCIUM 8.5* 7.5* 7.6*  GFRNONAA 37* 39* 37*  GFRAA 43* 46* 43*  ANIONGAP 5 6 7      Hematology Recent Labs Lab 06/10/17 0209 06/11/17 0253 06/12/17 0158  WBC 8.4 6.6 6.0  RBC 3.69* 3.29* 3.64*  HGB 10.8* 9.4* 10.4*  HCT 33.5* 29.9* 33.1*  MCV 90.8 90.9 90.9  MCH 29.3 28.6 28.6  MCHC 32.2 31.4 31.4  RDW 14.6 14.6 14.7  PLT 161 145* 130*    Cardiac Enzymes Recent Labs Lab 06/09/17 0855 06/09/17 1321 06/11/17 1234  TROPONINI 0.79* 0.67* 0.45*    Recent Labs Lab 06/08/17 1736  TROPIPOC 0.98*     BNP Recent Labs Lab 06/08/17 1722 06/11/17 1234  BNP 262.7* 258.8*     DDimer  Recent Labs Lab 06/08/17 1722  DDIMER 0.58*      Radiology    No results found.  Cardiac Studies   LHC: 06/11/17  Conclusion      Mid RCA lesion, 100 %stenosed.  A STENT SYNERGY DES 3X16 drug eluting stent was successfully placed.  Prox LAD to Mid LAD lesion, 99 %stenosed.  Post intervention, there is a 0% residual stenosis.  Mid LAD lesion, 30 %stenosed.  Dist LAD lesion, 40 %stenosed.   1. NSTEMI secondary to severe stenosis in the mid LAD 2. Chronic occlusion mid RCA with filling of distal RCA by brisk left to right collaterals.  3. Successful PTCA/DES x 1 mid LAD  Recommendations: Will continue DAPT with ASA and Plavix for at least one year. Continue beta blocker and statin.    TTE: 06/09/17  Study Conclusions  - Left ventricle: The cavity size was normal. Wall thickness was   normal. Systolic function was normal. The estimated ejection   fraction was in the range of 60% to 65%. Wall motion was normal;   there were no regional wall motion abnormalities. Doppler   parameters are consistent with pseudonormal left ventricular   relaxation (grade 2 diastolic dysfunction). The E/A ratio is   >1.5. The E/e&' ratio is >15, suggesting elevated LV filling   pressure. - Aortic valve: Trileaflet. Sclerosis without stenosis. There was   mild regurgitation. - Mitral valve: Mildly thickened leaflets . There was mild   regurgitation. - Left atrium: The atrium was mildly dilated. - Tricuspid valve: There was mild regurgitation. - Pulmonary arteries: PA peak pressure: 52 mm Hg (S). - Inferior vena cava: The vessel was normal in size. The   respirophasic diameter changes were in the normal range (>= 50%),   consistent with normal central venous pressure.  Impressions:  - LVEF 60-65%, normal wall thickness, normal wall motion, grade 2   DD with elevated LV filling pressure, aortic valve sclerosis with   mild AI, mild MR, mild LAE, mild TR, RVSP 52 mmHg, normal IVC.  Patient Profile     83 y.o. female who lives independently but has poor mobility presented with a one-month history of recurring  episodes of chest burning radiating into both arms and progressive shortness of breath. She was admitted on 06/08/2017 with recurring episodes of chest pain. IV nitroglycerin has been started with improvement. 06/10/17 she had severe recurrent chest discomfort associated with diffuse ST depression as well as AVR ST elevation. Planned for cardiac cath.   Assessment & Plan    1. NSTEMI: Underwent LHC with Dr. Angelena Form yesterday with chronic occlusion mid RCA with filling  of distal RCA by brisk left to right collaterals. Also noted lesion to p/mLAD with successful PTCA/DES x 1 mid LAD. Plan for DAPT with ASA/plavix for at least one year, along with statin, and BB. Trop peaked at 0.98.   2. HTN: Stable with current therapy  3. HL: on high dose statin  4. CKD: Mild increase in Cr post cath. 1.17>>1.23.   5. Anemia: Hgb stable post cath at 10.4  Dispo: Pt and family requested SNF placement. Seen by PT who agreed. CM and SW consulted to assist with placement.    Signed, Reino Bellis, NP  06/12/2017, 11:49 AM  Pager # [phone]

## 2017-06-12 NOTE — Care Management Note (Signed)
Case Management Note  Patient Details  Name: Tonya Cruz MRN: [id number] Date of Birth: August 28, 1924  Subjective/Objective:     From home alone, s/p coronary stent intervention, will be on plaivx and asa. Per RN, family wants patient to go to [facility] SNF, CSW aware per RN.  Per pt eval rec SNF.  CSW facilitating.                             Action/Plan:   Expected Discharge Date:  06/12/17               Expected Discharge Plan:  Skilled Nursing Facility  In-House Referral:  Clinical Social Work  Discharge planning Services  CM Consult  Post Acute Care Choice:    Choice offered to:     DME Arranged:    DME Agency:     HH Arranged:    [place] Agency:     Status of Service:  Completed, signed off  If discussed at [employer], dates discussed:    Additional Comments:  Zenon Mayo, RN 06/12/2017, 2:15 PM

## 2017-06-12 NOTE — Telephone Encounter (Signed)
**Note De-Identified Tonya Cruz Obfuscation** Per the pts daughter, Eustaquio Maize, the pt has not been d/c'd from the hospital yet and when she is she is going to [place] facility. TCM Call not needed as the pt is going to a nursing facilty.

## 2017-06-13 NOTE — Consult Note (Signed)
           [facility] CM Primary Care Navigator  06/13/2017  Tonya Cruz 01/13/1924 [id number]   Went to see patient at the bedside to identify possible discharge needs but she was alreadydischarged per staff report.  Patient went to skilled nursing facility- [facility] in [place] returning home per therapy recommendation.   Noted transition of care (TOC) call made by provider's office yesterday by Renaee Munda, LPN but patient was discharged to skilled nursing facility.  For questions, please contact:  Dannielle Huh, BSN, RN- [facility] Primary Care Navigator  Telephone: [phone] [place]

## 2017-06-14 DIAGNOSIS — I1310 Hypertensive heart and chronic kidney disease without heart failure, with stage 1 through stage 4 chronic kidney disease, or unspecified chronic kidney disease: Secondary | ICD-10-CM | POA: Diagnosis not present

## 2017-06-14 DIAGNOSIS — R262 Difficulty in walking, not elsewhere classified: Secondary | ICD-10-CM | POA: Diagnosis not present

## 2017-06-14 DIAGNOSIS — I2510 Atherosclerotic heart disease of native coronary artery without angina pectoris: Secondary | ICD-10-CM | POA: Diagnosis not present

## 2017-06-14 DIAGNOSIS — E785 Hyperlipidemia, unspecified: Secondary | ICD-10-CM | POA: Diagnosis not present

## 2017-06-24 ENCOUNTER — Ambulatory Visit: Payer: Medicare Other | Admitting: Physician Assistant

## 2017-06-27 NOTE — Progress Notes (Signed)
Cardiology Office Note   Date:  06/28/2017   ID:  Tonya Cruz, DOB 12-Sep-1924, MRN [id number]  PCP:  Annamarie Major, MD  Cardiologist:  Dr. Tamala Julian    Chief Complaint  Patient presents with  . Hospitalization Follow-up      History of Present Illness: Tonya Cruz is a 83 y.o. female who presents for post hospitalization from 06/08/17 to 06/12/17 for NSTEMI.  She has hx of HTN, HLD and CAD. Pt had episodes of chest pain for 1 month.   The pain would come and go with exercise and rest.  The pain increased and presented to ER due to severity of pain.   Other symptoms were SOB and orthopnea and required being propped up to sleep, until prior to admit sleeping in chair.  In ER BP systolic in the 220U, she had ST depression in lateral leads on EKG.     Troponin pk at 0.97, on 9/3 she had pain with EKG changes and IV morphine and IV NTG given, with increase of BB.  She under went cardiac cath with chronic occlusion mid RCA with filling of the distal RCA by left-to-right collaterals, and 99% proximal/mid LAD lesion successfully treated with PTCA/DES 1. Plan for DAPT with aspirin and Plavix for at least 1 year. Pt is for TOC. She was discharged to SNF -previously lived alone.  EF was normal, with G2DD.  RVSP 52 mmHg.    Cr. At discharge was 1.23  hgb at 10.4 discharged on Plavix and ASA.  Today BP has been 542 systolic.  Her hydralazine has been increased to TID.  Her HR is lower.  She still has episodes of the burning in chest and across to arms daily.  No SOB. No Nausea.  She complains of fatigue, very tired.  She is doing well with PT and plans to go home next week.  Her family has concerns about lipitor 80 and wonders if Brilinta would be better than plavix.    We discussed increase chance of bleed with Brilinta and she is bradycardic today and for some Brilinta has increased Bradycardia.    She did have an episode of Blood with BM she felt was hemorrhoids.  She was anemic in the  hospital.  She aslo had renal insuff in the hospital.  Her appetite is good, and she is decreasing salt though she is in SNF so this is somewhat difficult.     Past Medical History:  Diagnosis Date  . Anxiety   . Breast cancer ([place])    right side  . Coronary artery disease    06/11/17 PTCA/DES x1 to mLAD, CTO mRCA with col.   [name] GERD (gastroesophageal reflux disease)   . High cholesterol   . Hypertension   . NSTEMI (non-ST elevated myocardial infarction) ([place]) 06/11/2017  . Thyroid disease     Past Surgical History:  Procedure Laterality Date  . BREAST SURGERY    . CARDIAC CATHETERIZATION  06/11/2017  . CORONARY STENT INTERVENTION N/A 06/11/2017   Procedure: CORONARY STENT INTERVENTION;  Surgeon: Burnell Blanks, MD;  Location: [facility];  Service: Cardiovascular;  Laterality: N/A;  . LEFT HEART CATH AND CORONARY ANGIOGRAPHY N/A 06/11/2017   Procedure: LEFT HEART CATH AND CORONARY ANGIOGRAPHY;  Surgeon: Burnell Blanks, MD;  Location: [facility];  Service: Cardiovascular;  Laterality: N/A;  . roto cuff       Current Outpatient Prescriptions  Medication Sig Dispense Refill  . acetaminophen (TYLENOL)  325 MG tablet Take 650 mg by mouth every 4 (four) hours as needed for mild pain or moderate pain.    [name] aspirin EC 81 MG tablet Take 81 mg by mouth daily.    . calcium-vitamin D (OSCAL WITH D) 250-125 MG-UNIT tablet Take 1 tablet by mouth daily.    . citalopram (CELEXA) 10 MG tablet Take 10 mg by mouth at bedtime.  0  . clopidogrel (PLAVIX) 75 MG tablet Take 1 tablet (75 mg total) by mouth daily with breakfast. 90 tablet 3  . denosumab (PROLIA) 60 MG/ML SOLN injection Inject 60 mg into the skin every 6 (six) months. Administer in upper arm, thigh, or abdomen    . hydrALAZINE (APRESOLINE) 100 MG tablet Take 100 mg by mouth 3 (three) times daily.    . Lactobacillus Rhamnosus, GG, (CULTURELLE PO) Take 1 tablet by mouth daily.    [name] levothyroxine (SYNTHROID,  LEVOTHROID) 100 MCG tablet Take 100 mcg by mouth daily.  0  . LORazepam (ATIVAN) 1 MG tablet Take 1 mg by mouth at bedtime.  0  . losartan (COZAAR) 100 MG tablet Take 100 mg by mouth daily.  0  . Melatonin 3 MG TABS Take 6 mg by mouth at bedtime.    . nitroGLYCERIN (NITROSTAT) 0.4 MG SL tablet Place 1 tablet (0.4 mg total) under the tongue every 5 (five) minutes x 3 doses as needed for chest pain. 25 tablet 2  . pregabalin (LYRICA) 50 MG capsule Take 50 mg by mouth 3 (three) times daily.    . ranitidine (ZANTAC) 150 MG tablet Take 150 mg by mouth 2 (two) times daily.  0  . senna (SENOKOT) 8.6 MG TABS tablet Take 2 tablets by mouth daily.    [name] amLODipine (NORVASC) 2.5 MG tablet Take 1 tablet (2.5 mg total) by mouth daily. 30 tablet 3  . atorvastatin (LIPITOR) 40 MG tablet Take 1 tablet (40 mg total) by mouth daily. 30 tablet 6  . carvedilol (COREG) 6.25 MG tablet Take 1 tablet (6.25 mg total) by mouth 2 (two) times daily. 60 tablet 6  . isosorbide mononitrate (IMDUR) 30 MG 24 hr tablet Take 1 tablet (30 mg total) by mouth daily. 30 tablet 3   No current facility-administered medications for this visit.     Allergies:   Ciprofloxacin hcl and Penicillins    Social History:  The patient  reports that she has quit smoking. She has never used smokeless tobacco. She reports that she does not drink alcohol or use drugs.   Family History:  The patient's family history includes Diabetes in her mother; Other in her father.    ROS:  General:no colds or fevers, + weight decrease in SNF + fatigue Skin:no rashes or ulcers (did have a rash at NH resolved quickly) HEENT:no blurred vision, no congestion CV:see HPI PUL:see HPI GI:no diarrhea constipation one episode of blood in stool with BM otherwise no melena, no indigestion GU:no hematuria, no dysuria MS:no joint pain, no claudication Neuro:no syncope, no lightheadedness Endo:no diabetes, no thyroid disease  Wt Readings from Last 3 Encounters:    06/28/17 168 lb (76.2 kg)  06/12/17 176 lb 12.9 oz (80.2 kg)     PHYSICAL EXAM: VS:  BP (!) 140/52 (BP Location: Left Arm, Patient Position: Sitting, Cuff Size: Normal)   Pulse (!) 59   Ht 5\' 5"  (1.651 m)   Wt 168 lb (76.2 kg)   SpO2 99% Comment: AT REST  BMI 27.96 kg/m  , BMI Body  mass index is 27.96 kg/m. General:Pleasant affect, NAD Skin:Warm and dry, brisk capillary refill HEENT:normocephalic, sclera clear, mucus membranes moist Neck:supple, no JVD, no bruits  Heart:S1S2 RRR without murmur, gallup, rub or click Lungs:clear without rales, rhonchi, or wheezes VZC:HYIF, non tender, + BS, do not palpate liver spleen or masses Ext:no lower ext edema, 2+ pedal pulses, 2+ radial pulses Neuro:alert and oriented X 3, MAE, follows commands, + facial symmetry    EKG:  EKG is ordered today. The ekg ordered today demonstrates SB at 16 and improved ST depression in lateral leads.      Recent Labs: 06/11/2017: B Natriuretic Peptide 258.8 06/12/2017: BUN 18; Creatinine, Ser 1.23; Hemoglobin 10.4; Platelets 130; Potassium 4.6; Sodium 134    Lipid Panel    Component Value Date/Time   CHOL 112 06/09/2017 0400   TRIG 99 06/09/2017 0400   HDL 40 (L) 06/09/2017 0400   CHOLHDL 2.8 06/09/2017 0400   VLDL 20 06/09/2017 0400   LDLCALC 52 06/09/2017 0400       Other studies Reviewed: Additional studies/ records that were reviewed today include: .  Echo 06/09/17 Study Conclusions  - Left ventricle: The cavity size was normal. Wall thickness was   normal. Systolic function was normal. The estimated ejection   fraction was in the range of 60% to 65%. Wall motion was normal;   there were no regional wall motion abnormalities. Doppler   parameters are consistent with pseudonormal left ventricular   relaxation (grade 2 diastolic dysfunction). The E/A ratio is   >1.5. The E/e&' ratio is >15, suggesting elevated LV filling   pressure. - Aortic valve: Trileaflet. Sclerosis without stenosis.  There was   mild regurgitation. - Mitral valve: Mildly thickened leaflets . There was mild   regurgitation. - Left atrium: The atrium was mildly dilated. - Tricuspid valve: There was mild regurgitation. - Pulmonary arteries: PA peak pressure: 52 mm Hg (S). - Inferior vena cava: The vessel was normal in size. The   respirophasic diameter changes were in the normal range (>= 50%),   consistent with normal central venous pressure.  Impressions:  - LVEF 60-65%, normal wall thickness, normal wall motion, grade 2   DD with elevated LV filling pressure, aortic valve sclerosis with   mild AI, mild MR, mild LAE, mild TR, RVSP 52 mmHg, normal IVC.   Cardiac cath 06/11/17 Procedures   CORONARY STENT INTERVENTION  LEFT HEART CATH AND CORONARY ANGIOGRAPHY  Conclusion     Mid RCA lesion, 100 %stenosed.  A STENT SYNERGY DES 3X16 drug eluting stent was successfully placed.  Prox LAD to Mid LAD lesion, 99 %stenosed.  Post intervention, there is a 0% residual stenosis.  Mid LAD lesion, 30 %stenosed.  Dist LAD lesion, 40 %stenosed.   1. NSTEMI secondary to severe stenosis in the mid LAD 2. Chronic occlusion mid RCA with filling of distal RCA by brisk left to right collaterals.  3. Successful PTCA/DES x 1 mid LAD  Recommendations: Will continue DAPT with ASA and Plavix for at least one year. Continue beta blocker and statin.      ASSESSMENT AND PLAN:  1.  NSTEMI with PCI with DES Synergy to pLAD to mLAD  On plavix and asa. Continue.  Discussed changing to  brilinta from Plavix but for now with slow HR and blood in stool will continue with plavix.  Check CBC.   2.  Angina, similar symptoms as prior to MI, though EKG is improved.  Will add Imdur to prevent pain  and improved BP control may prevent.  Will see her back in 2-3 weeks to re-eval though if symptoms increase will re-eval.    3.  CAD with chronic occlusion of RCA.  On statin. Asa plavix,   4.  HTN very high at times  205/71 at other times 570 systolic but overall elevated.  Agree with hydralazine TID, will add amlodipine as well 2.5 mg, and the imdur.  5.  HLD on arrival stable off statins, discharged on 80 mg, but family concerned at high dose in 83 year old.  Will decrease to 40 mg and recheck in 4 weeks, hepatic and lipid.  6.  Fatigue, she is still tired -may be due to BB, HR is in 50s and this is a higher dose for her.  Will decrease back to 6.25 BID.     Planning to go out of town in a week, which is fine if she feels up to it.    7.  CKD-3 and hyponatremia will recheck BMP today   She also rec'd flu vaccine today.        Current medicines are reviewed with the patient today.  The patient Has no concerns regarding medicines.  The following changes have been made:  See above Labs/ tests ordered today include:see above  Disposition:   FU:  see above  Signed, Cecilie Kicks, NP  06/28/2017 9:51 AM    [facility], [place], [place], [place] Phone: [phone]; Fax: [phone]

## 2017-06-28 ENCOUNTER — Encounter: Payer: Self-pay | Admitting: Cardiology

## 2017-06-28 ENCOUNTER — Ambulatory Visit (INDEPENDENT_AMBULATORY_CARE_PROVIDER_SITE_OTHER): Payer: Medicare Other | Admitting: Cardiology

## 2017-06-28 ENCOUNTER — Telehealth: Payer: Self-pay | Admitting: Cardiology

## 2017-06-28 VITALS — BP 140/52 | HR 59 | Ht 65.0 in | Wt 168.0 lb

## 2017-06-28 DIAGNOSIS — I214 Non-ST elevation (NSTEMI) myocardial infarction: Secondary | ICD-10-CM | POA: Diagnosis not present

## 2017-06-28 DIAGNOSIS — I2510 Atherosclerotic heart disease of native coronary artery without angina pectoris: Secondary | ICD-10-CM

## 2017-06-28 DIAGNOSIS — E871 Hypo-osmolality and hyponatremia: Secondary | ICD-10-CM | POA: Diagnosis not present

## 2017-06-28 DIAGNOSIS — N183 Chronic kidney disease, stage 3 unspecified: Secondary | ICD-10-CM

## 2017-06-28 DIAGNOSIS — E7849 Other hyperlipidemia: Secondary | ICD-10-CM

## 2017-06-28 DIAGNOSIS — E784 Other hyperlipidemia: Secondary | ICD-10-CM | POA: Diagnosis not present

## 2017-06-28 DIAGNOSIS — R5383 Other fatigue: Secondary | ICD-10-CM

## 2017-06-28 DIAGNOSIS — E782 Mixed hyperlipidemia: Secondary | ICD-10-CM

## 2017-06-28 DIAGNOSIS — Z23 Encounter for immunization: Secondary | ICD-10-CM

## 2017-06-28 DIAGNOSIS — I10 Essential (primary) hypertension: Secondary | ICD-10-CM | POA: Diagnosis not present

## 2017-06-28 LAB — CBC WITH DIFFERENTIAL/PLATELET
BASOS: 0 %
Basophils Absolute: 0 10*3/uL (ref 0.0–0.2)
EOS (ABSOLUTE): 0.2 10*3/uL (ref 0.0–0.4)
Eos: 3 %
HEMATOCRIT: 32.9 % — AB (ref 34.0–46.6)
HEMOGLOBIN: 11 g/dL — AB (ref 11.1–15.9)
IMMATURE GRANS (ABS): 0 10*3/uL (ref 0.0–0.1)
Immature Granulocytes: 0 %
LYMPHS: 13 %
Lymphocytes Absolute: 0.9 10*3/uL (ref 0.7–3.1)
MCH: 29.3 pg (ref 26.6–33.0)
MCHC: 33.4 g/dL (ref 31.5–35.7)
MCV: 88 fL (ref 79–97)
MONOCYTES: 10 %
Monocytes Absolute: 0.7 10*3/uL (ref 0.1–0.9)
NEUTROS ABS: 5.2 10*3/uL (ref 1.4–7.0)
Neutrophils: 74 %
Platelets: 193 10*3/uL (ref 150–379)
RBC: 3.75 x10E6/uL — ABNORMAL LOW (ref 3.77–5.28)
RDW: 14.7 % (ref 12.3–15.4)
WBC: 7 10*3/uL (ref 3.4–10.8)

## 2017-06-28 LAB — BASIC METABOLIC PANEL
BUN/Creatinine Ratio: 15 (ref 12–28)
BUN: 16 mg/dL (ref 10–36)
CALCIUM: 9 mg/dL (ref 8.7–10.3)
CO2: 23 mmol/L (ref 20–29)
Chloride: 93 mmol/L — ABNORMAL LOW (ref 96–106)
Creatinine, Ser: 1.08 mg/dL — ABNORMAL HIGH (ref 0.57–1.00)
GFR, EST AFRICAN AMERICAN: 52 mL/min/{1.73_m2} — AB (ref >59)
GFR, EST NON AFRICAN AMERICAN: 45 mL/min/{1.73_m2} — AB (ref >59)
Glucose: 71 mg/dL (ref 65–99)
POTASSIUM: 5.2 mmol/L (ref 3.5–5.2)
Sodium: 130 mmol/L — ABNORMAL LOW (ref 134–144)

## 2017-06-28 MED ORDER — ATORVASTATIN CALCIUM 40 MG PO TABS: 40.0000 mg | ORAL_TABLET | Freq: Every day | ORAL | 6 refills | Status: DC

## 2017-06-28 MED ORDER — AMLODIPINE BESYLATE 2.5 MG PO TABS: 2.5000 mg | ORAL_TABLET | Freq: Every day | ORAL | 3 refills | Status: DC

## 2017-06-28 MED ORDER — ISOSORBIDE MONONITRATE ER 30 MG PO TB24: 30.0000 mg | ORAL_TABLET | Freq: Every day | ORAL | 3 refills | Status: DC

## 2017-06-28 MED ORDER — CARVEDILOL 6.25 MG PO TABS: 6.2500 mg | ORAL_TABLET | Freq: Two times a day (BID) | ORAL | 6 refills | Status: DC

## 2017-06-28 NOTE — Telephone Encounter (Signed)
I called and spoke with Dorothea Ogle at the patient's nursing facility. He just had questions about the patient's isosorbide- it was not specified on the patient's paperwork for the facility if this was mononitrate. I advised Dorothea Ogle, that the patient should be on isosorbide mononitrate 30 mg once daily. He voices understanding.

## 2017-06-28 NOTE — Telephone Encounter (Signed)
New Message    Nursing home needs clarification on the medication you put the patient on today , please call today so that they can get patient started on the proper medication.   Isosorbide mononitrate Carvedilol  atorvastatin

## 2017-06-28 NOTE — Patient Instructions (Addendum)
Medication Instructions:  Your physician has recommended you make the following change in your medication:  Decrease atorvastatin to 40 mg by mouth daily. Decrease Coreg to 6.25 mg by mouth twice daily. Start Isosorbide mononitrate 30 mg by mouth daily. Start amlodipine 2.5 mg by mouth daily.    Labwork: Lab work to be done today--BMP and CBC  Your physician recommends that you return for lab work in: 6 weeks--Lipid and Liver profiles.  This will be fasting.  Have done at primary care the week of November 5th.  You have a prescription for this.    Testing/Procedures: none  Follow-Up: Your physician recommends that you schedule a follow-up appointment in: 3 weeks.  Scheduled for July 22, 2017 at 11:00 with Truitt Merle, NP     Any Other Special Instructions Will Be Listed Below (If Applicable).     If you need a refill on your cardiac medications before your next appointment, please call your pharmacy.

## 2017-07-06 ENCOUNTER — Inpatient Hospital Stay (HOSPITAL_COMMUNITY)
Admission: EM | Admit: 2017-07-06 | Discharge: 2017-07-07 | DRG: 641 | Disposition: A | Discharge status: Home / Self Care | Payer: Medicare Other | Attending: Family Medicine | Admitting: Family Medicine

## 2017-07-06 ENCOUNTER — Encounter (HOSPITAL_COMMUNITY): Payer: Self-pay | Admitting: Emergency Medicine

## 2017-07-06 ENCOUNTER — Emergency Department (HOSPITAL_COMMUNITY): Payer: Medicare Other

## 2017-07-06 DIAGNOSIS — K219 Gastro-esophageal reflux disease without esophagitis: Secondary | ICD-10-CM | POA: Diagnosis present

## 2017-07-06 DIAGNOSIS — E871 Hypo-osmolality and hyponatremia: Principal | ICD-10-CM | POA: Diagnosis present

## 2017-07-06 DIAGNOSIS — R0789 Other chest pain: Secondary | ICD-10-CM | POA: Diagnosis not present

## 2017-07-06 DIAGNOSIS — Z79899 Other long term (current) drug therapy: Secondary | ICD-10-CM | POA: Diagnosis not present

## 2017-07-06 DIAGNOSIS — Z87891 Personal history of nicotine dependence: Secondary | ICD-10-CM | POA: Diagnosis not present

## 2017-07-06 DIAGNOSIS — R001 Bradycardia, unspecified: Secondary | ICD-10-CM | POA: Diagnosis present

## 2017-07-06 DIAGNOSIS — E7800 Pure hypercholesterolemia, unspecified: Secondary | ICD-10-CM | POA: Diagnosis not present

## 2017-07-06 DIAGNOSIS — I252 Old myocardial infarction: Secondary | ICD-10-CM | POA: Diagnosis not present

## 2017-07-06 DIAGNOSIS — F419 Anxiety disorder, unspecified: Secondary | ICD-10-CM | POA: Diagnosis present

## 2017-07-06 DIAGNOSIS — E039 Hypothyroidism, unspecified: Secondary | ICD-10-CM | POA: Diagnosis present

## 2017-07-06 DIAGNOSIS — I509 Heart failure, unspecified: Secondary | ICD-10-CM | POA: Diagnosis not present

## 2017-07-06 DIAGNOSIS — Z853 Personal history of malignant neoplasm of breast: Secondary | ICD-10-CM

## 2017-07-06 DIAGNOSIS — Z7982 Long term (current) use of aspirin: Secondary | ICD-10-CM | POA: Diagnosis not present

## 2017-07-06 DIAGNOSIS — Z881 Allergy status to other antibiotic agents status: Secondary | ICD-10-CM | POA: Diagnosis not present

## 2017-07-06 DIAGNOSIS — I25119 Atherosclerotic heart disease of native coronary artery with unspecified angina pectoris: Secondary | ICD-10-CM | POA: Diagnosis present

## 2017-07-06 DIAGNOSIS — Z880 Allergy status to penicillin: Secondary | ICD-10-CM

## 2017-07-06 DIAGNOSIS — E785 Hyperlipidemia, unspecified: Secondary | ICD-10-CM | POA: Diagnosis present

## 2017-07-06 DIAGNOSIS — I2582 Chronic total occlusion of coronary artery: Secondary | ICD-10-CM | POA: Diagnosis present

## 2017-07-06 DIAGNOSIS — I110 Hypertensive heart disease with heart failure: Secondary | ICD-10-CM | POA: Diagnosis not present

## 2017-07-06 DIAGNOSIS — F329 Major depressive disorder, single episode, unspecified: Secondary | ICD-10-CM | POA: Diagnosis present

## 2017-07-06 DIAGNOSIS — Z7902 Long term (current) use of antithrombotics/antiplatelets: Secondary | ICD-10-CM | POA: Diagnosis not present

## 2017-07-06 DIAGNOSIS — R079 Chest pain, unspecified: Secondary | ICD-10-CM | POA: Diagnosis present

## 2017-07-06 DIAGNOSIS — E079 Disorder of thyroid, unspecified: Secondary | ICD-10-CM | POA: Diagnosis present

## 2017-07-06 DIAGNOSIS — I2510 Atherosclerotic heart disease of native coronary artery without angina pectoris: Secondary | ICD-10-CM | POA: Diagnosis present

## 2017-07-06 DIAGNOSIS — R05 Cough: Secondary | ICD-10-CM | POA: Diagnosis not present

## 2017-07-06 DIAGNOSIS — Z66 Do not resuscitate: Secondary | ICD-10-CM | POA: Diagnosis present

## 2017-07-06 DIAGNOSIS — Z955 Presence of coronary angioplasty implant and graft: Secondary | ICD-10-CM

## 2017-07-06 LAB — I-STAT TROPONIN, ED: TROPONIN I, POC: 0.01 ng/mL (ref 0.00–0.08)

## 2017-07-06 LAB — CBC
HCT: 35.3 % — ABNORMAL LOW (ref 36.0–46.0)
Hemoglobin: 11.5 g/dL — ABNORMAL LOW (ref 12.0–15.0)
MCH: 28.5 pg (ref 26.0–34.0)
MCHC: 32.6 g/dL (ref 30.0–36.0)
MCV: 87.6 fL (ref 78.0–100.0)
PLATELETS: 168 10*3/uL (ref 150–400)
RBC: 4.03 MIL/uL (ref 3.87–5.11)
RDW: 14 % (ref 11.5–15.5)
WBC: 9.4 10*3/uL (ref 4.0–10.5)

## 2017-07-06 LAB — T4, FREE: FREE T4: 0.9 ng/dL (ref 0.61–1.12)

## 2017-07-06 LAB — URINALYSIS, ROUTINE W REFLEX MICROSCOPIC
BILIRUBIN URINE: NEGATIVE
Glucose, UA: NEGATIVE mg/dL
Hgb urine dipstick: NEGATIVE
Ketones, ur: NEGATIVE mg/dL
LEUKOCYTES UA: NEGATIVE
Nitrite: NEGATIVE
PH: 6 (ref 5.0–8.0)
Protein, ur: NEGATIVE mg/dL
SPECIFIC GRAVITY, URINE: 1.013 (ref 1.005–1.030)

## 2017-07-06 LAB — PHOSPHORUS: Phosphorus: 2.7 mg/dL (ref 2.5–4.6)

## 2017-07-06 LAB — MAGNESIUM: Magnesium: 1.9 mg/dL (ref 1.7–2.4)

## 2017-07-06 LAB — CORTISOL: Cortisol, Plasma: 7.4 ug/dL

## 2017-07-06 LAB — BASIC METABOLIC PANEL
Anion gap: 8 (ref 5–15)
BUN: 16 mg/dL (ref 6–20)
CALCIUM: 8.8 mg/dL — AB (ref 8.9–10.3)
CHLORIDE: 93 mmol/L — AB (ref 101–111)
CO2: 23 mmol/L (ref 22–32)
Creatinine, Ser: 0.98 mg/dL (ref 0.44–1.00)
GFR calc Af Amer: 57 mL/min — ABNORMAL LOW (ref >60)
GFR calc non Af Amer: 49 mL/min — ABNORMAL LOW (ref >60)
GLUCOSE: 89 mg/dL (ref 65–99)
Potassium: 4.4 mmol/L (ref 3.5–5.1)
Sodium: 124 mmol/L — ABNORMAL LOW (ref 135–145)

## 2017-07-06 LAB — TROPONIN I: Troponin I: <0.03 ng/mL (ref <0.03)

## 2017-07-06 LAB — OSMOLALITY, URINE: Osmolality, Ur: 422 mOsm/kg (ref 300–900)

## 2017-07-06 LAB — SODIUM, URINE, RANDOM: Sodium, Ur: 74 mmol/L

## 2017-07-06 LAB — TSH: TSH: 13.056 u[IU]/mL — ABNORMAL HIGH (ref 0.350–4.500)

## 2017-07-06 MED ORDER — PANTOPRAZOLE SODIUM 40 MG PO TBEC
40.0000 mg | DELAYED_RELEASE_TABLET | Freq: Every day | ORAL | Status: DC
  Administered 2017-07-07: 40 mg via ORAL
  Filled 2017-07-06: qty 1

## 2017-07-06 MED ORDER — SODIUM CHLORIDE 0.9 % IV BOLUS (SEPSIS)
1000.0000 mL | Freq: Once | INTRAVENOUS | Status: AC
  Administered 2017-07-06: 1000 mL via INTRAVENOUS

## 2017-07-06 MED ORDER — LOSARTAN POTASSIUM 50 MG PO TABS
100.0000 mg | ORAL_TABLET | Freq: Every day | ORAL | Status: DC
  Administered 2017-07-07: 100 mg via ORAL
  Filled 2017-07-06: qty 2

## 2017-07-06 MED ORDER — ATORVASTATIN CALCIUM 40 MG PO TABS
40.0000 mg | ORAL_TABLET | Freq: Every day | ORAL | Status: DC
  Administered 2017-07-07: 40 mg via ORAL
  Filled 2017-07-06: qty 1

## 2017-07-06 MED ORDER — CLOPIDOGREL BISULFATE 75 MG PO TABS
75.0000 mg | ORAL_TABLET | Freq: Every day | ORAL | Status: DC
  Administered 2017-07-07: 75 mg via ORAL
  Filled 2017-07-06: qty 1

## 2017-07-06 MED ORDER — ISOSORBIDE MONONITRATE ER 30 MG PO TB24
30.0000 mg | ORAL_TABLET | Freq: Every day | ORAL | Status: DC
  Administered 2017-07-07: 30 mg via ORAL
  Filled 2017-07-06: qty 1

## 2017-07-06 MED ORDER — FAMOTIDINE 20 MG PO TABS
20.0000 mg | ORAL_TABLET | Freq: Every day | ORAL | Status: DC
  Administered 2017-07-06 – 2017-07-07 (×2): 20 mg via ORAL
  Filled 2017-07-06 (×2): qty 1

## 2017-07-06 MED ORDER — SODIUM CHLORIDE 0.9% FLUSH
3.0000 mL | Freq: Two times a day (BID) | INTRAVENOUS | Status: DC
  Administered 2017-07-06: 3 mL via INTRAVENOUS

## 2017-07-06 MED ORDER — AMLODIPINE BESYLATE 2.5 MG PO TABS
2.5000 mg | ORAL_TABLET | Freq: Every day | ORAL | Status: DC
  Administered 2017-07-07: 2.5 mg via ORAL
  Filled 2017-07-06: qty 1

## 2017-07-06 MED ORDER — ACETAMINOPHEN 325 MG PO TABS
650.0000 mg | ORAL_TABLET | Freq: Four times a day (QID) | ORAL | Status: DC | PRN
  Administered 2017-07-06 – 2017-07-07 (×3): 650 mg via ORAL
  Filled 2017-07-06 (×3): qty 2

## 2017-07-06 MED ORDER — GI COCKTAIL ~~LOC~~
30.0000 mL | Freq: Once | ORAL | Status: AC
  Administered 2017-07-06: 30 mL via ORAL
  Filled 2017-07-06: qty 30

## 2017-07-06 MED ORDER — NITROGLYCERIN 0.4 MG SL SUBL: 0.4000 mg | SUBLINGUAL_TABLET | SUBLINGUAL | Status: DC | PRN

## 2017-07-06 MED ORDER — PREGABALIN 50 MG PO CAPS
50.0000 mg | ORAL_CAPSULE | Freq: Three times a day (TID) | ORAL | Status: DC
  Administered 2017-07-06 – 2017-07-07 (×2): 50 mg via ORAL
  Filled 2017-07-06 (×2): qty 1

## 2017-07-06 MED ORDER — ASPIRIN EC 81 MG PO TBEC
81.0000 mg | DELAYED_RELEASE_TABLET | Freq: Every day | ORAL | Status: DC
  Administered 2017-07-07: 81 mg via ORAL
  Filled 2017-07-06: qty 1

## 2017-07-06 MED ORDER — ONDANSETRON HCL 4 MG PO TABS: 4.0000 mg | ORAL_TABLET | Freq: Four times a day (QID) | ORAL | Status: DC | PRN

## 2017-07-06 MED ORDER — ONDANSETRON HCL 4 MG/2ML IJ SOLN
4.0000 mg | Freq: Four times a day (QID) | INTRAMUSCULAR | Status: DC | PRN
  Administered 2017-07-07: 4 mg via INTRAVENOUS
  Filled 2017-07-06: qty 2

## 2017-07-06 MED ORDER — HYDRALAZINE HCL 50 MG PO TABS
100.0000 mg | ORAL_TABLET | Freq: Three times a day (TID) | ORAL | Status: DC
  Administered 2017-07-06 – 2017-07-07 (×3): 100 mg via ORAL
  Filled 2017-07-06 (×3): qty 2

## 2017-07-06 MED ORDER — CARVEDILOL 6.25 MG PO TABS
6.2500 mg | ORAL_TABLET | Freq: Two times a day (BID) | ORAL | Status: DC
  Administered 2017-07-06 – 2017-07-07 (×2): 6.25 mg via ORAL
  Filled 2017-07-06 (×2): qty 1

## 2017-07-06 MED ORDER — ACETAMINOPHEN 650 MG RE SUPP: 650.0000 mg | Freq: Four times a day (QID) | RECTAL | Status: DC | PRN

## 2017-07-06 MED ORDER — ENOXAPARIN SODIUM 40 MG/0.4ML ~~LOC~~ SOLN
40.0000 mg | SUBCUTANEOUS | Status: DC
  Administered 2017-07-06: 40 mg via SUBCUTANEOUS
  Filled 2017-07-06 (×2): qty 0.4

## 2017-07-06 MED ORDER — LEVOTHYROXINE SODIUM 100 MCG PO TABS
100.0000 ug | ORAL_TABLET | ORAL | Status: DC
  Administered 2017-07-07: 100 ug via ORAL
  Filled 2017-07-06: qty 1

## 2017-07-06 NOTE — ED Notes (Signed)
Unsuccessful attempt at giving report to [facility].  Nurse just received an admission.  Nurse will call this nurse back when she is finished with admission.

## 2017-07-06 NOTE — ED Triage Notes (Signed)
Per EMS- pt had MI with stent placement September 1-3rd, began having burning sensation across her chest with tingling to bilateral arms, which is how she felt for her last MI. NSR on EKG by EMS. 324 Asprin given. Pain 5/10. Given 1 nitro. Pain 0/10. Pain coming back upon arrival. 20G PIV to right wrist placed by EMS.

## 2017-07-06 NOTE — ED Notes (Signed)
Pt placed on PureWick catheter per Vicente Males Investment banker, corporate)

## 2017-07-06 NOTE — ED Provider Notes (Signed)
Emergency Department Provider Note   I have reviewed the triage vital signs and the nursing notes.   HISTORY  Chief Complaint Chest Pain   HPI Tonya Cruz is a 83 y.o. female with a history of breast cancer, and STEMI status post stent approximately 2 weeks ago who presents immersed department with chest discomfort. Patient states that she started having burning across her chest and then associated with a different sharp pain between her breasts. She states is exactly how her last heart attack presented so she came here for further evaluation. She has severe nausea with this. She was eating when it started but does not think she has a history of indigestion. On her time she's had this is when she had her heart attack. No association was breath or lightheadedness. No rashes. No recent illnesses. Has not tried anything for the symptoms prior to arrival.   Past Medical History:  Diagnosis Date  . Anxiety   . Breast cancer ([place])    right side  . Coronary artery disease    06/11/17 PTCA/DES x1 to mLAD, CTO mRCA with col.   [name] GERD (gastroesophageal reflux disease)   . High cholesterol   . Hypertension   . NSTEMI (non-ST elevated myocardial infarction) ([place]) 06/11/2017  . Thyroid disease     Patient Active Problem List   Diagnosis Date Noted  . Chest pain 07/06/2017  . Acute hyponatremia 07/06/2017  . GERD (gastroesophageal reflux disease) 07/06/2017  . High cholesterol 07/06/2017  . Hypothyroidism 07/06/2017  . Hyperlipemia 06/12/2017  . CAD (coronary artery disease), native coronary artery 06/12/2017  . Hypertension 06/08/2017  . NSTEMI (non-ST elevated myocardial infarction) ([place]) 06/08/2017    Past Surgical History:  Procedure Laterality Date  . BREAST SURGERY    . CARDIAC CATHETERIZATION  06/11/2017  . CORONARY STENT INTERVENTION N/A 06/11/2017   Procedure: CORONARY STENT INTERVENTION;  Surgeon: Burnell Blanks, MD;  Location: [facility];   Service: Cardiovascular;  Laterality: N/A;  . LEFT HEART CATH AND CORONARY ANGIOGRAPHY N/A 06/11/2017   Procedure: LEFT HEART CATH AND CORONARY ANGIOGRAPHY;  Surgeon: Burnell Blanks, MD;  Location: [facility];  Service: Cardiovascular;  Laterality: N/A;  . roto cuff        Allergies Ciprofloxacin hcl and Penicillins  Family History  Problem Relation Age of Onset  . Diabetes Mother   . Other Father        cerebral hemorrhage    Social History Social History  Substance Use Topics  . Smoking status: Former Research scientist (life sciences)  . Smokeless tobacco: Never Used  . Alcohol use No    Review of Systems  All other systems negative except as documented in the HPI. All pertinent positives and negatives as reviewed in the HPI. ____________________________________________   PHYSICAL EXAM:  VITAL SIGNS: ED Triage Vitals [07/06/17 1027]  Enc Vitals Group     BP (!) 182/52     Pulse Rate 61     Resp 17     Temp 98.2 F (36.8 C)     Temp Source Oral     SpO2 100 %     Weight      Height      Head Circumference      Peak Flow      Pain Score      Pain Loc      Pain Edu?      Excl. in [place]?     Constitutional: Alert and oriented. Well  appearing and in no acute distress. Eyes: Conjunctivae are normal. PERRL. EOMI. Head: Atraumatic. Nose: No congestion/rhinnorhea. Mouth/Throat: Mucous membranes are moist.  Oropharynx non-erythematous. Neck: No stridor.  No meningeal signs.   Cardiovascular: Normal rate, regular rhythm. Good peripheral circulation. Grossly normal heart sounds.   Respiratory: Normal respiratory effort.  No retractions. Lungs CTAB. Gastrointestinal: Soft and nontender. No distention.  Musculoskeletal: No lower extremity tenderness nor edema. No gross deformities of extremities. Neurologic:  Normal speech and language. No gross focal neurologic deficits are appreciated.  Skin:  Skin is warm, dry and intact. No rash  noted.   ____________________________________________   LABS (all labs ordered are listed, but only abnormal results are displayed)  Labs Reviewed  BASIC METABOLIC PANEL - Abnormal; Notable for the following:       Result Value   Sodium 124 (*)    Chloride 93 (*)    Calcium 8.8 (*)    GFR calc non Af Amer 49 (*)    GFR calc Af Amer 57 (*)    All other components within normal limits  CBC - Abnormal; Notable for the following:    Hemoglobin 11.5 (*)    HCT 35.3 (*)    All other components within normal limits  TSH - Abnormal; Notable for the following:    TSH 13.056 (*)    All other components within normal limits  OSMOLALITY, URINE  SODIUM, URINE, RANDOM  URINALYSIS, ROUTINE W REFLEX MICROSCOPIC  TROPONIN I  MAGNESIUM  PHOSPHORUS  TROPONIN I  TROPONIN I  T3  T4, FREE  CORTISOL  BASIC METABOLIC PANEL  I-STAT TROPONIN, ED   ____________________________________________  EKG   EKG Interpretation  Date/Time:  Saturday July 06 2017 10:23:10 EDT Ventricular Rate:  63 PR Interval:    QRS Duration: 80 QT Interval:  394 QTC Calculation: 404 R Axis:   64 Text Interpretation:  Sinus rhythm Short PR interval Minimal ST depression, diffuse leads no obovious changes from 06/12/17 Confirmed by Merrily Pew [phone]) on 07/06/2017 11:14:36 AM       ____________________________________________  RADIOLOGY  Dg Chest 2 View  Result Date: 07/06/2017 CLINICAL DATA:  Patient complains of chest pains across both sides of her chest. And a burning sensation across the top of her chest. Symptoms come off and on since yesterday. No SOB. CHF. Some sinus drainage, no cough or congestion. EXAM: CHEST  2 VIEW COMPARISON:  06/08/2017 FINDINGS: Heart size is normal. The lungs are free of focal consolidations and pleural effusions. No pulmonary edema. Coarse calcification is again identified in medial aspect of the right breast overlying the right lung base. No suspicious pulmonary  nodules. IMPRESSION: No evidence for acute cardiopulmonary abnormality. Electronically Signed   By: Nolon Nations M.D.   On: 07/06/2017 11:17    ____________________________________________   PROCEDURES  Procedure(s) performed:   Procedures   ____________________________________________   INITIAL IMPRESSION / ASSESSMENT AND PLAN / ED COURSE  Pertinent labs & imaging results that were available during my care of the patient were reviewed by me and considered in my medical decision making (see chart for details).  No significant EKG changes. Initial troponin 0.01. Concerning process*is that the patient states is exactly like her previous ACS event so we'll consult cardiology for further workup. I will also give a GI cocktail and nitroglycerin in the meantime.  Improved symptoms. Cardiology consulted, recommends medicine admission.   Discussed with Bryson Ha with [facility]ad who will admit.   ____________________________________________  FINAL CLINICAL IMPRESSION(S) / ED DIAGNOSES  Final diagnoses:  Nonspecific chest pain  Hyponatremia     MEDICATIONS GIVEN DURING THIS VISIT:  Medications  nitroGLYCERIN (NITROSTAT) SL tablet 0.4 mg (not administered)  pantoprazole (PROTONIX) EC tablet 40 mg (not administered)  amLODipine (NORVASC) tablet 2.5 mg (not administered)  aspirin EC tablet 81 mg (not administered)  atorvastatin (LIPITOR) tablet 40 mg (not administered)  carvedilol (COREG) tablet 6.25 mg (not administered)  clopidogrel (PLAVIX) tablet 75 mg (not administered)  hydrALAZINE (APRESOLINE) tablet 100 mg (not administered)  isosorbide mononitrate (IMDUR) 24 hr tablet 30 mg (not administered)  levothyroxine (SYNTHROID, LEVOTHROID) tablet 100 mcg (not administered)  losartan (COZAAR) tablet 100 mg (not administered)  famotidine (PEPCID) tablet 20 mg (not administered)  pregabalin (LYRICA) capsule 50 mg (not administered)  enoxaparin (LOVENOX) injection 40 mg (not  administered)  sodium chloride flush (NS) 0.9 % injection 3 mL (not administered)  acetaminophen (TYLENOL) tablet 650 mg (not administered)    Or  acetaminophen (TYLENOL) suppository 650 mg (not administered)  ondansetron (ZOFRAN) tablet 4 mg (not administered)    Or  ondansetron (ZOFRAN) injection 4 mg (not administered)  gi cocktail (Maalox,Lidocaine,Donnatal) (30 mLs Oral Given 07/06/17 1125)  sodium chloride 0.9 % bolus 1,000 mL (0 mLs Intravenous Stopped 07/06/17 1620)     NEW OUTPATIENT MEDICATIONS STARTED DURING THIS VISIT:  Current Discharge Medication List      Note:  This document was prepared using Dragon voice recognition software and may include unintentional dictation errors.   Merrily Pew, MD 07/06/17 [phone]

## 2017-07-06 NOTE — Progress Notes (Signed)
Code status clarified with pt. Pt states in the event of Cardio pulmonary arrest, she would want CPR, Defibrillation, ACLS meds, intubation, and other emergent measures. Dr. Roderic Palau notified. Okay to change code status Full Code per Dr. Roderic Palau.

## 2017-07-06 NOTE — H&P (Signed)
History and Physical    [name]:[id number] DOB: 04-Apr-1924 DOA: 07/06/2017   PCP: Annamarie Major, MD Tonya Cruz  Attending physician: Memon  Patient coming from/Resides with: Private residence  Chief Complaint: Burning chest discomfort and generalized weakness  HPI: Tonya Cruz is a 83 y.o. female with medical history significant for CAD with recent cardiac stent on 9/3, low-grade chronic hyponatremia, GERD, dyslipidemia, depression and hypothyroidism. Also remote history of breast cancer. Patient presented to the ER primarily because of complaints of chest discomfort that was burning in quality and she reported this was similar to her previous MI. She was initially evaluated by cardiology while in the ER who felt that her symptoms were vague in nature and not representative of ischemic pain but recommended to continue cycling her enzymes and monitor for worsening symptoms. Her sodium was found to be low at 124 with her typical baseline between 130 and 132 therefore the rationale for admission.  ED Course:  Vital Signs: BP (!) 149/85   Pulse (!) 58   Temp 98.2 F (36.8 C) (Oral)   Resp 15   SpO2 98%  Chest x-ray: Neg Lab data: Sodium 124, potassium 4.4, chloride 93, CO2 23, glucose 89, BUN 16, gr 0.98, anion gap 8, poc troponin 0.01, white count 9400 differential not obtained, hemoglobin 1.5, platelets 168,000 Medications and treatments: GI cocktail 1  Review of Systems:  In addition to the HPI above,  No Fever-chills, myalgias or other constitutional symptoms No Headache, changes with Vision or hearing, new weakness, tingling, numbness in any extremity, dizziness, dysarthria or word finding difficulty, gait disturbance or imbalance, tremors or seizure activity No problems swallowing food or Liquids, indigestion/reflux, choking or coughing while eating, abdominal pain with or after eating No Cough or Shortness of Breath, palpitations, orthopnea or DOE No  Abdominal pain, N/V, melena,hematochezia, dark tarry stools, constipation-recent issues with abdominal bloating now resolved after the addition of a laxative No dysuria, malodorous urine, hematuria or flank pain No new skin rashes, lesions, masses or bruises, No new joint pains, aches, swelling or redness No recent unintentional weight gain or loss No polyuria, polydypsia or polyphagia   Past Medical History:  Diagnosis Date  . Anxiety   . Breast cancer ([place])    right side  . Coronary artery disease    06/11/17 PTCA/DES x1 to mLAD, CTO mRCA with col.   [name] GERD (gastroesophageal reflux disease)   . High cholesterol   . Hypertension   . NSTEMI (non-ST elevated myocardial infarction) ([place]) 06/11/2017  . Thyroid disease     Past Surgical History:  Procedure Laterality Date  . BREAST SURGERY    . CARDIAC CATHETERIZATION  06/11/2017  . CORONARY STENT INTERVENTION N/A 06/11/2017   Procedure: CORONARY STENT INTERVENTION;  Surgeon: Burnell Blanks, MD;  Location: [facility];  Service: Cardiovascular;  Laterality: N/A;  . LEFT HEART CATH AND CORONARY ANGIOGRAPHY N/A 06/11/2017   Procedure: LEFT HEART CATH AND CORONARY ANGIOGRAPHY;  Surgeon: Burnell Blanks, MD;  Location: [facility];  Service: Cardiovascular;  Laterality: N/A;  . roto cuff      Social History   Social History  . Marital status: Widowed    Spouse name: N/A  . Number of children: N/A  . Years of education: N/A   Occupational History  . Not on file.   Social History Main Topics  . Smoking status: Former Research scientist (life sciences)  . Smokeless tobacco: Never Used  . Alcohol use No  .  Drug use: No  . Sexual activity: Not Currently   Other Topics Concern  . Not on file   Social History Narrative  . No narrative on file    Mobility: Rolling walker Work history: Not obtained   Allergies  Allergen Reactions  . Ciprofloxacin Hcl   . Penicillins     Has patient had a PCN reaction causing immediate  rash, facial/tongue/throat swelling, SOB or lightheadedness with hypotension: No Has patient had a PCN reaction causing severe rash involving mucus membranes or skin necrosis: No Has patient had a PCN reaction that required hospitalization: No Has patient had a PCN reaction occurring within the last 10 years: No If all of the above answers are "NO", then may proceed with Cephalosporin use.    Family History  Problem Relation Age of Onset  . Diabetes Mother   . Other Father        cerebral hemorrhage     Prior to Admission medications   Medication Sig Start Date End Date Taking? Authorizing Provider  aspirin EC 81 MG tablet Take 81 mg by mouth daily.   Yes [provider]  atorvastatin (LIPITOR) 40 MG tablet Take 1 tablet (40 mg total) by mouth daily. 06/28/17 09/26/17 Yes Isaiah Serge, NP  carvedilol (COREG) 6.25 MG tablet Take 1 tablet (6.25 mg total) by mouth 2 (two) times daily. 06/28/17  Yes Isaiah Serge, NP  citalopram (CELEXA) 10 MG tablet Take 10 mg by mouth at bedtime. 06/04/17  Yes [provider]  denosumab (PROLIA) 60 MG/ML SOLN injection Inject 60 mg into the skin every 6 (six) months. Administer in upper arm, thigh, or abdomen   Yes [provider]  levothyroxine (SYNTHROID, LEVOTHROID) 100 MCG tablet Take 100 mcg by mouth every morning.  04/04/17  Yes [provider]  pregabalin (LYRICA) 50 MG capsule Take 50 mg by mouth 3 (three) times daily.   Yes [provider]  acetaminophen (TYLENOL) 325 MG tablet Take 650 mg by mouth every 4 (four) hours as needed for mild pain or moderate pain.    [provider]  amLODipine (NORVASC) 2.5 MG tablet Take 1 tablet (2.5 mg total) by mouth daily. 06/28/17 09/26/17  Isaiah Serge, NP  clopidogrel (PLAVIX) 75 MG tablet Take 1 tablet (75 mg total) by mouth daily with breakfast. 06/13/17   Reino Bellis B, NP  hydrALAZINE (APRESOLINE) 100 MG tablet Take 100 mg by mouth 3 (three) times  daily.    [provider]  isosorbide mononitrate (IMDUR) 30 MG 24 hr tablet Take 1 tablet (30 mg total) by mouth daily. 06/28/17 09/26/17  Isaiah Serge, NP  losartan (COZAAR) 100 MG tablet Take 100 mg by mouth daily. 05/16/17   [provider]  nitroGLYCERIN (NITROSTAT) 0.4 MG SL tablet Place 1 tablet (0.4 mg total) under the tongue every 5 (five) minutes x 3 doses as needed for chest pain. Patient not taking: Reported on 07/06/2017 06/12/17   Reino Bellis B, NP  ranitidine (ZANTAC) 150 MG tablet Take 150 mg by mouth 2 (two) times daily. 05/27/17   [provider]    Physical Exam: Vitals:   07/06/17 1030 07/06/17 1100 07/06/17 1115 07/06/17 1130  BP: (!) 159/54  (!) 158/56 (!) 149/85  Pulse: 60 61 (!) 58 (!) 58  Resp: 20 20  15   Temp:      TempSrc:      SpO2: 98% 97% 98% 98%      Constitutional: NAD, calm,  comfortable Eyes: PERRL, lids and conjunctivae normal ENMT: Mucous membranes are moist. Posterior pharynx clear of any exudate or lesions. Normal dentition.  Neck: normal, supple, no masses, no thyromegaly Respiratory: clear to auscultation bilaterally, no wheezing, no crackles. Normal respiratory effort. No accessory muscle use.  Cardiovascular: Regular rate and rhythm, no murmurs / rubs / gallops. No extremity edema. 2+ pedal pulses. No carotid bruits.  Abdomen: no tenderness, no masses palpated. No hepatosplenomegaly. Bowel sounds positive.  Musculoskeletal: no clubbing / cyanosis. No joint deformity upper and lower extremities. Good ROM, no contractures. Normal muscle tone.  Skin: no rashes, lesions, ulcers. No induration Neurologic: CN 2-12 grossly intact. Sensation intact, DTR normal. Strength 5/5 x all 4 extremities.  Psychiatric: Normal judgment and insight. Alert and oriented x 3. Normal mood.    Labs on Admission: I have personally reviewed following labs and imaging studies  CBC:  Recent Labs Lab 07/06/17 1032  WBC 9.4  HGB 11.5*    HCT 35.3*  MCV 87.6  PLT 315   Basic Metabolic Panel:  Recent Labs Lab 07/06/17 1032  NA 124*  K 4.4  CL 93*  CO2 23  GLUCOSE 89  BUN 16  CREATININE 0.98  CALCIUM 8.8*   GFR: Estimated Creatinine Clearance: 38.2 mL/min (by C-G formula based on SCr of 0.98 mg/dL). Liver Function Tests: No results for input(s): AST, ALT, ALKPHOS, BILITOT, PROT, ALBUMIN in the last 168 hours. No results for input(s): LIPASE, AMYLASE in the last 168 hours. No results for input(s): AMMONIA in the last 168 hours. Coagulation Profile: No results for input(s): INR, PROTIME in the last 168 hours. Cardiac Enzymes: No results for input(s): CKTOTAL, CKMB, CKMBINDEX, TROPONINI in the last 168 hours. BNP (last 3 results) No results for input(s): PROBNP in the last 8760 hours. HbA1C: No results for input(s): HGBA1C in the last 72 hours. CBG: No results for input(s): GLUCAP in the last 168 hours. Lipid Profile: No results for input(s): CHOL, HDL, LDLCALC, TRIG, CHOLHDL, LDLDIRECT in the last 72 hours. Thyroid Function Tests: No results for input(s): TSH, T4TOTAL, FREET4, T3FREE, THYROIDAB in the last 72 hours. Anemia Panel: No results for input(s): VITAMINB12, FOLATE, FERRITIN, TIBC, IRON, RETICCTPCT in the last 72 hours. Urine analysis: No results found for: COLORURINE, APPEARANCEUR, LABSPEC, PHURINE, GLUCOSEU, HGBUR, BILIRUBINUR, KETONESUR, PROTEINUR, UROBILINOGEN, NITRITE, LEUKOCYTESUR Sepsis Labs: @LABRCNTIP (procalcitonin:4,lacticidven:4) )No results found for this or any previous visit (from the past 240 hour(s)).   Radiological Exams on Admission: Dg Chest 2 View  Result Date: 07/06/2017 CLINICAL DATA:  Patient complains of chest pains across both sides of her chest. And a burning sensation across the top of her chest. Symptoms come off and on since yesterday. No SOB. CHF. Some sinus drainage, no cough or congestion. EXAM: CHEST  2 VIEW COMPARISON:  06/08/2017 FINDINGS: Heart size is normal.  The lungs are free of focal consolidations and pleural effusions. No pulmonary edema. Coarse calcification is again identified in medial aspect of the right breast overlying the right lung base. No suspicious pulmonary nodules. IMPRESSION: No evidence for acute cardiopulmonary abnormality. Electronically Signed   By: Nolon Nations M.D.   On: 07/06/2017 11:17    EKG: (Independently reviewed) sinus rhythm ventricular rate 63 bpm, QTC 44 ms, no definitive acute ischemic changes, normal R-wave rotation  Assessment/Plan Principal Problem:   Acute hyponatremia -Patient presents with atypical chest discomfort and was found to have worsening hyponatremia with sodium now 124 from a baseline of 130-132 -Patient admits to typically drinking between two-three  16 ounce bottles of water daily plus an additional 1-2 cups of water to take medications; does not drink beer or other alcoholic beverages -Is not on diuretics and does not have peripheral edema - do not think heart failure as etiology -Does have underlying hypothyroidism -check TSH -DC Celexa -1200 mL fluid restriction -Urinary sodium and osmolality, serum osmolality, urinalysis to rule out SIADH  Active Problems:   CAD (coronary artery disease), native coronary artery/Chest pain -DES to LAD on 9/3 -Management per cardiology who recommended to continue cycle troponin -Apparently with recurrent chest pain since stent placement and Imdur was recently added at outpatient visit   Hypertension -Recent issues systolic hypertension with Norvasc added on 9/21 at outpatient visit -During that visit patient was bradycardic and carvedilol was decreased to 6.25 mg twice a day -Continue hydralazine and Imdur    Hypothyroidism -Continue Synthroid -Follow up on TSH    GERD (gastroesophageal reflux disease)  -GI cocktail given with improvement in symptoms -Continue Zantac -Add PPI    High cholesterol -Continue Lipitor    Depression -Celexa  discontinued in setting of worsening hyponatremia -Was very low-dose and therefore may not need continued pharmacological      DVT prophylaxis: Lovenox Code Status: DO NOT RESUSCITATE Family Communication: No family at bedside  Disposition Plan: Home Consults called: Cardiology/[place]    Leyan Branden L. ANP-BC [facility] Pager [phone]   If 7PM-7AM, please contact night-coverage [email or web address] Password [email or web address]  07/06/2017, 2:12 PM

## 2017-07-06 NOTE — Consult Note (Signed)
Cardiology Consult    Patient ID: DESIRE FULP MRN: [id number], DOB/AGE: [birth year]   Consult date: 07/06/2017   Primary Physician: Annamarie Major, MD Primary Cardiologist: Dr. Tamala Julian  Patient Profile    Tonya SHANAFELT is a 83 y.o. female with past medical history of CAD (s/p NSTEMI earlier this month with DES to prox-LAD), HTN, HLD, and history of chest pain who presents to [facility] ED on 07/06/2017 for evaluation of chest pain at the request of Dr. Dayna Barker.   History of Present Illness    She was recently admitted to [facility] from 9/1 - 06/12/2017 for an NSTEMI. Troponin values peaked at 0.79. Catheterization showed a chronic occlusion of the mid-RCA with filling of the RCA by collaterals and 99% Prox LAD stenosis. She underwent PCI with placement of a DES to the mid-LAD and was started on DAPT with ASA and Plavix along with being continued on BB and statin therapy.   At the time of her office visit on 06/28/2017, she reported SBP had been in the low-200's at times. Amlodipine 2.5mg  daily was added at that time.  HR was in the 50's therefore Coreg was reduced to 6.25mg  BID. She noted fatigue but denied any chest pain or dyspnea.   In talking with the patient today, she reports being discharged from rehab on Thursday and returning home. Has a caregiver who is assisting around the house. Yesterday, she started to feel weak and fatigued. This morning, after consuming breakfast, she developed a tingling sensation along her right and left upper arms and radiating across her entire chest. Reports similar symptoms when she had her MI earlier this month. At the time of her NSTEMI, she also had a significant pressure along her sternum and she denies any repeat episodes of this specific pain. Notes mild nausea. No dyspnea, diaphoresis, vomiting, orthopnea, PND or lower extremity edema. No recent hematuria, melena, or hematochezia.    Initial labs show WBC of 9.4, Hgb 11.5, platelets 168. Na+  124, K+ 4.4, and creatinine 0.98. Initial troponin negative. CXR shows no acute cardiopulmonary abnormalities. EKG shows NSR, HR 63, with slight lateral TWI (similar to prior tracings).  Past Medical History    Past Medical History:  Diagnosis Date  . Anxiety   . Breast cancer ([place])    right side  . Coronary artery disease    06/11/17 PTCA/DES x1 to mLAD, CTO mRCA with col.   [name] GERD (gastroesophageal reflux disease)   . High cholesterol   . Hypertension   . NSTEMI (non-ST elevated myocardial infarction) ([place]) 06/11/2017  . Thyroid disease     Past Surgical History:  Procedure Laterality Date  . BREAST SURGERY    . CARDIAC CATHETERIZATION  06/11/2017  . CORONARY STENT INTERVENTION N/A 06/11/2017   Procedure: CORONARY STENT INTERVENTION;  Surgeon: Burnell Blanks, MD;  Location: [facility];  Service: Cardiovascular;  Laterality: N/A;  . LEFT HEART CATH AND CORONARY ANGIOGRAPHY N/A 06/11/2017   Procedure: LEFT HEART CATH AND CORONARY ANGIOGRAPHY;  Surgeon: Burnell Blanks, MD;  Location: [facility];  Service: Cardiovascular;  Laterality: N/A;  . roto cuff       Allergies  Allergies  Allergen Reactions  . Ciprofloxacin Hcl   . Penicillins     Has patient had a PCN reaction causing immediate rash, facial/tongue/throat swelling, SOB or lightheadedness with hypotension: No Has patient had a PCN reaction causing severe rash involving mucus membranes or skin necrosis: No Has  patient had a PCN reaction that required hospitalization: No Has patient had a PCN reaction occurring within the last 10 years: No If all of the above answers are "NO", then may proceed with Cephalosporin use.     Home Medications    Prior to Admission medications   Medication Sig Start Date End Date Taking? Authorizing Provider  aspirin EC 81 MG tablet Take 81 mg by mouth daily.   Yes [provider]  atorvastatin (LIPITOR) 40 MG tablet Take 1 tablet (40 mg total) by  mouth daily. 06/28/17 09/26/17 Yes Isaiah Serge, NP  carvedilol (COREG) 6.25 MG tablet Take 1 tablet (6.25 mg total) by mouth 2 (two) times daily. 06/28/17  Yes Isaiah Serge, NP  citalopram (CELEXA) 10 MG tablet Take 10 mg by mouth at bedtime. 06/04/17  Yes [provider]  denosumab (PROLIA) 60 MG/ML SOLN injection Inject 60 mg into the skin every 6 (six) months. Administer in upper arm, thigh, or abdomen   Yes [provider]  levothyroxine (SYNTHROID, LEVOTHROID) 100 MCG tablet Take 100 mcg by mouth every morning.  04/04/17  Yes [provider]  pregabalin (LYRICA) 50 MG capsule Take 50 mg by mouth 3 (three) times daily.   Yes [provider]  acetaminophen (TYLENOL) 325 MG tablet Take 650 mg by mouth every 4 (four) hours as needed for mild pain or moderate pain.    [provider]  amLODipine (NORVASC) 2.5 MG tablet Take 1 tablet (2.5 mg total) by mouth daily. 06/28/17 09/26/17  Isaiah Serge, NP  clopidogrel (PLAVIX) 75 MG tablet Take 1 tablet (75 mg total) by mouth daily with breakfast. 06/13/17   Reino Bellis B, NP  hydrALAZINE (APRESOLINE) 100 MG tablet Take 100 mg by mouth 3 (three) times daily.    [provider]  isosorbide mononitrate (IMDUR) 30 MG 24 hr tablet Take 1 tablet (30 mg total) by mouth daily. 06/28/17 09/26/17  Isaiah Serge, NP  losartan (COZAAR) 100 MG tablet Take 100 mg by mouth daily. 05/16/17   [provider]  nitroGLYCERIN (NITROSTAT) 0.4 MG SL tablet Place 1 tablet (0.4 mg total) under the tongue every 5 (five) minutes x 3 doses as needed for chest pain. Patient not taking: Reported on 07/06/2017 06/12/17   Reino Bellis B, NP  ranitidine (ZANTAC) 150 MG tablet Take 150 mg by mouth 2 (two) times daily. 05/27/17   [provider]    Family History    Family History  Problem Relation Age of Onset  . Diabetes Mother   . Other Father        cerebral hemorrhage    Social History    Social  History   Social History  . Marital status: Widowed    Spouse name: N/A  . Number of children: N/A  . Years of education: N/A   Occupational History  . Not on file.   Social History Main Topics  . Smoking status: Former Research scientist (life sciences)  . Smokeless tobacco: Never Used  . Alcohol use No  . Drug use: No  . Sexual activity: Not Currently   Other Topics Concern  . Not on file   Social History Narrative  . No narrative on file     Review of Systems    General:  No chills, fever, night sweats or weight changes.  Cardiovascular:  No dyspnea on exertion, edema, orthopnea, palpitations, paroxysmal nocturnal dyspnea. Positive for chest pain.  Dermatological: No rash, lesions/masses Respiratory: No cough, dyspnea  Urologic: No hematuria, dysuria Abdominal:   No nausea, vomiting, diarrhea, bright red blood per rectum, melena, or hematemesis Neurologic:  No visual changes, wkns, changes in mental status. All other systems reviewed and are otherwise negative except as noted above.  Physical Exam    Blood pressure (!) 149/85, pulse (!) 58, temperature 98.2 F (36.8 C), temperature source Oral, resp. rate 15, SpO2 98 %.  General: Well developed, well nourished Caucasian female appearing in no acute distress. Head: Normocephalic, atraumatic, sclera non-icteric, no xanthomas, nares are without discharge. Dentition:  Neck: No carotid bruits. JVD not elevated.  Lungs: Respirations regular and unlabored, without wheezes or rales.  Heart: Regular rate and rhythm. No S3 or S4.  No murmur, no rubs, or gallops appreciated. Abdomen: Soft, non-tender, non-distended with normoactive bowel sounds. No hepatomegaly. No rebound/guarding. No obvious abdominal masses. Msk:  Strength and tone appear normal for age. No joint deformities or effusions. Extremities: No clubbing or cyanosis. No lower extremity edema.  Distal pedal pulses are 2+ bilaterally. Neuro: Alert and oriented X 3. Moves all extremities  spontaneously. No focal deficits noted. Psych:  Responds to questions appropriately with a normal affect. Skin: No rashes or lesions noted  Labs    Troponin (Point of Care Test)  Recent Labs  07/06/17 1036  TROPIPOC 0.01   No results for input(s): CKTOTAL, CKMB, TROPONINI in the last 72 hours. Lab Results  Component Value Date   WBC 9.4 07/06/2017   HGB 11.5 (L) 07/06/2017   HCT 35.3 (L) 07/06/2017   MCV 87.6 07/06/2017   PLT 168 07/06/2017    Recent Labs Lab 07/06/17 1032  NA 124*  K 4.4  CL 93*  CO2 23  BUN 16  CREATININE 0.98  CALCIUM 8.8*  GLUCOSE 89   Lab Results  Component Value Date   CHOL 112 06/09/2017   HDL 40 (L) 06/09/2017   LDLCALC 52 06/09/2017   TRIG 99 06/09/2017   Lab Results  Component Value Date   DDIMER 0.58 (H) 06/08/2017     B Natriuretic Peptide  Date/Time Value Ref Range Status  06/11/2017 12:34 PM 258.8 (H) 0.0 - 100.0 pg/mL Final  06/08/2017 05:22 PM 262.7 (H) 0.0 - 100.0 pg/mL Final     Radiology Studies    Dg Chest 2 View  Result Date: 07/06/2017 CLINICAL DATA:  Patient complains of chest pains across both sides of her chest. And a burning sensation across the top of her chest. Symptoms come off and on since yesterday. No SOB. CHF. Some sinus drainage, no cough or congestion. EXAM: CHEST  2 VIEW COMPARISON:  06/08/2017 FINDINGS: Heart size is normal. The lungs are free of focal consolidations and pleural effusions. No pulmonary edema. Coarse calcification is again identified in medial aspect of the right breast overlying the right lung base. No suspicious pulmonary nodules. IMPRESSION: No evidence for acute cardiopulmonary abnormality. Electronically Signed   By: Nolon Nations M.D.   On: 07/06/2017 11:17   Dg Chest 2 View  Result Date: 06/08/2017 CLINICAL DATA:  Chest pain and shortness of breath for 2 weeks EXAM: CHEST  2 VIEW COMPARISON:  Feb 23, 2014 FINDINGS: The heart size and mediastinal contours are within normal  limits. There is no focal infiltrate, pulmonary edema, or pleural effusion. The visualized skeletal structures are stable. IMPRESSION: No active cardiopulmonary disease. Electronically Signed   By: Abelardo Diesel M.D.   On: 06/08/2017 17:50    EKG & Cardiac Imaging    EKG:  NSR, HR 63, with slight lateral TWI (similar to prior tracings). - Personally Reviewed  ECHOCARDIOGRAM: Study Conclusions  - Left ventricle: The cavity size was normal. Wall thickness was   normal. Systolic function was normal. The estimated ejection   fraction was in the range of 60% to 65%. Wall motion was normal;   there were no regional wall motion abnormalities. Doppler   parameters are consistent with pseudonormal left ventricular   relaxation (grade 2 diastolic dysfunction). The E/A ratio is   >1.5. The E/e&' ratio is >15, suggesting elevated LV filling   pressure. - Aortic valve: Trileaflet. Sclerosis without stenosis. There was   mild regurgitation. - Mitral valve: Mildly thickened leaflets . There was mild   regurgitation. - Left atrium: The atrium was mildly dilated. - Tricuspid valve: There was mild regurgitation. - Pulmonary arteries: PA peak pressure: 52 mm Hg (S). - Inferior vena cava: The vessel was normal in size. The   respirophasic diameter changes were in the normal range (>= 50%),   consistent with normal central venous pressure.  Impressions:  - LVEF 60-65%, normal wall thickness, normal wall motion, grade 2   DD with elevated LV filling pressure, aortic valve sclerosis with   mild AI, mild MR, mild LAE, mild TR, RVSP 52 mmHg, normal IVC.  Cardiac Catheterization: 06/11/2017  Mid RCA lesion, 100 %stenosed.  A STENT SYNERGY DES 3X16 drug eluting stent was successfully placed.  Prox LAD to Mid LAD lesion, 99 %stenosed.  Post intervention, there is a 0% residual stenosis.  Mid LAD lesion, 30 %stenosed.  Dist LAD lesion, 40 %stenosed.   1. NSTEMI secondary to severe stenosis in  the mid LAD 2. Chronic occlusion mid RCA with filling of distal RCA by brisk left to right collaterals.  3. Successful PTCA/DES x 1 mid LAD  Recommendations: Will continue DAPT with ASA and Plavix for at least one year. Continue beta blocker and statin.   Assessment & Plan    1. Atypical Chest Pain - she presents with a tingling sensation along her right and left upper arms which radiates across her entire chest. Reports similar symptoms when she had her MI earlier this month but at that time she also had a significant pressure along her sternum and she denies any repeat episodes of this specific pain.  - initial troponin negative. CXR shows no acute cardiopulmonary abnormalities. EKG without any acute changes.  - would cycle troponin values. Repeat EKG in AM. Would not pursue further ischemic evaluation unless she rules-in. Can further titrate Imdur in needed but her current symptoms seem atypical for a cardiac etiology and did not improve with the recent initiation of Imdur at her outpatient visit.   2. CAD - she is s/p NSTEMI earlier this month with DES to prox-LAD as cath at that time showed a chronic occlusion of the mid-RCA with filling of the RCA by collaterals and 99% Prox LAD stenosis. She underwent PCI with placement of a DES to the mid-LAD.  - she reports good compliance with ASA and Plavix, denying missing any recent doses.  - continue ASA, Plavix, BB, statin, and Imdur.   3. HTN - BP well-controlled while in the ED.  - continue PTA medication regimen.   4. HLD - Lipid Panel in 06/2017 showed total cholesterol of 112, HDL 40, and LDL 52. AT goal of LDL < 70. - continue Atorvastatin 40mg  daily.   5. Hyponatremia/ Generalized Weakness - Na+ at 124.  - recommend admission and further  workup of this by the Hospitalist Team.    Signed, Erma Heritage, PA-C 07/06/2017, 12:02 PM Pager: [phone]  Patient seen and examined, complete records reviewed lab reviewed and  previous hospitalization reviewed.  The patient was discharged Thursday from a nursing home and has had continued discomfort involving her arms as well as a tingling sensation across her chest.  She was anxious about this and came to the emergency room today.  Initial troponins are normal.  Blood pressure has been reasonable.  She reports a hospitalization 3 years ago for hyponatremia however there is none listed in the records.  She was found to have a sodium of 124.  Elderly female currently in no acute distress lungs were clear cardiovascular exam showed normal S1 and S2 and there is no S3  Impressions:  1.  Vague symptoms that are very difficult to understand  She had what was felt to be continued anginal type pain and had Imdur added recently.  Recent stenting of the LAD but has a total occlusion of her right coronary artery with some collaterals that could be responsible for angina 2.  Hyponatremia needs workup  Agree with recommendations above.  Continue current medicines and hospitalist team to workup hyponatremia that may be responsible for some of her weakness.  Kerry Hough MD [facility] 1:36 PM

## 2017-07-07 ENCOUNTER — Encounter (HOSPITAL_COMMUNITY): Payer: Self-pay

## 2017-07-07 DIAGNOSIS — I2582 Chronic total occlusion of coronary artery: Secondary | ICD-10-CM | POA: Diagnosis not present

## 2017-07-07 DIAGNOSIS — Z853 Personal history of malignant neoplasm of breast: Secondary | ICD-10-CM | POA: Diagnosis not present

## 2017-07-07 DIAGNOSIS — Z7902 Long term (current) use of antithrombotics/antiplatelets: Secondary | ICD-10-CM | POA: Diagnosis not present

## 2017-07-07 DIAGNOSIS — E871 Hypo-osmolality and hyponatremia: Principal | ICD-10-CM

## 2017-07-07 DIAGNOSIS — R0789 Other chest pain: Secondary | ICD-10-CM | POA: Diagnosis not present

## 2017-07-07 DIAGNOSIS — I252 Old myocardial infarction: Secondary | ICD-10-CM | POA: Diagnosis not present

## 2017-07-07 DIAGNOSIS — I509 Heart failure, unspecified: Secondary | ICD-10-CM | POA: Diagnosis not present

## 2017-07-07 DIAGNOSIS — F419 Anxiety disorder, unspecified: Secondary | ICD-10-CM | POA: Diagnosis not present

## 2017-07-07 DIAGNOSIS — I110 Hypertensive heart disease with heart failure: Secondary | ICD-10-CM | POA: Diagnosis not present

## 2017-07-07 DIAGNOSIS — Z7982 Long term (current) use of aspirin: Secondary | ICD-10-CM | POA: Diagnosis not present

## 2017-07-07 DIAGNOSIS — Z79899 Other long term (current) drug therapy: Secondary | ICD-10-CM | POA: Diagnosis not present

## 2017-07-07 DIAGNOSIS — E7800 Pure hypercholesterolemia, unspecified: Secondary | ICD-10-CM | POA: Diagnosis not present

## 2017-07-07 DIAGNOSIS — I25119 Atherosclerotic heart disease of native coronary artery with unspecified angina pectoris: Secondary | ICD-10-CM | POA: Diagnosis not present

## 2017-07-07 LAB — BASIC METABOLIC PANEL
ANION GAP: 7 (ref 5–15)
BUN: 16 mg/dL (ref 6–20)
CALCIUM: 8.3 mg/dL — AB (ref 8.9–10.3)
CHLORIDE: 99 mmol/L — AB (ref 101–111)
CO2: 23 mmol/L (ref 22–32)
Creatinine, Ser: 1.15 mg/dL — ABNORMAL HIGH (ref 0.44–1.00)
GFR calc non Af Amer: 40 mL/min — ABNORMAL LOW (ref >60)
GFR, EST AFRICAN AMERICAN: 47 mL/min — AB (ref >60)
GLUCOSE: 79 mg/dL (ref 65–99)
Potassium: 4.4 mmol/L (ref 3.5–5.1)
Sodium: 129 mmol/L — ABNORMAL LOW (ref 135–145)

## 2017-07-07 LAB — OSMOLALITY: Osmolality: 288 mOsm/kg (ref 275–295)

## 2017-07-07 LAB — TROPONIN I: Troponin I: <0.03 ng/mL (ref <0.03)

## 2017-07-07 NOTE — Progress Notes (Signed)
Subjective:  She feels much better today and has had no recurrence of chest pain.  Talking about wanting to go home.  Objective:  Vital Signs in the last 24 hours: BP (!) 152/68   Pulse 79   Temp 98 F (36.7 C) (Oral)   Resp 18   Ht 5\' 1"  (1.549 m)   Wt 74.6 kg (164 lb 8 oz)   SpO2 98%   BMI 31.08 kg/m   Physical Exam: Elderly very talkative white female in no acute distress Lungs:  Clear Cardiac:  Regular rhythm, normal S1 and S2, no S3 Extremities:  No edema present  Intake/Output from previous day: 09/29 0701 - 09/30 0700 In: 1000 [IV Piggyback:1000] Out: -   Weight Filed Weights   07/06/17 1648 07/07/17 0435  Weight: 75.4 kg (166 lb 2 oz) 74.6 kg (164 lb 8 oz)    Lab Results: Basic Metabolic Panel:  Recent Labs  07/06/17 1032 07/07/17 0157  NA 124* 129*  K 4.4 4.4  CL 93* 99*  CO2 23 23  GLUCOSE 89 79  BUN 16 16  CREATININE 0.98 1.15*   CBC:  Recent Labs  07/06/17 1032  WBC 9.4  HGB 11.5*  HCT 35.3*  MCV 87.6  PLT 168   Cardiac Enzymes: Troponin (Point of Care Test)  Recent Labs  07/06/17 1036  TROPIPOC 0.01   Cardiac Panel (last 3 results)  Recent Labs  07/06/17 1445 07/06/17 1958 07/07/17 0157  TROPONINI <0.03 <0.03 <0.03    Telemetry: Sinus rhythm, personally reviewed  Assessment/Plan:  1.  Chest pain consistent with angina but with atypical features recent stenting-enzymes are negative 2.  Hyponatremia somewhat better today 3.  Hypertension still mildly up  Recommendations:  Continue to correct hyponatremia.  I will ambulate today and ask for cardiac rehabilitation to see.no need for additional cardiac workup at this time unless recurrent pain in house.    Kerry Hough  MD [facility] Cardiology  07/07/2017, 10:28 AM

## 2017-07-07 NOTE — Plan of Care (Signed)
Problem: Health Behavior/Discharge Planning: Goal: Ability to manage health-related needs will improve Outcome: Progressing Independent with ADL'S. States good family support at home.

## 2017-07-07 NOTE — Discharge Summary (Signed)
Physician Discharge Summary  Tonya Cruz:[id number] DOB: [birth year] DOA: 07/06/2017  PCP: Annamarie Major, MD  Admit date: 07/06/2017 Discharge date: 07/07/2017  Time spent: > 35 minutes  Recommendations for Outpatient Follow-up:  1. Monitor sodium levels 2. Ensure follow-up with cardiology   Discharge Diagnoses:  Principal Problem:   Acute hyponatremia Active Problems:   CAD (coronary artery disease), native coronary artery   Chest pain   GERD (gastroesophageal reflux disease)   High cholesterol   Hypothyroidism   Discharge Condition: Stable  Diet recommendation: Heart healthy  Filed Weights   07/06/17 1648 07/07/17 0435  Weight: 75.4 kg (166 lb 2 oz) 74.6 kg (164 lb 8 oz)    History of present illness:   83 y.o. female with medical history significant for CAD with recent cardiac stent on 9/3, low-grade chronic hyponatremia, GERD, dyslipidemia, depression and hypothyroidism. Also remote history of breast cancer. Patient presented to the ER primarily because of complaints of chest discomfort that was burning in [facility] Course:  Chest discomfort - Evaluated by cardiology who did not recommend any further cardiac workup. Troponins negative 3 - Chest discomfort atypical and I suspect most likely to GI related problem. She can have this worked up as an outpatient  Hyponatremia - Recommended increase in salt intake given that patient is on Celexa this will most likely make hyponatremia worse. I recommended patient follow-up with primary care physician to reassess within the next week as an outpatient. Last sodium levels 129  Otherwise for known medical conditions listed above continue prior to admission medication regimen listed below  Procedures:  None  Consultations:  Cardiology: Dr. Wynonia Lawman  Discharge Exam: Vitals:   07/07/17 1232 07/07/17 1335  BP: (!) 142/61   Pulse:    Resp:    Temp:  98.2 F (36.8 C)  SpO2:      General: Pt in nad,  alert and awake Cardiovascular: rrr, no rubs Respiratory: no increased wob, no wheezes  Discharge Instructions   Discharge Instructions    Call MD for:  difficulty breathing, headache or visual disturbances    Complete by:  As directed    Call MD for:  extreme fatigue    Complete by:  As directed    Call MD for:  temperature >100.4    Complete by:  As directed    Diet - low sodium heart healthy    Complete by:  As directed    Increase activity slowly    Complete by:  As directed      Current Discharge Medication List    CONTINUE these medications which have NOT CHANGED   Details  aspirin EC 81 MG tablet Take 81 mg by mouth daily.    atorvastatin (LIPITOR) 40 MG tablet Take 1 tablet (40 mg total) by mouth daily. Qty: 30 tablet, Refills: 6   Associated Diagnoses: Encounter for immunization    carvedilol (COREG) 6.25 MG tablet Take 1 tablet (6.25 mg total) by mouth 2 (two) times daily. Qty: 60 tablet, Refills: 6   Associated Diagnoses: Encounter for immunization    citalopram (CELEXA) 10 MG tablet Take 10 mg by mouth at bedtime. Refills: 0    denosumab (PROLIA) 60 MG/ML SOLN injection Inject 60 mg into the skin every 6 (six) months. Administer in upper arm, thigh, or abdomen    levothyroxine (SYNTHROID, LEVOTHROID) 100 MCG tablet Take 100 mcg by mouth every morning.  Refills: 0    pregabalin (LYRICA) 50 MG capsule Take 50 mg  by mouth 3 (three) times daily.    acetaminophen (TYLENOL) 325 MG tablet Take 650 mg by mouth every 4 (four) hours as needed for mild pain or moderate pain.   Associated Diagnoses: Encounter for immunization    amLODipine (NORVASC) 2.5 MG tablet Take 1 tablet (2.5 mg total) by mouth daily. Qty: 30 tablet, Refills: 3   Associated Diagnoses: Encounter for immunization    clopidogrel (PLAVIX) 75 MG tablet Take 1 tablet (75 mg total) by mouth daily with breakfast. Qty: 90 tablet, Refills: 3    hydrALAZINE (APRESOLINE) 100 MG tablet Take 100 mg by  mouth 3 (three) times daily.   Associated Diagnoses: Encounter for immunization    isosorbide mononitrate (IMDUR) 30 MG 24 hr tablet Take 1 tablet (30 mg total) by mouth daily. Qty: 30 tablet, Refills: 3   Associated Diagnoses: Encounter for immunization    losartan (COZAAR) 100 MG tablet Take 100 mg by mouth daily. Refills: 0    nitroGLYCERIN (NITROSTAT) 0.4 MG SL tablet Place 1 tablet (0.4 mg total) under the tongue every 5 (five) minutes x 3 doses as needed for chest pain. Qty: 25 tablet, Refills: 2    ranitidine (ZANTAC) 150 MG tablet Take 150 mg by mouth 2 (two) times daily. Refills: 0       Allergies  Allergen Reactions  . Ciprofloxacin Hcl   . Penicillins     Has patient had a PCN reaction causing immediate rash, facial/tongue/throat swelling, SOB or lightheadedness with hypotension: No Has patient had a PCN reaction causing severe rash involving mucus membranes or skin necrosis: No Has patient had a PCN reaction that required hospitalization: No Has patient had a PCN reaction occurring within the last 10 years: No If all of the above answers are "NO", then may proceed with Cephalosporin use.      The results of significant diagnostics from this hospitalization (including imaging, microbiology, ancillary and laboratory) are listed below for reference.    Significant Diagnostic Studies: Dg Chest 2 View  Result Date: 07/06/2017 CLINICAL DATA:  Patient complains of chest pains across both sides of her chest. And a burning sensation across the top of her chest. Symptoms come off and on since yesterday. No SOB. CHF. Some sinus drainage, no cough or congestion. EXAM: CHEST  2 VIEW COMPARISON:  06/08/2017 FINDINGS: Heart size is normal. The lungs are free of focal consolidations and pleural effusions. No pulmonary edema. Coarse calcification is again identified in medial aspect of the right breast overlying the right lung base. No suspicious pulmonary nodules. IMPRESSION: No  evidence for acute cardiopulmonary abnormality. Electronically Signed   By: Nolon Nations M.D.   On: 07/06/2017 11:17   Dg Chest 2 View  Result Date: 06/08/2017 CLINICAL DATA:  Chest pain and shortness of breath for 2 weeks EXAM: CHEST  2 VIEW COMPARISON:  Feb 23, 2014 FINDINGS: The heart size and mediastinal contours are within normal limits. There is no focal infiltrate, pulmonary edema, or pleural effusion. The visualized skeletal structures are stable. IMPRESSION: No active cardiopulmonary disease. Electronically Signed   By: Abelardo Diesel M.D.   On: 06/08/2017 17:50    Microbiology: No results found for this or any previous visit (from the past 240 hour(s)).   Labs: Basic Metabolic Panel:  Recent Labs Lab 07/06/17 1032 07/06/17 1445 07/07/17 0157  NA 124*  --  129*  K 4.4  --  4.4  CL 93*  --  99*  CO2 23  --  23  GLUCOSE  89  --  79  BUN 16  --  16  CREATININE 0.98  --  1.15*  CALCIUM 8.8*  --  8.3*  MG  --  1.9  --   PHOS  --  2.7  --    Liver Function Tests: No results for input(s): AST, ALT, ALKPHOS, BILITOT, PROT, ALBUMIN in the last 168 hours. No results for input(s): LIPASE, AMYLASE in the last 168 hours. No results for input(s): AMMONIA in the last 168 hours. CBC:  Recent Labs Lab 07/06/17 1032  WBC 9.4  HGB 11.5*  HCT 35.3*  MCV 87.6  PLT 168   Cardiac Enzymes:  Recent Labs Lab 07/06/17 1445 07/06/17 1958 07/07/17 0157  TROPONINI <0.03 <0.03 <0.03   BNP: BNP (last 3 results)  Recent Labs  06/08/17 1722 06/11/17 1234  BNP 262.7* 258.8*    ProBNP (last 3 results) No results for input(s): PROBNP in the last 8760 hours.  CBG: No results for input(s): GLUCAP in the last 168 hours.   Signed:  Velvet Bathe MD.  [facility] 07/07/2017, 4:45 PM

## 2017-07-08 LAB — T3: T3, Total: 70 ng/dL — ABNORMAL LOW (ref 71–180)

## 2017-07-12 DIAGNOSIS — I951 Orthostatic hypotension: Secondary | ICD-10-CM | POA: Diagnosis not present

## 2017-07-12 DIAGNOSIS — F411 Generalized anxiety disorder: Secondary | ICD-10-CM | POA: Diagnosis not present

## 2017-07-12 DIAGNOSIS — E034 Atrophy of thyroid (acquired): Secondary | ICD-10-CM | POA: Diagnosis not present

## 2017-07-12 DIAGNOSIS — E871 Hypo-osmolality and hyponatremia: Secondary | ICD-10-CM | POA: Diagnosis not present

## 2017-07-12 DIAGNOSIS — I10 Essential (primary) hypertension: Secondary | ICD-10-CM | POA: Diagnosis not present

## 2017-07-16 DIAGNOSIS — Z955 Presence of coronary angioplasty implant and graft: Secondary | ICD-10-CM | POA: Diagnosis not present

## 2017-07-16 DIAGNOSIS — E039 Hypothyroidism, unspecified: Secondary | ICD-10-CM | POA: Diagnosis not present

## 2017-07-16 DIAGNOSIS — Z79899 Other long term (current) drug therapy: Secondary | ICD-10-CM | POA: Diagnosis not present

## 2017-07-16 DIAGNOSIS — Z7982 Long term (current) use of aspirin: Secondary | ICD-10-CM | POA: Diagnosis not present

## 2017-07-16 DIAGNOSIS — E785 Hyperlipidemia, unspecified: Secondary | ICD-10-CM | POA: Diagnosis not present

## 2017-07-16 DIAGNOSIS — I2510 Atherosclerotic heart disease of native coronary artery without angina pectoris: Secondary | ICD-10-CM | POA: Diagnosis not present

## 2017-07-17 DIAGNOSIS — Z79899 Other long term (current) drug therapy: Secondary | ICD-10-CM | POA: Diagnosis not present

## 2017-07-17 DIAGNOSIS — E785 Hyperlipidemia, unspecified: Secondary | ICD-10-CM | POA: Diagnosis not present

## 2017-07-17 DIAGNOSIS — Z7982 Long term (current) use of aspirin: Secondary | ICD-10-CM | POA: Diagnosis not present

## 2017-07-17 DIAGNOSIS — I2510 Atherosclerotic heart disease of native coronary artery without angina pectoris: Secondary | ICD-10-CM | POA: Diagnosis not present

## 2017-07-17 DIAGNOSIS — Z955 Presence of coronary angioplasty implant and graft: Secondary | ICD-10-CM | POA: Diagnosis not present

## 2017-07-17 DIAGNOSIS — E039 Hypothyroidism, unspecified: Secondary | ICD-10-CM | POA: Diagnosis not present

## 2017-07-18 DIAGNOSIS — R799 Abnormal finding of blood chemistry, unspecified: Secondary | ICD-10-CM | POA: Diagnosis not present

## 2017-07-18 DIAGNOSIS — I10 Essential (primary) hypertension: Secondary | ICD-10-CM | POA: Diagnosis not present

## 2017-07-18 DIAGNOSIS — E871 Hypo-osmolality and hyponatremia: Secondary | ICD-10-CM | POA: Diagnosis not present

## 2017-07-19 DIAGNOSIS — Z79899 Other long term (current) drug therapy: Secondary | ICD-10-CM | POA: Diagnosis not present

## 2017-07-19 DIAGNOSIS — E039 Hypothyroidism, unspecified: Secondary | ICD-10-CM | POA: Diagnosis not present

## 2017-07-19 DIAGNOSIS — E785 Hyperlipidemia, unspecified: Secondary | ICD-10-CM | POA: Diagnosis not present

## 2017-07-19 DIAGNOSIS — Z7982 Long term (current) use of aspirin: Secondary | ICD-10-CM | POA: Diagnosis not present

## 2017-07-19 DIAGNOSIS — Z955 Presence of coronary angioplasty implant and graft: Secondary | ICD-10-CM | POA: Diagnosis not present

## 2017-07-19 DIAGNOSIS — I2510 Atherosclerotic heart disease of native coronary artery without angina pectoris: Secondary | ICD-10-CM | POA: Diagnosis not present

## 2017-07-22 ENCOUNTER — Ambulatory Visit (INDEPENDENT_AMBULATORY_CARE_PROVIDER_SITE_OTHER): Payer: Medicare Other | Admitting: Nurse Practitioner

## 2017-07-22 ENCOUNTER — Encounter: Payer: Self-pay | Admitting: Nurse Practitioner

## 2017-07-22 VITALS — BP 116/58 | HR 60 | Ht 65.0 in | Wt 162.4 lb

## 2017-07-22 DIAGNOSIS — I214 Non-ST elevation (NSTEMI) myocardial infarction: Secondary | ICD-10-CM

## 2017-07-22 DIAGNOSIS — E7849 Other hyperlipidemia: Secondary | ICD-10-CM

## 2017-07-22 DIAGNOSIS — I2510 Atherosclerotic heart disease of native coronary artery without angina pectoris: Secondary | ICD-10-CM | POA: Diagnosis not present

## 2017-07-22 DIAGNOSIS — I10 Essential (primary) hypertension: Secondary | ICD-10-CM | POA: Diagnosis not present

## 2017-07-22 NOTE — Progress Notes (Signed)
CARDIOLOGY OFFICE NOTE  Date:  07/22/2017    Tonya Cruz Date of Birth: 06-11-1924 Medical Record #[id number]  PCP:  Annamarie Major, MD  Cardiologist:  Tamala Julian   Chief Complaint  Patient presents with  . Coronary Artery Disease    1 month check - seen for Dr. Tamala Julian    History of Present Illness: Tonya Cruz is a 83 y.o. female who presents today for a follow up visit. Seen for Dr. Tamala Julian.   She has a history of HTN, HLD and CAD.   Admitted from 06/08/17 to 06/12/17 for NSTEMI.   BP systolic in the 371I, she had ST depression in lateral leads on EKG.  Troponin +. She under went cardiac cath with noted chronic occlusion mid RCA with filling of the distal RCA by left-to-right collaterals, and 99% proximal/mid LAD lesion which was successfully treated with PTCA/DES 1. Plan for DAPTwith aspirin and Plavix for at least 1 year.   Seen a month ago - BP sky high. Still having chest pain. Easily fatigued. Family noted to have concerns about Lipitor 80 mg and if Brilinta would be better with Plavix. It was elected to continue with Plavix.  Imdur was added.   She did end up being readmitted. Has been taken off Lexapro.   Comes in today. Here with her daughter. She says she is "taking it one day at a time". She is back home - pretty much has around the clock care now. She will be staying by herself Wednesday night for the first. She did go to cardiac rehab twice last week. Noted that her biggest issue is "anxiety". She was on Ativan in the past - she was given this back while admitted and while at [facility]. Her daughter is monitoring this due to concern for fall. Now on Welbutrin. TSH seems to be climbing - does not look like her dose has been adjusted. Daughter works at [employer] office and will discuss tomorrow. Less chest pain noted - sounds more fleeting. She is doing rehab and seems to be progressing. Hydralazine has been cut back to 50 mg TID. BP's have been ok on this dose.    Past Medical History:  Diagnosis Date  . Anxiety   . Breast cancer ([place])    right side  . Coronary artery disease    06/11/17 PTCA/DES x1 to mLAD, CTO mRCA with col.   [name] GERD (gastroesophageal reflux disease)   . High cholesterol   . Hypertension   . NSTEMI (non-ST elevated myocardial infarction) ([place]) 06/11/2017  . Thyroid disease     Past Surgical History:  Procedure Laterality Date  . BREAST SURGERY    . CARDIAC CATHETERIZATION  06/11/2017  . CORONARY STENT INTERVENTION N/A 06/11/2017   Procedure: CORONARY STENT INTERVENTION;  Surgeon: Burnell Blanks, MD;  Location: [facility];  Service: Cardiovascular;  Laterality: N/A;  . LEFT HEART CATH AND CORONARY ANGIOGRAPHY N/A 06/11/2017   Procedure: LEFT HEART CATH AND CORONARY ANGIOGRAPHY;  Surgeon: Burnell Blanks, MD;  Location: [facility];  Service: Cardiovascular;  Laterality: N/A;  . roto cuff       Medications: Current Meds  Medication Sig  . acetaminophen (TYLENOL) 325 MG tablet Take 650 mg by mouth every 4 (four) hours as needed for mild pain or moderate pain.  [name] amLODipine (NORVASC) 2.5 MG tablet Take 1 tablet (2.5 mg total) by mouth daily.  [name] aspirin EC 81 MG tablet Take 81 mg  by mouth daily.  [name] atorvastatin (LIPITOR) 40 MG tablet Take 1 tablet (40 mg total) by mouth daily.  [name] buPROPion (WELLBUTRIN) 75 MG tablet Take 75 mg by mouth at bedtime.  . carvedilol (COREG) 6.25 MG tablet Take 1 tablet (6.25 mg total) by mouth 2 (two) times daily.  . clopidogrel (PLAVIX) 75 MG tablet Take 1 tablet (75 mg total) by mouth daily with breakfast.  . denosumab (PROLIA) 60 MG/ML SOLN injection Inject 60 mg into the skin every 6 (six) months. Administer in upper arm, thigh, or abdomen  . hydrALAZINE (APRESOLINE) 25 MG tablet Take 50 mg by mouth 3 (three) times daily.   . isosorbide mononitrate (IMDUR) 30 MG 24 hr tablet Take 1 tablet (30 mg total) by mouth daily.  [name] levothyroxine (SYNTHROID, LEVOTHROID) 100  MCG tablet Take 100 mcg by mouth every morning.   [name] losartan (COZAAR) 100 MG tablet Take 100 mg by mouth daily.  . nitroGLYCERIN (NITROSTAT) 0.4 MG SL tablet Place 1 tablet (0.4 mg total) under the tongue every 5 (five) minutes x 3 doses as needed for chest pain.  . pregabalin (LYRICA) 50 MG capsule Take 50 mg by mouth 3 (three) times daily.  . ranitidine (ZANTAC) 150 MG tablet Take 150 mg by mouth 2 (two) times daily.  . [DISCONTINUED] citalopram (CELEXA) 10 MG tablet Take 10 mg by mouth at bedtime.  . [DISCONTINUED] hydrALAZINE (APRESOLINE) 100 MG tablet Take 100 mg by mouth 3 (three) times daily.     Allergies: Allergies  Allergen Reactions  . Celexa [Citalopram Hydrobromide] Other (See Comments)    Low sodium  . Ciprofloxacin Hcl   . Penicillins     Has patient had a PCN reaction causing immediate rash, facial/tongue/throat swelling, SOB or lightheadedness with hypotension: No Has patient had a PCN reaction causing severe rash involving mucus membranes or skin necrosis: No Has patient had a PCN reaction that required hospitalization: No Has patient had a PCN reaction occurring within the last 10 years: No If all of the above answers are "NO", then may proceed with Cephalosporin use.    Social History: The patient  reports that she has quit smoking. She has never used smokeless tobacco. She reports that she does not drink alcohol or use drugs.   Family History: The patient's family history includes Breast cancer in her mother; Diabetes in her mother; Other in her father.   Review of Systems: Please see the history of present illness.   Otherwise, the review of systems is positive for none.   All other systems are reviewed and negative.   Physical Exam: VS:  BP (!) 116/58 (BP Location: Left Arm, Patient Position: Sitting, Cuff Size: Normal)   Pulse 60   Ht 5\' 5"  (1.651 m)   Wt 162 lb 6.4 oz (73.7 kg)   SpO2 97% Comment: at rest  BMI 27.02 kg/m  .  BMI Body mass index is  27.02 kg/m.  Wt Readings from Last 3 Encounters:  07/22/17 162 lb 6.4 oz (73.7 kg)  07/07/17 164 lb 8 oz (74.6 kg)  06/28/17 168 lb (76.2 kg)    General: Pleasant. Elderly female. Alert and in no acute distress.   HEENT: Normal.  Neck: Supple, no JVD, carotid bruits, or masses noted.  Cardiac: Regular rate and rhythm. No murmurs, rubs, or gallops. No edema.  Respiratory:  Lungs are clear to auscultation bilaterally with normal work of breathing.  GI: Soft and nontender.  MS: No deformity or atrophy. Gait and  ROM intact.  Skin: Warm and dry. Color is normal.  Neuro:  Strength and sensation are intact and no gross focal deficits noted.  Psych: Alert, appropriate and with normal affect.   LABORATORY DATA:  EKG:  EKG is not ordered today.  Lab Results  Component Value Date   WBC 9.4 07/06/2017   HGB 11.5 (L) 07/06/2017   HCT 35.3 (L) 07/06/2017   PLT 168 07/06/2017   GLUCOSE 79 07/07/2017   CHOL 112 06/09/2017   TRIG 99 06/09/2017   HDL 40 (L) 06/09/2017   LDLCALC 52 06/09/2017   NA 129 (L) 07/07/2017   K 4.4 07/07/2017   CL 99 (L) 07/07/2017   CREATININE 1.15 (H) 07/07/2017   BUN 16 07/07/2017   CO2 23 07/07/2017   TSH 13.056 (H) 07/06/2017   INR 1.04 06/08/2017   HGBA1C 4.9 06/09/2017       BNP (last 3 results)  Recent Labs  06/08/17 1722 06/11/17 1234  BNP 262.7* 258.8*    ProBNP (last 3 results) No results for input(s): PROBNP in the last 8760 hours.   Other Studies Reviewed Today:  Echo 06/09/17 Study Conclusions  - Left ventricle: The cavity size was normal. Wall thickness was normal. Systolic function was normal. The estimated ejection fraction was in the range of 60% to 65%. Wall motion was normal; there were no regional wall motion abnormalities. Doppler parameters are consistent with pseudonormal left ventricular relaxation (grade 2 diastolic dysfunction). The E/A ratio is >1.5. The E/e&' ratio is >15, suggesting elevated LV  filling pressure. - Aortic valve: Trileaflet. Sclerosis without stenosis. There was mild regurgitation. - Mitral valve: Mildly thickened leaflets . There was mild regurgitation. - Left atrium: The atrium was mildly dilated. - Tricuspid valve: There was mild regurgitation. - Pulmonary arteries: PA peak pressure: 52 mm Hg (S). - Inferior vena cava: The vessel was normal in size. The respirophasic diameter changes were in the normal range (>= 50%), consistent with normal central venous pressure.  Impressions:  - LVEF 60-65%, normal wall thickness, normal wall motion, grade 2 DD with elevated LV filling pressure, aortic valve sclerosis with mild AI, mild MR, mild LAE, mild TR, RVSP 52 mmHg, normal IVC.   Cardiac cath 06/11/17 CORONARY STENT INTERVENTION  LEFT HEART CATH AND CORONARY ANGIOGRAPHY  Conclusion     Mid RCA lesion, 100 %stenosed.  A STENT SYNERGY DES 3X16 drug eluting stent was successfully placed.  Prox LAD to Mid LAD lesion, 99 %stenosed.  Post intervention, there is a 0% residual stenosis.  Mid LAD lesion, 30 %stenosed.  Dist LAD lesion, 40 %stenosed.  1. NSTEMI secondary to severe stenosis in the mid LAD 2. Chronic occlusion mid RCA with filling of distal RCA by brisk left to right collaterals.  3. Successful PTCA/DES x 1 mid LAD  Recommendations: Will continue DAPT with ASA and Plavix for at least one year. Continue beta blocker and statin.      ASSESSMENT AND PLAN:  1.  NSTEMI with PCI with DES Synergy to pLAD to mLAD - she is on DAPT - her chest pain has improved with addition of Imdur. No change with current regimen.     2.  CAD with chronic occlusion of RCA.  On statin/DAPT and nitrate.    4.  HTN - BP looks to be improving. No changes made today.   5.  HLD - on statin therapy. Will need lab on return.   6.  Fatigue - this seems to have improved -  now back home.   7. Anxiety - to defer to PCP  8. Hypothyroidism -  probably needs her dose adjusted - daughter to discuss with PCP tomorrow.    Current medicines are reviewed with the patient today.  The patient does not have concerns regarding medicines other than what has been noted above.  The following changes have been made:  See above.  Labs/ tests ordered today include:   No orders of the defined types were placed in this encounter.    Disposition:   FU with Dr. Tamala Julian in 2 months.   Patient is agreeable to this plan and will call if any problems develop in the interim.   SignedTruitt Merle, NP  07/22/2017 11:42 AM  [facility] [address] [place], [place]  [address] Phone: [phone] Fax: [phone]

## 2017-07-22 NOTE — Patient Instructions (Signed)
We will be checking the following labs today - NONE   Medication Instructions:    Continue with your current medicines.     Testing/Procedures To Be Arranged:  N/A  Follow-Up:   See Dr. Tamala Julian in 6 to 8 weeks.     Other Special Instructions:   Talk to PCP about possible increase in your thyroid medicine    If you need a refill on your cardiac medications before your next appointment, please call your pharmacy.   Call the [place] office at [phone] if you have any questions, problems or concerns.

## 2017-07-26 DIAGNOSIS — I2510 Atherosclerotic heart disease of native coronary artery without angina pectoris: Secondary | ICD-10-CM | POA: Diagnosis not present

## 2017-07-26 DIAGNOSIS — Z955 Presence of coronary angioplasty implant and graft: Secondary | ICD-10-CM | POA: Diagnosis not present

## 2017-07-26 DIAGNOSIS — Z79899 Other long term (current) drug therapy: Secondary | ICD-10-CM | POA: Diagnosis not present

## 2017-07-26 DIAGNOSIS — Z7982 Long term (current) use of aspirin: Secondary | ICD-10-CM | POA: Diagnosis not present

## 2017-07-26 DIAGNOSIS — E785 Hyperlipidemia, unspecified: Secondary | ICD-10-CM | POA: Diagnosis not present

## 2017-07-26 DIAGNOSIS — E039 Hypothyroidism, unspecified: Secondary | ICD-10-CM | POA: Diagnosis not present

## 2017-07-28 DIAGNOSIS — N3090 Cystitis, unspecified without hematuria: Secondary | ICD-10-CM | POA: Diagnosis not present

## 2017-07-28 DIAGNOSIS — R300 Dysuria: Secondary | ICD-10-CM | POA: Diagnosis not present

## 2017-07-29 DIAGNOSIS — Z79899 Other long term (current) drug therapy: Secondary | ICD-10-CM | POA: Diagnosis not present

## 2017-07-29 DIAGNOSIS — E039 Hypothyroidism, unspecified: Secondary | ICD-10-CM | POA: Diagnosis not present

## 2017-07-29 DIAGNOSIS — Z7982 Long term (current) use of aspirin: Secondary | ICD-10-CM | POA: Diagnosis not present

## 2017-07-29 DIAGNOSIS — I2510 Atherosclerotic heart disease of native coronary artery without angina pectoris: Secondary | ICD-10-CM | POA: Diagnosis not present

## 2017-07-29 DIAGNOSIS — E785 Hyperlipidemia, unspecified: Secondary | ICD-10-CM | POA: Diagnosis not present

## 2017-07-29 DIAGNOSIS — Z955 Presence of coronary angioplasty implant and graft: Secondary | ICD-10-CM | POA: Diagnosis not present

## 2017-08-02 DIAGNOSIS — Z79899 Other long term (current) drug therapy: Secondary | ICD-10-CM | POA: Diagnosis not present

## 2017-08-02 DIAGNOSIS — Z7982 Long term (current) use of aspirin: Secondary | ICD-10-CM | POA: Diagnosis not present

## 2017-08-02 DIAGNOSIS — Z955 Presence of coronary angioplasty implant and graft: Secondary | ICD-10-CM | POA: Diagnosis not present

## 2017-08-02 DIAGNOSIS — E785 Hyperlipidemia, unspecified: Secondary | ICD-10-CM | POA: Diagnosis not present

## 2017-08-02 DIAGNOSIS — I2510 Atherosclerotic heart disease of native coronary artery without angina pectoris: Secondary | ICD-10-CM | POA: Diagnosis not present

## 2017-08-02 DIAGNOSIS — E039 Hypothyroidism, unspecified: Secondary | ICD-10-CM | POA: Diagnosis not present

## 2017-08-07 DIAGNOSIS — E785 Hyperlipidemia, unspecified: Secondary | ICD-10-CM | POA: Diagnosis not present

## 2017-08-07 DIAGNOSIS — Z79899 Other long term (current) drug therapy: Secondary | ICD-10-CM | POA: Diagnosis not present

## 2017-08-07 DIAGNOSIS — Z7982 Long term (current) use of aspirin: Secondary | ICD-10-CM | POA: Diagnosis not present

## 2017-08-07 DIAGNOSIS — I2510 Atherosclerotic heart disease of native coronary artery without angina pectoris: Secondary | ICD-10-CM | POA: Diagnosis not present

## 2017-08-07 DIAGNOSIS — Z955 Presence of coronary angioplasty implant and graft: Secondary | ICD-10-CM | POA: Diagnosis not present

## 2017-08-07 DIAGNOSIS — E039 Hypothyroidism, unspecified: Secondary | ICD-10-CM | POA: Diagnosis not present

## 2017-08-09 DIAGNOSIS — E785 Hyperlipidemia, unspecified: Secondary | ICD-10-CM | POA: Diagnosis not present

## 2017-08-09 DIAGNOSIS — Z79899 Other long term (current) drug therapy: Secondary | ICD-10-CM | POA: Diagnosis not present

## 2017-08-09 DIAGNOSIS — I2510 Atherosclerotic heart disease of native coronary artery without angina pectoris: Secondary | ICD-10-CM | POA: Diagnosis not present

## 2017-08-09 DIAGNOSIS — Z7982 Long term (current) use of aspirin: Secondary | ICD-10-CM | POA: Diagnosis not present

## 2017-08-09 DIAGNOSIS — Z955 Presence of coronary angioplasty implant and graft: Secondary | ICD-10-CM | POA: Diagnosis not present

## 2017-08-09 DIAGNOSIS — E039 Hypothyroidism, unspecified: Secondary | ICD-10-CM | POA: Diagnosis not present

## 2017-08-12 DIAGNOSIS — E039 Hypothyroidism, unspecified: Secondary | ICD-10-CM | POA: Diagnosis not present

## 2017-08-12 DIAGNOSIS — Z79899 Other long term (current) drug therapy: Secondary | ICD-10-CM | POA: Diagnosis not present

## 2017-08-12 DIAGNOSIS — E785 Hyperlipidemia, unspecified: Secondary | ICD-10-CM | POA: Diagnosis not present

## 2017-08-12 DIAGNOSIS — I2510 Atherosclerotic heart disease of native coronary artery without angina pectoris: Secondary | ICD-10-CM | POA: Diagnosis not present

## 2017-08-12 DIAGNOSIS — Z955 Presence of coronary angioplasty implant and graft: Secondary | ICD-10-CM | POA: Diagnosis not present

## 2017-08-12 DIAGNOSIS — Z7982 Long term (current) use of aspirin: Secondary | ICD-10-CM | POA: Diagnosis not present

## 2017-08-13 DIAGNOSIS — M79604 Pain in right leg: Secondary | ICD-10-CM | POA: Diagnosis not present

## 2017-08-19 DIAGNOSIS — Z79899 Other long term (current) drug therapy: Secondary | ICD-10-CM | POA: Diagnosis not present

## 2017-08-19 DIAGNOSIS — E785 Hyperlipidemia, unspecified: Secondary | ICD-10-CM | POA: Diagnosis not present

## 2017-08-19 DIAGNOSIS — Z7982 Long term (current) use of aspirin: Secondary | ICD-10-CM | POA: Diagnosis not present

## 2017-08-19 DIAGNOSIS — Z955 Presence of coronary angioplasty implant and graft: Secondary | ICD-10-CM | POA: Diagnosis not present

## 2017-08-19 DIAGNOSIS — I2510 Atherosclerotic heart disease of native coronary artery without angina pectoris: Secondary | ICD-10-CM | POA: Diagnosis not present

## 2017-08-19 DIAGNOSIS — E039 Hypothyroidism, unspecified: Secondary | ICD-10-CM | POA: Diagnosis not present

## 2017-08-21 DIAGNOSIS — Z7982 Long term (current) use of aspirin: Secondary | ICD-10-CM | POA: Diagnosis not present

## 2017-08-21 DIAGNOSIS — E785 Hyperlipidemia, unspecified: Secondary | ICD-10-CM | POA: Diagnosis not present

## 2017-08-21 DIAGNOSIS — E039 Hypothyroidism, unspecified: Secondary | ICD-10-CM | POA: Diagnosis not present

## 2017-08-21 DIAGNOSIS — Z79899 Other long term (current) drug therapy: Secondary | ICD-10-CM | POA: Diagnosis not present

## 2017-08-21 DIAGNOSIS — I2510 Atherosclerotic heart disease of native coronary artery without angina pectoris: Secondary | ICD-10-CM | POA: Diagnosis not present

## 2017-08-21 DIAGNOSIS — Z955 Presence of coronary angioplasty implant and graft: Secondary | ICD-10-CM | POA: Diagnosis not present

## 2017-08-23 DIAGNOSIS — E039 Hypothyroidism, unspecified: Secondary | ICD-10-CM | POA: Diagnosis not present

## 2017-08-23 DIAGNOSIS — I2510 Atherosclerotic heart disease of native coronary artery without angina pectoris: Secondary | ICD-10-CM | POA: Diagnosis not present

## 2017-08-23 DIAGNOSIS — Z79899 Other long term (current) drug therapy: Secondary | ICD-10-CM | POA: Diagnosis not present

## 2017-08-23 DIAGNOSIS — E785 Hyperlipidemia, unspecified: Secondary | ICD-10-CM | POA: Diagnosis not present

## 2017-08-23 DIAGNOSIS — Z955 Presence of coronary angioplasty implant and graft: Secondary | ICD-10-CM | POA: Diagnosis not present

## 2017-08-23 DIAGNOSIS — Z7982 Long term (current) use of aspirin: Secondary | ICD-10-CM | POA: Diagnosis not present

## 2017-08-28 DIAGNOSIS — E785 Hyperlipidemia, unspecified: Secondary | ICD-10-CM | POA: Diagnosis not present

## 2017-08-28 DIAGNOSIS — E039 Hypothyroidism, unspecified: Secondary | ICD-10-CM | POA: Diagnosis not present

## 2017-08-28 DIAGNOSIS — Z7982 Long term (current) use of aspirin: Secondary | ICD-10-CM | POA: Diagnosis not present

## 2017-08-28 DIAGNOSIS — Z955 Presence of coronary angioplasty implant and graft: Secondary | ICD-10-CM | POA: Diagnosis not present

## 2017-08-28 DIAGNOSIS — I2510 Atherosclerotic heart disease of native coronary artery without angina pectoris: Secondary | ICD-10-CM | POA: Diagnosis not present

## 2017-08-28 DIAGNOSIS — Z79899 Other long term (current) drug therapy: Secondary | ICD-10-CM | POA: Diagnosis not present

## 2017-09-02 DIAGNOSIS — E785 Hyperlipidemia, unspecified: Secondary | ICD-10-CM | POA: Diagnosis not present

## 2017-09-02 DIAGNOSIS — Z79899 Other long term (current) drug therapy: Secondary | ICD-10-CM | POA: Diagnosis not present

## 2017-09-02 DIAGNOSIS — I2510 Atherosclerotic heart disease of native coronary artery without angina pectoris: Secondary | ICD-10-CM | POA: Diagnosis not present

## 2017-09-02 DIAGNOSIS — E039 Hypothyroidism, unspecified: Secondary | ICD-10-CM | POA: Diagnosis not present

## 2017-09-02 DIAGNOSIS — Z955 Presence of coronary angioplasty implant and graft: Secondary | ICD-10-CM | POA: Diagnosis not present

## 2017-09-02 DIAGNOSIS — Z7982 Long term (current) use of aspirin: Secondary | ICD-10-CM | POA: Diagnosis not present

## 2017-09-06 DIAGNOSIS — E785 Hyperlipidemia, unspecified: Secondary | ICD-10-CM | POA: Diagnosis not present

## 2017-09-06 DIAGNOSIS — Z955 Presence of coronary angioplasty implant and graft: Secondary | ICD-10-CM | POA: Diagnosis not present

## 2017-09-06 DIAGNOSIS — I2510 Atherosclerotic heart disease of native coronary artery without angina pectoris: Secondary | ICD-10-CM | POA: Diagnosis not present

## 2017-09-06 DIAGNOSIS — Z7982 Long term (current) use of aspirin: Secondary | ICD-10-CM | POA: Diagnosis not present

## 2017-09-06 DIAGNOSIS — Z79899 Other long term (current) drug therapy: Secondary | ICD-10-CM | POA: Diagnosis not present

## 2017-09-06 DIAGNOSIS — E039 Hypothyroidism, unspecified: Secondary | ICD-10-CM | POA: Diagnosis not present

## 2017-09-09 DIAGNOSIS — Z79899 Other long term (current) drug therapy: Secondary | ICD-10-CM | POA: Diagnosis not present

## 2017-09-09 DIAGNOSIS — Z955 Presence of coronary angioplasty implant and graft: Secondary | ICD-10-CM | POA: Diagnosis not present

## 2017-09-09 DIAGNOSIS — Z7982 Long term (current) use of aspirin: Secondary | ICD-10-CM | POA: Diagnosis not present

## 2017-09-09 DIAGNOSIS — Z954 Presence of other heart-valve replacement: Secondary | ICD-10-CM | POA: Diagnosis not present

## 2017-09-09 DIAGNOSIS — I2510 Atherosclerotic heart disease of native coronary artery without angina pectoris: Secondary | ICD-10-CM | POA: Diagnosis not present

## 2017-09-09 DIAGNOSIS — E785 Hyperlipidemia, unspecified: Secondary | ICD-10-CM | POA: Diagnosis not present

## 2017-09-09 DIAGNOSIS — E039 Hypothyroidism, unspecified: Secondary | ICD-10-CM | POA: Diagnosis not present

## 2017-09-10 DIAGNOSIS — Z955 Presence of coronary angioplasty implant and graft: Secondary | ICD-10-CM | POA: Diagnosis not present

## 2017-09-10 DIAGNOSIS — F411 Generalized anxiety disorder: Secondary | ICD-10-CM | POA: Diagnosis not present

## 2017-09-10 DIAGNOSIS — R55 Syncope and collapse: Secondary | ICD-10-CM | POA: Diagnosis not present

## 2017-09-10 DIAGNOSIS — Z87891 Personal history of nicotine dependence: Secondary | ICD-10-CM | POA: Diagnosis not present

## 2017-09-10 DIAGNOSIS — R918 Other nonspecific abnormal finding of lung field: Secondary | ICD-10-CM | POA: Diagnosis not present

## 2017-09-10 DIAGNOSIS — I2510 Atherosclerotic heart disease of native coronary artery without angina pectoris: Secondary | ICD-10-CM | POA: Diagnosis not present

## 2017-09-10 DIAGNOSIS — I252 Old myocardial infarction: Secondary | ICD-10-CM | POA: Diagnosis not present

## 2017-09-10 DIAGNOSIS — R072 Precordial pain: Secondary | ICD-10-CM | POA: Diagnosis not present

## 2017-09-10 DIAGNOSIS — E890 Postprocedural hypothyroidism: Secondary | ICD-10-CM | POA: Diagnosis not present

## 2017-09-10 DIAGNOSIS — I10 Essential (primary) hypertension: Secondary | ICD-10-CM | POA: Diagnosis not present

## 2017-09-10 DIAGNOSIS — R404 Transient alteration of awareness: Secondary | ICD-10-CM | POA: Diagnosis not present

## 2017-09-20 ENCOUNTER — Ambulatory Visit: Payer: Medicare Other | Admitting: Interventional Cardiology

## 2017-09-20 DIAGNOSIS — I2510 Atherosclerotic heart disease of native coronary artery without angina pectoris: Secondary | ICD-10-CM | POA: Diagnosis not present

## 2017-09-20 DIAGNOSIS — Z954 Presence of other heart-valve replacement: Secondary | ICD-10-CM | POA: Diagnosis not present

## 2017-09-20 DIAGNOSIS — E039 Hypothyroidism, unspecified: Secondary | ICD-10-CM | POA: Diagnosis not present

## 2017-09-20 DIAGNOSIS — Z955 Presence of coronary angioplasty implant and graft: Secondary | ICD-10-CM | POA: Diagnosis not present

## 2017-09-20 DIAGNOSIS — Z79899 Other long term (current) drug therapy: Secondary | ICD-10-CM | POA: Diagnosis not present

## 2017-09-20 DIAGNOSIS — E785 Hyperlipidemia, unspecified: Secondary | ICD-10-CM | POA: Diagnosis not present

## 2017-09-23 DIAGNOSIS — Z79899 Other long term (current) drug therapy: Secondary | ICD-10-CM | POA: Diagnosis not present

## 2017-09-23 DIAGNOSIS — Z955 Presence of coronary angioplasty implant and graft: Secondary | ICD-10-CM | POA: Diagnosis not present

## 2017-09-23 DIAGNOSIS — I2510 Atherosclerotic heart disease of native coronary artery without angina pectoris: Secondary | ICD-10-CM | POA: Diagnosis not present

## 2017-09-23 DIAGNOSIS — E785 Hyperlipidemia, unspecified: Secondary | ICD-10-CM | POA: Diagnosis not present

## 2017-09-23 DIAGNOSIS — E039 Hypothyroidism, unspecified: Secondary | ICD-10-CM | POA: Diagnosis not present

## 2017-09-23 DIAGNOSIS — Z954 Presence of other heart-valve replacement: Secondary | ICD-10-CM | POA: Diagnosis not present

## 2017-09-27 DIAGNOSIS — Z954 Presence of other heart-valve replacement: Secondary | ICD-10-CM | POA: Diagnosis not present

## 2017-09-27 DIAGNOSIS — Z955 Presence of coronary angioplasty implant and graft: Secondary | ICD-10-CM | POA: Diagnosis not present

## 2017-09-27 DIAGNOSIS — E785 Hyperlipidemia, unspecified: Secondary | ICD-10-CM | POA: Diagnosis not present

## 2017-09-27 DIAGNOSIS — E039 Hypothyroidism, unspecified: Secondary | ICD-10-CM | POA: Diagnosis not present

## 2017-09-27 DIAGNOSIS — Z79899 Other long term (current) drug therapy: Secondary | ICD-10-CM | POA: Diagnosis not present

## 2017-09-27 DIAGNOSIS — I2510 Atherosclerotic heart disease of native coronary artery without angina pectoris: Secondary | ICD-10-CM | POA: Diagnosis not present

## 2017-10-02 DIAGNOSIS — E039 Hypothyroidism, unspecified: Secondary | ICD-10-CM | POA: Diagnosis not present

## 2017-10-02 DIAGNOSIS — I2510 Atherosclerotic heart disease of native coronary artery without angina pectoris: Secondary | ICD-10-CM | POA: Diagnosis not present

## 2017-10-02 DIAGNOSIS — Z954 Presence of other heart-valve replacement: Secondary | ICD-10-CM | POA: Diagnosis not present

## 2017-10-02 DIAGNOSIS — Z79899 Other long term (current) drug therapy: Secondary | ICD-10-CM | POA: Diagnosis not present

## 2017-10-02 DIAGNOSIS — Z955 Presence of coronary angioplasty implant and graft: Secondary | ICD-10-CM | POA: Diagnosis not present

## 2017-10-02 DIAGNOSIS — E785 Hyperlipidemia, unspecified: Secondary | ICD-10-CM | POA: Diagnosis not present

## 2017-10-04 DIAGNOSIS — I2510 Atherosclerotic heart disease of native coronary artery without angina pectoris: Secondary | ICD-10-CM | POA: Diagnosis not present

## 2017-10-04 DIAGNOSIS — E785 Hyperlipidemia, unspecified: Secondary | ICD-10-CM | POA: Diagnosis not present

## 2017-10-04 DIAGNOSIS — Z955 Presence of coronary angioplasty implant and graft: Secondary | ICD-10-CM | POA: Diagnosis not present

## 2017-10-04 DIAGNOSIS — E039 Hypothyroidism, unspecified: Secondary | ICD-10-CM | POA: Diagnosis not present

## 2017-10-04 DIAGNOSIS — Z954 Presence of other heart-valve replacement: Secondary | ICD-10-CM | POA: Diagnosis not present

## 2017-10-04 DIAGNOSIS — Z79899 Other long term (current) drug therapy: Secondary | ICD-10-CM | POA: Diagnosis not present

## 2017-10-09 DIAGNOSIS — Z7982 Long term (current) use of aspirin: Secondary | ICD-10-CM | POA: Diagnosis not present

## 2017-10-09 DIAGNOSIS — E039 Hypothyroidism, unspecified: Secondary | ICD-10-CM | POA: Diagnosis not present

## 2017-10-09 DIAGNOSIS — I2510 Atherosclerotic heart disease of native coronary artery without angina pectoris: Secondary | ICD-10-CM | POA: Diagnosis not present

## 2017-10-09 DIAGNOSIS — Z954 Presence of other heart-valve replacement: Secondary | ICD-10-CM | POA: Diagnosis not present

## 2017-10-09 DIAGNOSIS — Z79899 Other long term (current) drug therapy: Secondary | ICD-10-CM | POA: Diagnosis not present

## 2017-10-09 DIAGNOSIS — E785 Hyperlipidemia, unspecified: Secondary | ICD-10-CM | POA: Diagnosis not present

## 2017-10-09 DIAGNOSIS — Z955 Presence of coronary angioplasty implant and graft: Secondary | ICD-10-CM | POA: Diagnosis not present

## 2017-10-11 DIAGNOSIS — Z954 Presence of other heart-valve replacement: Secondary | ICD-10-CM | POA: Diagnosis not present

## 2017-10-11 DIAGNOSIS — Z79899 Other long term (current) drug therapy: Secondary | ICD-10-CM | POA: Diagnosis not present

## 2017-10-11 DIAGNOSIS — I2510 Atherosclerotic heart disease of native coronary artery without angina pectoris: Secondary | ICD-10-CM | POA: Diagnosis not present

## 2017-10-11 DIAGNOSIS — E785 Hyperlipidemia, unspecified: Secondary | ICD-10-CM | POA: Diagnosis not present

## 2017-10-11 DIAGNOSIS — E039 Hypothyroidism, unspecified: Secondary | ICD-10-CM | POA: Diagnosis not present

## 2017-10-11 DIAGNOSIS — Z955 Presence of coronary angioplasty implant and graft: Secondary | ICD-10-CM | POA: Diagnosis not present

## 2017-10-14 DIAGNOSIS — I2510 Atherosclerotic heart disease of native coronary artery without angina pectoris: Secondary | ICD-10-CM | POA: Diagnosis not present

## 2017-10-14 DIAGNOSIS — E785 Hyperlipidemia, unspecified: Secondary | ICD-10-CM | POA: Diagnosis not present

## 2017-10-14 DIAGNOSIS — E039 Hypothyroidism, unspecified: Secondary | ICD-10-CM | POA: Diagnosis not present

## 2017-10-14 DIAGNOSIS — Z954 Presence of other heart-valve replacement: Secondary | ICD-10-CM | POA: Diagnosis not present

## 2017-10-14 DIAGNOSIS — Z955 Presence of coronary angioplasty implant and graft: Secondary | ICD-10-CM | POA: Diagnosis not present

## 2017-10-14 DIAGNOSIS — Z79899 Other long term (current) drug therapy: Secondary | ICD-10-CM | POA: Diagnosis not present

## 2017-10-16 DIAGNOSIS — Z954 Presence of other heart-valve replacement: Secondary | ICD-10-CM | POA: Diagnosis not present

## 2017-10-16 DIAGNOSIS — I2510 Atherosclerotic heart disease of native coronary artery without angina pectoris: Secondary | ICD-10-CM | POA: Diagnosis not present

## 2017-10-16 DIAGNOSIS — E785 Hyperlipidemia, unspecified: Secondary | ICD-10-CM | POA: Diagnosis not present

## 2017-10-16 DIAGNOSIS — Z955 Presence of coronary angioplasty implant and graft: Secondary | ICD-10-CM | POA: Diagnosis not present

## 2017-10-16 DIAGNOSIS — Z79899 Other long term (current) drug therapy: Secondary | ICD-10-CM | POA: Diagnosis not present

## 2017-10-16 DIAGNOSIS — E039 Hypothyroidism, unspecified: Secondary | ICD-10-CM | POA: Diagnosis not present

## 2017-10-18 DIAGNOSIS — Z79899 Other long term (current) drug therapy: Secondary | ICD-10-CM | POA: Diagnosis not present

## 2017-10-18 DIAGNOSIS — E039 Hypothyroidism, unspecified: Secondary | ICD-10-CM | POA: Diagnosis not present

## 2017-10-18 DIAGNOSIS — E785 Hyperlipidemia, unspecified: Secondary | ICD-10-CM | POA: Diagnosis not present

## 2017-10-18 DIAGNOSIS — I2510 Atherosclerotic heart disease of native coronary artery without angina pectoris: Secondary | ICD-10-CM | POA: Diagnosis not present

## 2017-10-18 DIAGNOSIS — Z955 Presence of coronary angioplasty implant and graft: Secondary | ICD-10-CM | POA: Diagnosis not present

## 2017-10-18 DIAGNOSIS — Z954 Presence of other heart-valve replacement: Secondary | ICD-10-CM | POA: Diagnosis not present

## 2017-10-21 DIAGNOSIS — H04123 Dry eye syndrome of bilateral lacrimal glands: Secondary | ICD-10-CM | POA: Diagnosis not present

## 2017-10-21 DIAGNOSIS — H43813 Vitreous degeneration, bilateral: Secondary | ICD-10-CM | POA: Diagnosis not present

## 2017-10-21 DIAGNOSIS — H353131 Nonexudative age-related macular degeneration, bilateral, early dry stage: Secondary | ICD-10-CM | POA: Diagnosis not present

## 2017-10-21 DIAGNOSIS — Z961 Presence of intraocular lens: Secondary | ICD-10-CM | POA: Diagnosis not present

## 2017-10-23 DIAGNOSIS — E785 Hyperlipidemia, unspecified: Secondary | ICD-10-CM | POA: Diagnosis not present

## 2017-10-23 DIAGNOSIS — I2510 Atherosclerotic heart disease of native coronary artery without angina pectoris: Secondary | ICD-10-CM | POA: Diagnosis not present

## 2017-10-23 DIAGNOSIS — Z955 Presence of coronary angioplasty implant and graft: Secondary | ICD-10-CM | POA: Diagnosis not present

## 2017-10-23 DIAGNOSIS — E039 Hypothyroidism, unspecified: Secondary | ICD-10-CM | POA: Diagnosis not present

## 2017-10-23 DIAGNOSIS — Z954 Presence of other heart-valve replacement: Secondary | ICD-10-CM | POA: Diagnosis not present

## 2017-10-23 DIAGNOSIS — Z79899 Other long term (current) drug therapy: Secondary | ICD-10-CM | POA: Diagnosis not present

## 2017-10-25 DIAGNOSIS — Z955 Presence of coronary angioplasty implant and graft: Secondary | ICD-10-CM | POA: Diagnosis not present

## 2017-10-25 DIAGNOSIS — E785 Hyperlipidemia, unspecified: Secondary | ICD-10-CM | POA: Diagnosis not present

## 2017-10-25 DIAGNOSIS — I2510 Atherosclerotic heart disease of native coronary artery without angina pectoris: Secondary | ICD-10-CM | POA: Diagnosis not present

## 2017-10-25 DIAGNOSIS — Z79899 Other long term (current) drug therapy: Secondary | ICD-10-CM | POA: Diagnosis not present

## 2017-10-25 DIAGNOSIS — Z954 Presence of other heart-valve replacement: Secondary | ICD-10-CM | POA: Diagnosis not present

## 2017-10-25 DIAGNOSIS — E039 Hypothyroidism, unspecified: Secondary | ICD-10-CM | POA: Diagnosis not present

## 2017-10-30 DIAGNOSIS — Z79899 Other long term (current) drug therapy: Secondary | ICD-10-CM | POA: Diagnosis not present

## 2017-10-30 DIAGNOSIS — Z955 Presence of coronary angioplasty implant and graft: Secondary | ICD-10-CM | POA: Diagnosis not present

## 2017-10-30 DIAGNOSIS — I2510 Atherosclerotic heart disease of native coronary artery without angina pectoris: Secondary | ICD-10-CM | POA: Diagnosis not present

## 2017-10-30 DIAGNOSIS — E785 Hyperlipidemia, unspecified: Secondary | ICD-10-CM | POA: Diagnosis not present

## 2017-10-30 DIAGNOSIS — E039 Hypothyroidism, unspecified: Secondary | ICD-10-CM | POA: Diagnosis not present

## 2017-10-30 DIAGNOSIS — Z954 Presence of other heart-valve replacement: Secondary | ICD-10-CM | POA: Diagnosis not present

## 2017-11-01 DIAGNOSIS — Z955 Presence of coronary angioplasty implant and graft: Secondary | ICD-10-CM | POA: Diagnosis not present

## 2017-11-01 DIAGNOSIS — E039 Hypothyroidism, unspecified: Secondary | ICD-10-CM | POA: Diagnosis not present

## 2017-11-01 DIAGNOSIS — Z954 Presence of other heart-valve replacement: Secondary | ICD-10-CM | POA: Diagnosis not present

## 2017-11-01 DIAGNOSIS — Z79899 Other long term (current) drug therapy: Secondary | ICD-10-CM | POA: Diagnosis not present

## 2017-11-01 DIAGNOSIS — I2510 Atherosclerotic heart disease of native coronary artery without angina pectoris: Secondary | ICD-10-CM | POA: Diagnosis not present

## 2017-11-01 DIAGNOSIS — E785 Hyperlipidemia, unspecified: Secondary | ICD-10-CM | POA: Diagnosis not present

## 2017-11-04 DIAGNOSIS — I2510 Atherosclerotic heart disease of native coronary artery without angina pectoris: Secondary | ICD-10-CM | POA: Diagnosis not present

## 2017-11-04 DIAGNOSIS — E039 Hypothyroidism, unspecified: Secondary | ICD-10-CM | POA: Diagnosis not present

## 2017-11-04 DIAGNOSIS — E785 Hyperlipidemia, unspecified: Secondary | ICD-10-CM | POA: Diagnosis not present

## 2017-11-04 DIAGNOSIS — Z954 Presence of other heart-valve replacement: Secondary | ICD-10-CM | POA: Diagnosis not present

## 2017-11-04 DIAGNOSIS — Z79899 Other long term (current) drug therapy: Secondary | ICD-10-CM | POA: Diagnosis not present

## 2017-11-04 DIAGNOSIS — Z955 Presence of coronary angioplasty implant and graft: Secondary | ICD-10-CM | POA: Diagnosis not present

## 2017-11-05 DIAGNOSIS — M810 Age-related osteoporosis without current pathological fracture: Secondary | ICD-10-CM | POA: Diagnosis not present

## 2017-11-08 ENCOUNTER — Ambulatory Visit (INDEPENDENT_AMBULATORY_CARE_PROVIDER_SITE_OTHER): Payer: Medicare Other | Admitting: Interventional Cardiology

## 2017-11-08 ENCOUNTER — Encounter: Payer: Self-pay | Admitting: Interventional Cardiology

## 2017-11-08 VITALS — BP 100/58 | HR 66 | Ht 65.0 in | Wt 164.8 lb

## 2017-11-08 DIAGNOSIS — I25118 Atherosclerotic heart disease of native coronary artery with other forms of angina pectoris: Secondary | ICD-10-CM

## 2017-11-08 DIAGNOSIS — I10 Essential (primary) hypertension: Secondary | ICD-10-CM | POA: Diagnosis not present

## 2017-11-08 DIAGNOSIS — I209 Angina pectoris, unspecified: Secondary | ICD-10-CM | POA: Diagnosis not present

## 2017-11-08 DIAGNOSIS — E782 Mixed hyperlipidemia: Secondary | ICD-10-CM | POA: Diagnosis not present

## 2017-11-08 MED ORDER — HYDRALAZINE HCL 25 MG PO TABS: 25.0000 mg | ORAL_TABLET | Freq: Two times a day (BID) | ORAL | 3 refills | Status: DC

## 2017-11-08 NOTE — Progress Notes (Signed)
Cardiology Office Note    Date:  11/08/2017   ID:  Tonya Cruz, DOB Apr 08, 1924, MRN [id number]  PCP:  Darrol Jump, PA-C  Cardiologist: Sinclair Grooms, MD   Chief Complaint  Patient presents with  . Coronary Artery Disease    History of Present Illness:  Tonya Cruz is a 84 y.o. female with 3 of breast cancer, hypertension, hyperlipidemia, who suffered a non-ST elevation MI in September 2018 and was noted to have chronically occluded RCA stenting of the mid LAD using DES.  She is doing well.  She denies angina.  She has had one syncopal episode since her myocardial infarction in September.  She denies chest pain.  She has nearly completed cardiac rehab.  Has only 5 additional sessions but she only goes twice a week.  She denies orthopnea, PND, and edema.  Her medication regimen is very complicated.  Past Medical History:  Diagnosis Date  . Anxiety   . Breast cancer ([place])    right side  . Coronary artery disease    06/11/17 PTCA/DES x1 to mLAD, CTO mRCA with col.   [name] GERD (gastroesophageal reflux disease)   . High cholesterol   . Hypertension   . NSTEMI (non-ST elevated myocardial infarction) ([facility] [facility]) 06/11/2017  . Thyroid disease     Past Surgical History:  Procedure Laterality Date  . BREAST SURGERY    . CARDIAC CATHETERIZATION  06/11/2017  . CORONARY STENT INTERVENTION N/A 06/11/2017   Procedure: CORONARY STENT INTERVENTION;  Surgeon: Burnell Blanks, MD;  Location: [facility];  Service: Cardiovascular;  Laterality: N/A;  . LEFT HEART CATH AND CORONARY ANGIOGRAPHY N/A 06/11/2017   Procedure: LEFT HEART CATH AND CORONARY ANGIOGRAPHY;  Surgeon: Burnell Blanks, MD;  Location: [facility];  Service: Cardiovascular;  Laterality: N/A;  . roto cuff      Current Medications: Outpatient Medications Prior to Visit  Medication Sig Dispense Refill  . acetaminophen (TYLENOL) 325 MG tablet Take 650 mg by mouth every 4 (four) hours as  needed for mild pain or moderate pain.    [name] aspirin EC 81 MG tablet Take 81 mg by mouth daily.    [name] buPROPion (WELLBUTRIN) 75 MG tablet Take 75 mg by mouth at bedtime.  0  . carvedilol (COREG) 6.25 MG tablet Take 1 tablet (6.25 mg total) by mouth 2 (two) times daily. 60 tablet 6  . clopidogrel (PLAVIX) 75 MG tablet Take 1 tablet (75 mg total) by mouth daily with breakfast. 90 tablet 3  . denosumab (PROLIA) 60 MG/ML SOLN injection Inject 60 mg into the skin every 6 (six) months. Administer in upper arm, thigh, or abdomen    . levothyroxine (SYNTHROID, LEVOTHROID) 100 MCG tablet Take 100 mcg by mouth every morning.   0  . losartan (COZAAR) 100 MG tablet Take 100 mg by mouth daily.  0  . nitroGLYCERIN (NITROSTAT) 0.4 MG SL tablet Place 1 tablet (0.4 mg total) under the tongue every 5 (five) minutes x 3 doses as needed for chest pain. 25 tablet 2  . pregabalin (LYRICA) 50 MG capsule Take 50 mg by mouth 3 (three) times daily.    . ranitidine (ZANTAC) 150 MG tablet Take 150 mg by mouth 2 (two) times daily.  0  . hydrALAZINE (APRESOLINE) 50 MG tablet Take 50 mg by mouth 3 (three) times daily.    . isosorbide mononitrate (IMDUR) 30 MG 24 hr tablet Take 30 mg by mouth daily.    [name]  amLODipine (NORVASC) 2.5 MG tablet Take 1 tablet (2.5 mg total) by mouth daily. 30 tablet 3  . atorvastatin (LIPITOR) 40 MG tablet Take 1 tablet (40 mg total) by mouth daily. 30 tablet 6  . hydrALAZINE (APRESOLINE) 25 MG tablet Take 50 mg by mouth 3 (three) times daily.   0  . isosorbide mononitrate (IMDUR) 30 MG 24 hr tablet Take 1 tablet (30 mg total) by mouth daily. 30 tablet 3   No facility-administered medications prior to visit.      Allergies:   Celexa [citalopram hydrobromide]; Penicillins; and Ciprofloxacin hcl   Social History   Socioeconomic History  . Marital status: Widowed    Spouse name: None  . Number of children: None  . Years of education: None  . Highest education level: None  Social Needs  .  Financial resource strain: None  . Food insecurity - worry: None  . Food insecurity - inability: None  . Transportation needs - medical: None  . Transportation needs - non-medical: None  Occupational History  . None  Tobacco Use  . Smoking status: Former Research scientist (life sciences)  . Smokeless tobacco: Never Used  Substance and Sexual Activity  . Alcohol use: No  . Drug use: No  . Sexual activity: Not Currently  Other Topics Concern  . None  Social History Narrative  . None     Family History:  The patient's family history includes Breast cancer in her mother; Diabetes in her mother; Other in her father.   ROS:   Please see the history of present illness.    Muscle aches and pains, decreased hearing.  History of shingles with postherpetic syndrome. All other systems reviewed and are negative.   PHYSICAL EXAM:   VS:  BP (!) 100/58   Pulse 66   Ht 5\' 5"  (1.651 m)   Wt 164 lb 12.8 oz (74.8 kg)   SpO2 98%   BMI 27.42 kg/m    GEN: Well nourished, well developed, in no acute distress.  Appears younger than stated age. HEENT: normal  Neck: no JVD, carotid bruits, or masses Cardiac: RRR; no murmurs, rubs, or gallops,no edema  Respiratory:  clear to auscultation bilaterally, normal work of breathing GI: soft, nontender, nondistended, + BS MS: no deformity or atrophy  Skin: warm and dry, no rash Neuro:  Alert and Oriented x 3, Strength and sensation are intact Psych: euthymic mood, full affect  Wt Readings from Last 3 Encounters:  11/08/17 164 lb 12.8 oz (74.8 kg)  07/22/17 162 lb 6.4 oz (73.7 kg)  07/07/17 164 lb 8 oz (74.6 kg)      Studies/Labs Reviewed:   EKG:  EKG not repeated  Recent Labs: 06/11/2017: B Natriuretic Peptide 258.8 07/06/2017: Hemoglobin 11.5; Magnesium 1.9; Platelets 168; TSH 13.056 07/07/2017: BUN 16; Creatinine, Ser 1.15; Potassium 4.4; Sodium 129   Lipid Panel    Component Value Date/Time   CHOL 112 06/09/2017 0400   TRIG 99 06/09/2017 0400   HDL 40 (L)  06/09/2017 0400   CHOLHDL 2.8 06/09/2017 0400   VLDL 20 06/09/2017 0400   LDLCALC 52 06/09/2017 0400    Additional studies/ records that were reviewed today include:  No new data    ASSESSMENT:    1. Coronary artery disease of native artery of native heart with stable angina pectoris ([place])   2. Essential hypertension   3. Mixed hyperlipidemia      PLAN:  In order of problems listed above:  1. Denies angina in the interval.  Terribly complicated medical regimen.  She is on isosorbide mononitrate, hydralazine, and Cozaar.  Other medications include Norvasc Coreg.  Will discontinue isosorbide.  Use sublingual nitroglycerin if angina (has chronically occluded RCA), decrease hydralazine to 25 mg twice daily and consider discontinuation in 1 month.  If blood pressure increases will be able to increase Norvasc 5 mg/day. 2. Blood pressure target at her age should be in the 150/90 mmHg or less range.  She has already had one syncopal episode since her myocardial infarction on the current medical regimen. 3. Most recent LDL was less than 70.  Continue current therapy.  Clinical follow-up in 3-4 weeks for recheck of blood pressure and further medication adjustments in an effort to simplify medical regimen given her age and frailty.    Medication Adjustments/Labs and Tests Ordered: Current medicines are reviewed at length with the patient today.  Concerns regarding medicines are outlined above.  Medication changes, Labs and Tests ordered today are listed in the Patient Instructions below. Patient Instructions  Medication Instructions:  1) DISCONTINUE Isosorbide (Imdur) 2) DECREASE Hydralazine to 25mg  twice daily  Labwork: None  Testing/Procedures: None  Follow-Up: Your physician recommends that you schedule a follow-up appointment in: 3-4 weeks with Dr. Tamala Julian.    Any Other Special Instructions Will Be Listed Below (If Applicable).     If you need a refill on your cardiac  medications before your next appointment, please call your pharmacy.      Signed, Sinclair Grooms, MD  11/08/2017 9:33 AM    [facility] [place], [place], [place]  [address] Phone: [phone]; Fax: [phone]

## 2017-11-08 NOTE — Patient Instructions (Signed)
Medication Instructions:  1) DISCONTINUE Isosorbide (Imdur) 2) DECREASE Hydralazine to 25mg  twice daily  Labwork: None  Testing/Procedures: None  Follow-Up: Your physician recommends that you schedule a follow-up appointment in: 3-4 weeks with Dr. Tamala Julian.    Any Other Special Instructions Will Be Listed Below (If Applicable).     If you need a refill on your cardiac medications before your next appointment, please call your pharmacy.

## 2017-11-11 DIAGNOSIS — I252 Old myocardial infarction: Secondary | ICD-10-CM | POA: Diagnosis not present

## 2017-11-11 DIAGNOSIS — Z955 Presence of coronary angioplasty implant and graft: Secondary | ICD-10-CM | POA: Diagnosis not present

## 2017-11-18 DIAGNOSIS — I252 Old myocardial infarction: Secondary | ICD-10-CM | POA: Diagnosis not present

## 2017-11-18 DIAGNOSIS — Z955 Presence of coronary angioplasty implant and graft: Secondary | ICD-10-CM | POA: Diagnosis not present

## 2017-12-10 NOTE — Progress Notes (Signed)
Cardiology Office Note    Date:  12/11/2017   ID:  Tonya Cruz, DOB [birth year], MRN [id number]  PCP:  Darrol Jump, PA-C  Cardiologist: Sinclair Grooms, MD   Chief Complaint  Patient presents with  . Coronary Artery Disease    History of Present Illness:  Tonya Cruz is a 84 y.o. female with 3 of breast cancer, hypertension, hyperlipidemia, who suffered a non-ST elevation MI in September 2018 and was noted to have chronically occluded RCA stenting of the mid LAD using DES.   She is doing well.  She is concerned about thinning of her hair.  She has not had significant angina.  She denies orthopnea, PND, and extremity swelling.  She has not needed nitroglycerin.  She has successfully completed 12 weeks of phase 2 cardiac rehab.  [facility] and doing some aerobic activity 2 or 3 times per week independently.  She has a Actuary who will accompany her.   Past Medical History:  Diagnosis Date  . Anxiety   . Breast cancer ([place])    right side  . Coronary artery disease    06/11/17 PTCA/DES x1 to mLAD, CTO mRCA with col.   [name] GERD (gastroesophageal reflux disease)   . High cholesterol   . Hypertension   . NSTEMI (non-ST elevated myocardial infarction) ([place]) 06/11/2017  . Thyroid disease     Past Surgical History:  Procedure Laterality Date  . BREAST SURGERY    . CARDIAC CATHETERIZATION  06/11/2017  . CORONARY STENT INTERVENTION N/A 06/11/2017   Procedure: CORONARY STENT INTERVENTION;  Surgeon: Burnell Blanks, MD;  Location: [facility];  Service: Cardiovascular;  Laterality: N/A;  . LEFT HEART CATH AND CORONARY ANGIOGRAPHY N/A 06/11/2017   Procedure: LEFT HEART CATH AND CORONARY ANGIOGRAPHY;  Surgeon: Burnell Blanks, MD;  Location: [facility];  Service: Cardiovascular;  Laterality: N/A;  . roto cuff      Current Medications: Outpatient Medications Prior to Visit  Medication Sig Dispense Refill  . acetaminophen  (TYLENOL) 325 MG tablet Take 650 mg by mouth every 4 (four) hours as needed for mild pain or moderate pain.    [name] aspirin EC 81 MG tablet Take 81 mg by mouth daily.    [name] buPROPion (WELLBUTRIN) 75 MG tablet Take 75 mg by mouth at bedtime.  0  . carvedilol (COREG) 6.25 MG tablet Take 1 tablet (6.25 mg total) by mouth 2 (two) times daily. 60 tablet 6  . clopidogrel (PLAVIX) 75 MG tablet Take 1 tablet (75 mg total) by mouth daily with breakfast. 90 tablet 3  . denosumab (PROLIA) 60 MG/ML SOLN injection Inject 60 mg into the skin every 6 (six) months. Administer in upper arm, thigh, or abdomen    . hydrALAZINE (APRESOLINE) 25 MG tablet Take 1 tablet (25 mg total) by mouth 2 (two) times daily. 180 tablet 3  . isosorbide mononitrate (IMDUR) 30 MG 24 hr tablet Take 30 mg by mouth daily.    [name] levothyroxine (SYNTHROID, LEVOTHROID) 100 MCG tablet Take 100 mcg by mouth every morning.   0  . LORazepam (ATIVAN) 1 MG tablet Take 1 mg by mouth at bedtime as needed for sleep or anxiety.  0  . losartan (COZAAR) 100 MG tablet Take 100 mg by mouth daily.  0  . nitroGLYCERIN (NITROSTAT) 0.4 MG SL tablet Place 1 tablet (0.4 mg total) under the tongue every 5 (five) minutes x 3 doses  as needed for chest pain. 25 tablet 2  . pregabalin (LYRICA) 50 MG capsule Take 50 mg by mouth 3 (three) times daily.    . ranitidine (ZANTAC) 150 MG tablet Take 150 mg by mouth 2 (two) times daily.  0  . amLODipine (NORVASC) 2.5 MG tablet Take 1 tablet (2.5 mg total) by mouth daily. 30 tablet 3  . atorvastatin (LIPITOR) 40 MG tablet Take 1 tablet (40 mg total) by mouth daily. 30 tablet 6   No facility-administered medications prior to visit.      Allergies:   Celexa [citalopram hydrobromide]; Penicillins; and Ciprofloxacin hcl   Social History   Socioeconomic History  . Marital status: Widowed    Spouse name: None  . Number of children: None  . Years of education: None  . Highest education level: None  Social Needs  . Financial  resource strain: None  . Food insecurity - worry: None  . Food insecurity - inability: None  . Transportation needs - medical: None  . Transportation needs - non-medical: None  Occupational History  . None  Tobacco Use  . Smoking status: Former Research scientist (life sciences)  . Smokeless tobacco: Never Used  Substance and Sexual Activity  . Alcohol use: No  . Drug use: No  . Sexual activity: Not Currently  Other Topics Concern  . None  Social History Narrative  . None     Family History:  The patient's family history includes Breast cancer in her mother; Diabetes in her mother; Other in her father.   ROS:   Please see the history of present illness.    Hair loss is her main concern.  I discontinue isosorbide but she resumed it because she felt off the medication there was some tingling in her chest.  On Imdur the tingling has resolved. All other systems reviewed and are negative.   PHYSICAL EXAM:   VS:  BP 130/60   Pulse 62   Ht 5\' 5"  (1.651 m)   Wt 164 lb 1.9 oz (74.4 kg)   BMI 27.31 kg/m    GEN: Well nourished, well developed, in no acute distress  HEENT: normal  Neck: no JVD, carotid bruits, or masses Cardiac: 2/6 systolic murmur.  RRR; no rubs, or gallops,no edema. Respiratory:  clear to auscultation bilaterally, normal work of breathing GI: soft, nontender, nondistended, + BS MS: no deformity or atrophy  Skin: warm and dry, no rash Neuro:  Alert and Oriented x 3, Strength and sensation are intact Psych: euthymic mood, full affect  Wt Readings from Last 3 Encounters:  12/11/17 164 lb 1.9 oz (74.4 kg)  11/08/17 164 lb 12.8 oz (74.8 kg)  07/22/17 162 lb 6.4 oz (73.7 kg)      Studies/Labs Reviewed:   EKG:  EKG  Not repeated.  Recent Labs: 06/11/2017: B Natriuretic Peptide 258.8 07/06/2017: Hemoglobin 11.5; Magnesium 1.9; Platelets 168; TSH 13.056 07/07/2017: BUN 16; Creatinine, Ser 1.15; Potassium 4.4; Sodium 129   Lipid Panel    Component Value Date/Time   CHOL 112 06/09/2017  0400   TRIG 99 06/09/2017 0400   HDL 40 (L) 06/09/2017 0400   CHOLHDL 2.8 06/09/2017 0400   VLDL 20 06/09/2017 0400   LDLCALC 52 06/09/2017 0400    Additional studies/ records that were reviewed today include:  None    ASSESSMENT:    1. Coronary artery disease involving native coronary artery of native heart without angina pectoris   2. Essential hypertension   3. NSTEMI (non-ST elevated myocardial infarction) ([place])  4. Mixed hyperlipidemia      PLAN:  In order of problems listed above:  1. Patient is stable without recurrent angina.  She is ambulatory and doing much more than 95% of people her age.  I encouraged continued aerobic activity but care to avoid falls and injury.  All return in September we will consider discontinuation of clopidogrel. 2. Blood pressures under excellent control on current medical regimen.  No changes. 3. No recurrence of symptoms. 4. LDL was 52 in September.  Blood work will be done by her primary physician later this month.  A copy will be sent to me. Encouraged that the patient consider discussing hair loss with primary care. LDL target should be less than 70.  Liver panel should also be done.  DC clopidogrel and September.  Clinical follow-up in 6 months.   Medication Adjustments/Labs and Tests Ordered: Current medicines are reviewed at length with the patient today.  Concerns regarding medicines are outlined above.  Medication changes, Labs and Tests ordered today are listed in the Patient Instructions below. Patient Instructions  Medication Instructions:  Your physician recommends that you continue on your current medications as directed. Please refer to the Current Medication list given to you today.  Follow-Up: Your physician recommends that you schedule a follow-up appointment in: 6 months with Dr. Tamala Julian.   Any Other Special Instructions Will Be Listed Below (If Applicable).     If you need a refill on your cardiac medications  before your next appointment, please call your pharmacy.      Signed, Sinclair Grooms, MD  12/11/2017 9:43 AM    [facility] [address], [place], [place]  [address] Phone: [phone]; Fax: [phone]

## 2017-12-11 ENCOUNTER — Ambulatory Visit (INDEPENDENT_AMBULATORY_CARE_PROVIDER_SITE_OTHER): Payer: Medicare Other | Admitting: Interventional Cardiology

## 2017-12-11 ENCOUNTER — Encounter: Payer: Self-pay | Admitting: Interventional Cardiology

## 2017-12-11 VITALS — BP 130/60 | HR 62 | Ht 65.0 in | Wt 164.1 lb

## 2017-12-11 DIAGNOSIS — I214 Non-ST elevation (NSTEMI) myocardial infarction: Secondary | ICD-10-CM

## 2017-12-11 DIAGNOSIS — E782 Mixed hyperlipidemia: Secondary | ICD-10-CM | POA: Diagnosis not present

## 2017-12-11 DIAGNOSIS — I10 Essential (primary) hypertension: Secondary | ICD-10-CM | POA: Diagnosis not present

## 2017-12-11 DIAGNOSIS — I2510 Atherosclerotic heart disease of native coronary artery without angina pectoris: Secondary | ICD-10-CM | POA: Diagnosis not present

## 2017-12-11 NOTE — Patient Instructions (Addendum)
Medication Instructions:  Your physician recommends that you continue on your current medications as directed. Please refer to the Current Medication list given to you today.  Follow-Up: Your physician wants you to follow-up in: 6 months with Dr. Tamala Julian.  You will receive a reminder letter in the mail two months in advance. If you don't receive a letter, please call our office to schedule the follow-up appointment.    Any Other Special Instructions Will Be Listed Below (If Applicable).     If you need a refill on your cardiac medications before your next appointment, please call your pharmacy.

## 2017-12-12 DIAGNOSIS — N3001 Acute cystitis with hematuria: Secondary | ICD-10-CM | POA: Diagnosis not present

## 2017-12-17 DIAGNOSIS — E890 Postprocedural hypothyroidism: Secondary | ICD-10-CM | POA: Diagnosis not present

## 2017-12-17 DIAGNOSIS — K219 Gastro-esophageal reflux disease without esophagitis: Secondary | ICD-10-CM | POA: Diagnosis not present

## 2017-12-17 DIAGNOSIS — I10 Essential (primary) hypertension: Secondary | ICD-10-CM | POA: Diagnosis not present

## 2017-12-17 DIAGNOSIS — I2510 Atherosclerotic heart disease of native coronary artery without angina pectoris: Secondary | ICD-10-CM | POA: Diagnosis not present

## 2017-12-17 DIAGNOSIS — B0223 Postherpetic polyneuropathy: Secondary | ICD-10-CM | POA: Diagnosis not present

## 2017-12-17 DIAGNOSIS — I252 Old myocardial infarction: Secondary | ICD-10-CM | POA: Diagnosis not present

## 2017-12-17 DIAGNOSIS — F411 Generalized anxiety disorder: Secondary | ICD-10-CM | POA: Diagnosis not present

## 2017-12-24 DIAGNOSIS — R799 Abnormal finding of blood chemistry, unspecified: Secondary | ICD-10-CM | POA: Diagnosis not present

## 2017-12-24 DIAGNOSIS — E875 Hyperkalemia: Secondary | ICD-10-CM | POA: Diagnosis not present

## 2017-12-30 DIAGNOSIS — L219 Seborrheic dermatitis, unspecified: Secondary | ICD-10-CM | POA: Diagnosis not present

## 2017-12-30 DIAGNOSIS — L820 Inflamed seborrheic keratosis: Secondary | ICD-10-CM | POA: Diagnosis not present

## 2018-04-07 ENCOUNTER — Telehealth: Payer: Self-pay | Admitting: Interventional Cardiology

## 2018-04-07 NOTE — Telephone Encounter (Signed)
Spoke with pt's daughter, DPR on file.  She states over the last few weeks pt's BP has been running low, SBP in 90's, DBP 50-60's.  Denies any sx.  Daughter is a Marine scientist and cut pt's Hydralazine back to once daily a couple of weeks ago.  Last week pt c/o fatigue and BP was low so daughter stopped Hydralazine completely.  Friday pt's BP was 90/58 so daughter stopped pt's Amlodipine.  Yesterday BP was 138/62.  Daughter states this is the best BP she has gotten in awhile.  Daughter really wanted to get pt seen for evaluation since meds are changed now.  Scheduled pt to come in and see Richardson Dopp, PA-C 7/10. Daughter appreciative for call.

## 2018-04-07 NOTE — Telephone Encounter (Signed)
Pt's daughter calling   Pt c/o BP issue: STAT if pt c/o blurred vision, one-sided weakness or slurred speech  1. What are your last 5 BP readings? 90/58  2. Are you having any other symptoms (ex. Dizziness, headache, blurred vision, passed out)? NO  3. What is your BP issue? BP low

## 2018-04-08 ENCOUNTER — Ambulatory Visit (INDEPENDENT_AMBULATORY_CARE_PROVIDER_SITE_OTHER): Payer: Medicare Other | Admitting: Podiatry

## 2018-04-08 ENCOUNTER — Ambulatory Visit (INDEPENDENT_AMBULATORY_CARE_PROVIDER_SITE_OTHER): Payer: Medicare Other

## 2018-04-08 ENCOUNTER — Encounter: Payer: Self-pay | Admitting: Podiatry

## 2018-04-08 VITALS — BP 152/67 | HR 63 | Temp 97.9°F | Resp 16 | Ht 65.0 in | Wt 162.0 lb

## 2018-04-08 DIAGNOSIS — R29898 Other symptoms and signs involving the musculoskeletal system: Secondary | ICD-10-CM | POA: Diagnosis not present

## 2018-04-08 DIAGNOSIS — M7741 Metatarsalgia, right foot: Secondary | ICD-10-CM | POA: Diagnosis not present

## 2018-04-08 DIAGNOSIS — L909 Atrophic disorder of skin, unspecified: Secondary | ICD-10-CM

## 2018-04-08 DIAGNOSIS — B351 Tinea unguium: Secondary | ICD-10-CM

## 2018-04-08 DIAGNOSIS — M7742 Metatarsalgia, left foot: Secondary | ICD-10-CM | POA: Diagnosis not present

## 2018-04-08 DIAGNOSIS — D689 Coagulation defect, unspecified: Secondary | ICD-10-CM | POA: Diagnosis not present

## 2018-04-08 NOTE — Progress Notes (Signed)
   Subjective:    Patient ID: Tonya Cruz, female    DOB: [birth year], 84 y.o.   MRN: [id number]  HPI    Review of Systems  All other systems reviewed and are negative.      Objective:   Physical Exam        Assessment & Plan:

## 2018-04-08 NOTE — Progress Notes (Signed)
  Subjective:  Patient ID: Tonya Cruz, female    DOB: Dec 11, 1923,  MRN: [id number]  No chief complaint on file.  84 y.o. female returns for the above complaint. Reports ingrowing nails she cannot care for herself. On Plavix. Also complains of pain in the balls of both feet.  Objective:  There were no vitals filed for this visit. General AA&O x3. Normal mood and affect.  Vascular Pedal pulses palpable.  Neurologic Epicritic sensation grossly intact.  Dermatologic No open lesions. Skin normal texture and turgor. Toenails x 10 elongated, thickened, dystrophic. Slightly ingrowing.  Orthopedic: No pain to palpation about the toenails. Fat pad atrophy bilat.   Assessment & Plan:  Patient was evaluated and treated and all questions answered.  Onychomycosis with Coagulation defect. -Nails palliatively debrided as below. -Educated on self-care  Procedure: Nail Debridement Rationale: Coag defect. Type of Debridement: manual, sharp debridement. Instrumentation: Nail nipper, rotary burr. Number of Nails: 10  Metataralgia -XR reviewed no osseous abnormality. -Discussed pain 2/2 fat pad atrophy. Discussed well padded shoes.  Return in about 2 months (around 06/17/2018) for Routine Foot Care - On Plavix.

## 2018-04-08 NOTE — Addendum Note (Signed)
Addended by: Allean Found on: 04/08/2018 10:00 AM   Modules accepted: Orders

## 2018-04-09 ENCOUNTER — Other Ambulatory Visit: Payer: Self-pay | Admitting: Podiatry

## 2018-04-09 DIAGNOSIS — M7742 Metatarsalgia, left foot: Principal | ICD-10-CM

## 2018-04-09 DIAGNOSIS — R29898 Other symptoms and signs involving the musculoskeletal system: Secondary | ICD-10-CM

## 2018-04-09 DIAGNOSIS — L909 Atrophic disorder of skin, unspecified: Secondary | ICD-10-CM

## 2018-04-09 DIAGNOSIS — M7741 Metatarsalgia, right foot: Secondary | ICD-10-CM

## 2018-04-09 DIAGNOSIS — D689 Coagulation defect, unspecified: Secondary | ICD-10-CM

## 2018-04-09 DIAGNOSIS — B351 Tinea unguium: Secondary | ICD-10-CM

## 2018-04-16 ENCOUNTER — Encounter: Payer: Self-pay | Admitting: Physician Assistant

## 2018-04-16 ENCOUNTER — Ambulatory Visit (INDEPENDENT_AMBULATORY_CARE_PROVIDER_SITE_OTHER): Payer: Medicare Other | Admitting: Physician Assistant

## 2018-04-16 VITALS — BP 112/56 | HR 61 | Ht 65.0 in | Wt 166.8 lb

## 2018-04-16 DIAGNOSIS — E782 Mixed hyperlipidemia: Secondary | ICD-10-CM | POA: Diagnosis not present

## 2018-04-16 DIAGNOSIS — I10 Essential (primary) hypertension: Secondary | ICD-10-CM

## 2018-04-16 DIAGNOSIS — I25119 Atherosclerotic heart disease of native coronary artery with unspecified angina pectoris: Secondary | ICD-10-CM | POA: Diagnosis not present

## 2018-04-16 MED ORDER — CLOPIDOGREL BISULFATE 75 MG PO TABS: 75.0000 mg | ORAL_TABLET | Freq: Every day | ORAL | 3 refills | Status: DC

## 2018-04-16 MED ORDER — ISOSORBIDE MONONITRATE ER 60 MG PO TB24: 60.0000 mg | ORAL_TABLET | Freq: Every day | ORAL | 3 refills | Status: DC

## 2018-04-16 MED ORDER — LOSARTAN POTASSIUM 100 MG PO TABS: 50.0000 mg | ORAL_TABLET | Freq: Every day | ORAL | Status: DC

## 2018-04-16 MED ORDER — ATORVASTATIN CALCIUM 40 MG PO TABS: 40.0000 mg | ORAL_TABLET | Freq: Every day | ORAL | 3 refills | Status: DC

## 2018-04-16 NOTE — Progress Notes (Signed)
Cardiology Office Note:    Date:  04/16/2018   ID:  Tonya Cruz, DOB Apr 25, 1924, MRN [id number]  PCP:  Darrol Jump, PA-C  Cardiologist:  Sinclair Grooms, MD    Referring MD: Darrol Jump, PA-C   Chief Complaint  Patient presents with  . Chest Pain    History of Present Illness:    Tonya Cruz is a 84 y.o. female with coronary artery disease status post non-ST elevation myocardial infarction in September 2018 treated with drug-eluting stent to the LAD.  She has a known chronic total occlusion of the RCA.  Other history includes breast cancer, hypertension, hyperlipidemia.  Patient was last seen by Dr. Tamala Julian in March 2019.  The patient's daughter called in recently to note low blood pressures.  Hydralazine and amlodipine were both held with improved blood pressure.  Tonya Cruz returns for evaluation of chest pain and BP issues.  She is here today with her 2 daughters.  Over the past 2 weeks, she has taken NTG 3 times for chest pain.  The discomfort subsides with NTG>  She describes it as tingling.  Her daughter notes that she described her chest pain with her myocardial infarction as tingling.  She has not taken NTG since her MI.  She does get short of breath with the pain.  She thinks she may be more short of breath with activity.  However, she has not been as active since she "graduated" from cardiac rehabilitation.  She denies paroxysmal nocturnal dyspnea, significant edema, syncope.  Her blood pressure has been running low and she tapered off of the hydralazine.  She then had to stop the Amlodipine.  She thinks that she probably feels better since stopping the hydralazine and amlodipine.  She has also taken ativan with relief of symptoms.  She did have similar symptoms after her MI when she tried to come off of the Imdur.  Her symptoms resolved when she resumed the Imdur.  She has called her daughter a few times and noted that she did not feel good and was worried on one  occasion because her neighbors were out of town.    Prior CV studies:   The following studies were reviewed today:  Cardiac catheterization 06/11/2017 LAD proximal to mid 99, mid 30, distal 40 RCA mid 100-CTO, left to right collaterals PCI: 3 x 16 mm Synergy DES to the proximal-mid LAD  Echocardiogram 06/09/2017 EF 60-65, normal wall motion, grade 2 diastolic dysfunction, aortic sclerosis without stenosis, mild AI, mild MR, mild LAE, mild TR, PASP 52  Past Medical History:  Diagnosis Date  . Anxiety   . Breast cancer ([place])    right side  . Coronary artery disease    06/11/17 PTCA/DES x1 to mLAD, CTO mRCA with col.   [name] GERD (gastroesophageal reflux disease)   . High cholesterol   . Hypertension   . NSTEMI (non-ST elevated myocardial infarction) ([place]) 06/11/2017  . Thyroid disease    Surgical Hx: The patient  has a past surgical history that includes roto cuff; Breast surgery; LEFT HEART CATH AND CORONARY ANGIOGRAPHY (N/A, 06/11/2017); CORONARY STENT INTERVENTION (N/A, 06/11/2017); and Cardiac catheterization (06/11/2017).   Current Medications: Current Meds  Medication Sig  . aspirin EC 81 MG tablet Take 81 mg by mouth daily.  [name] buPROPion (WELLBUTRIN) 75 MG tablet Take 75 mg by mouth at bedtime.  . carvedilol (COREG) 6.25 MG tablet Take 1 tablet (6.25 mg total) by mouth 2 (two) times daily.  [name]  levothyroxine (SYNTHROID, LEVOTHROID) 100 MCG tablet Take 100 mcg by mouth every morning.   . pregabalin (LYRICA) 50 MG capsule Take 50 mg by mouth 3 (three) times daily.  . ranitidine (ZANTAC) 150 MG tablet Take 150 mg by mouth 2 (two) times daily.  . [DISCONTINUED] atorvastatin (LIPITOR) 40 MG tablet Take 40 mg by mouth daily.  . [DISCONTINUED] atorvastatin (LIPITOR) 40 MG tablet Take 1 tablet (40 mg total) by mouth daily.  . [DISCONTINUED] isosorbide mononitrate (IMDUR) 30 MG 24 hr tablet Take 30 mg by mouth daily.  . [DISCONTINUED] losartan (COZAAR) 100 MG tablet Take 100 mg by mouth daily.      Allergies:   Celexa [citalopram hydrobromide]; Penicillins; and Ciprofloxacin hcl   Social History   Tobacco Use  . Smoking status: Former Research scientist (life sciences)  . Smokeless tobacco: Never Used  Substance Use Topics  . Alcohol use: No  . Drug use: No     Family Hx: The patient's family history includes Breast cancer in her mother; Diabetes in her mother; Other in her father.  ROS:   Please see the history of present illness.    Review of Systems  Cardiovascular: Positive for chest pain and palpitations.  Psychiatric/Behavioral: The patient is nervous/anxious.    All other systems reviewed and are negative.   EKGs/Labs/Other Test Reviewed:    EKG:  EKG is  ordered today.  The ekg ordered today demonstrates NSR, HR 61, normal axis, QTc 400, similar to prior tracings  Recent Labs: 06/11/2017: B Natriuretic Peptide 258.8 07/06/2017: Hemoglobin 11.5; Magnesium 1.9; Platelets 168; TSH 13.056 07/07/2017: BUN 16; Creatinine, Ser 1.15; Potassium 4.4; Sodium 129   Recent Lipid Panel Lab Results  Component Value Date/Time   CHOL 112 06/09/2017 04:00 AM   TRIG 99 06/09/2017 04:00 AM   HDL 40 (L) 06/09/2017 04:00 AM   CHOLHDL 2.8 06/09/2017 04:00 AM   LDLCALC 52 06/09/2017 04:00 AM    Physical Exam:    VS:  BP (!) 112/56   Pulse 61   Ht 5\' 5"  (1.651 m)   Wt 166 lb 12.8 oz (75.7 kg)   SpO2 94%   BMI 27.76 kg/m     Wt Readings from Last 3 Encounters:  04/16/18 166 lb 12.8 oz (75.7 kg)  04/08/18 162 lb (73.5 kg)  12/11/17 164 lb 1.9 oz (74.4 kg)     Physical Exam  Constitutional: She is oriented to person, place, and time. She appears well-developed and well-nourished. No distress.  HENT:  Head: Normocephalic and atraumatic.  Neck: Neck supple. No JVD present.  Cardiovascular: Normal rate, regular rhythm, S1 normal, S2 normal and normal heart sounds.  No murmur heard. Pulmonary/Chest: Effort normal. She has no rales.  Abdominal: Soft. There is no hepatomegaly.  Musculoskeletal:  She exhibits edema (trace R leg edema).  Neurological: She is alert and oriented to person, place, and time.  Skin: Skin is warm and dry.    ASSESSMENT & PLAN:    Coronary artery disease involving native coronary artery of native heart with angina pectoris ([place]) Hx of MI in 06/2017 treated with DES to the LAD.  Her RCA is chronically occluded.  She has had fairly symptoms without angina on beta-blocker, long acting nitrates for several months.  Recently, she has had recurrent symptoms somewhat reminiscent of her prior angina despite remaining on beta-blocker, nitrates.  She did have to stop her amlodipine and hydralazine due to low BP.  Her ECG is unchanged.  I discussed her case with Dr.  Smith today.  A nuclear stress test would not be helpful given her chronically occluded RCA.  Given her advanced age, we would like to pursue medical therapy as much as possible.  -Decrease losartan to 50 mg daily  -Increase isosorbide to 60 mg daily  -Continue aspirin, clopidogrel, statin, beta-blocker  -Return for follow-up with Dr. Tamala Julian in September as planned  -Return sooner if symptoms progress  Essential hypertension Blood pressure somewhat better since changing her medications.  Decrease losartan to allow up titration of isosorbide as noted.  Mixed hyperlipidemia   Continue moderate intensity statin.   Dispo:  Return in about 3 months (around 07/04/2018) for Scheduled Follow Up w/ Dr. Tamala Julian.   Medication Adjustments/Labs and Tests Ordered: Current medicines are reviewed at length with the patient today.  Concerns regarding medicines are outlined above.  Tests Ordered: Orders Placed This Encounter  Procedures  . EKG 12-Lead   Medication Changes: Meds ordered this encounter  Medications  . clopidogrel (PLAVIX) 75 MG tablet    Sig: Take 1 tablet (75 mg total) by mouth daily.    Dispense:  90 tablet    Refill:  3  . losartan (COZAAR) 100 MG tablet    Sig: Take 0.5 tablets (50 mg total) by  mouth daily.  . isosorbide mononitrate (IMDUR) 60 MG 24 hr tablet    Sig: Take 1 tablet (60 mg total) by mouth daily.    Dispense:  90 tablet    Refill:  3  . DISCONTD: atorvastatin (LIPITOR) 40 MG tablet    Sig: Take 1 tablet (40 mg total) by mouth daily.    Dispense:  90 tablet    Refill:  3    Signed, Richardson Dopp, PA-C  04/16/2018 4:18 PM    [facility] [address], [place], [place]  [address] Phone: [phone]; Fax: [phone]

## 2018-04-16 NOTE — Patient Instructions (Signed)
Medication Instructions:  1. REFILL HAS BEEN SENT IN FOR LIPITOR  2. INCREASE IMDUR TO 60 MG DAILY; YOU MAY DOUBLE THE 30 MG TABLETS; NEW RX HAS BEEN SENT IN  FOR THE 60 MG TABLET  3. DECREASE LOSARTAN TO 50 MG DAILY  Labwork: NONE ORDERED TODAY  Testing/Procedures: NONE ORDERED TODAY  Follow-Up: KEEP YOUR APPT WITH DR. Tamala Julian 07/04/18   Any Other Special Instructions Will Be Listed Below (If Applicable).     If you need a refill on your cardiac medications before your next appointment, please call your pharmacy.

## 2018-04-21 DIAGNOSIS — M25512 Pain in left shoulder: Secondary | ICD-10-CM | POA: Diagnosis not present

## 2018-05-01 DIAGNOSIS — N3000 Acute cystitis without hematuria: Secondary | ICD-10-CM | POA: Diagnosis not present

## 2018-05-09 ENCOUNTER — Other Ambulatory Visit: Payer: Self-pay

## 2018-05-21 DIAGNOSIS — M810 Age-related osteoporosis without current pathological fracture: Secondary | ICD-10-CM | POA: Diagnosis not present

## 2018-05-21 DIAGNOSIS — E538 Deficiency of other specified B group vitamins: Secondary | ICD-10-CM | POA: Diagnosis not present

## 2018-06-10 ENCOUNTER — Ambulatory Visit: Payer: Medicare Other | Admitting: Podiatry

## 2018-06-24 ENCOUNTER — Encounter: Payer: Self-pay | Admitting: Podiatry

## 2018-06-24 ENCOUNTER — Ambulatory Visit (INDEPENDENT_AMBULATORY_CARE_PROVIDER_SITE_OTHER): Payer: Medicare Other | Admitting: Podiatry

## 2018-06-24 VITALS — BP 120/88 | HR 80 | Resp 15

## 2018-06-24 DIAGNOSIS — D689 Coagulation defect, unspecified: Secondary | ICD-10-CM

## 2018-06-24 DIAGNOSIS — B351 Tinea unguium: Secondary | ICD-10-CM

## 2018-06-24 NOTE — Progress Notes (Signed)
Subjective:  Patient ID: Tonya Cruz, female    DOB: 09/16/1924,  MRN: [id number]  Chief Complaint  Patient presents with  . debride    BL nail trimming -not diabetic -pt on BT    84 y.o. female presents with the above complaint.  Reports elongated nails to both feet. On plavix. No new complaints.  Review of Systems: Negative except as noted in the HPI. Denies N/V/F/Ch.  Past Medical History:  Diagnosis Date  . Anxiety   . Breast cancer ([place])    right side  . Coronary artery disease    06/11/17 PTCA/DES x1 to mLAD, CTO mRCA with col.   [name] GERD (gastroesophageal reflux disease)   . High cholesterol   . Hypertension   . NSTEMI (non-ST elevated myocardial infarction) ([place]) 06/11/2017  . Thyroid disease     Current Outpatient Medications:  .  aspirin EC 81 MG tablet, Take 81 mg by mouth daily., Disp: , Rfl:  .  buPROPion (WELLBUTRIN) 75 MG tablet, Take 75 mg by mouth at bedtime., Disp: , Rfl: 0 .  carvedilol (COREG) 6.25 MG tablet, Take 1 tablet (6.25 mg total) by mouth 2 (two) times daily., Disp: 60 tablet, Rfl: 6 .  clopidogrel (PLAVIX) 75 MG tablet, Take 1 tablet (75 mg total) by mouth daily., Disp: 90 tablet, Rfl: 3 .  isosorbide mononitrate (IMDUR) 60 MG 24 hr tablet, Take 1 tablet (60 mg total) by mouth daily., Disp: 90 tablet, Rfl: 3 .  levothyroxine (SYNTHROID, LEVOTHROID) 100 MCG tablet, Take 100 mcg by mouth every morning. , Disp: , Rfl: 0 .  losartan (COZAAR) 100 MG tablet, Take 0.5 tablets (50 mg total) by mouth daily., Disp: , Rfl:  .  pregabalin (LYRICA) 50 MG capsule, Take 50 mg by mouth 3 (three) times daily., Disp: , Rfl:  .  ranitidine (ZANTAC) 150 MG tablet, Take 150 mg by mouth 2 (two) times daily., Disp: , Rfl: 0 .  amLODipine (NORVASC) 2.5 MG tablet, Take 1 tablet (2.5 mg total) by mouth daily., Disp: 30 tablet, Rfl: 3 .  atorvastatin (LIPITOR) 40 MG tablet, Take 1 tablet (40 mg total) by mouth daily., Disp: 30 tablet, Rfl: 6  Social History   Tobacco  Use  Smoking Status Former Smoker  Smokeless Tobacco Never Used    Allergies  Allergen Reactions  . Celexa [Citalopram Hydrobromide] Other (See Comments)    Low sodium  . Penicillins     Has patient had a PCN reaction causing immediate rash, facial/tongue/throat swelling, SOB or lightheadedness with hypotension: No Has patient had a PCN reaction causing severe rash involving mucus membranes or skin necrosis: No Has patient had a PCN reaction that required hospitalization: No Has patient had a PCN reaction occurring within the last 10 years: No If all of the above answers are "NO", then may proceed with Cephalosporin use.  . Ciprofloxacin Hcl Rash   Objective:   Vitals:   06/24/18 1001  BP: 120/88  Pulse: 80  Resp: 15   There is no height or weight on file to calculate BMI. Constitutional Well developed. Well nourished.  Vascular Dorsalis pedis pulses palpable bilaterally. Posterior tibial pulses palpable bilaterally. Capillary refill normal to all digits.  No cyanosis or clubbing noted. Pedal hair growth normal.  Neurologic Normal speech. Oriented to person, place, and time. Epicritic sensation to light touch grossly present bilaterally.  Dermatologic Nails elongated dystrophic pain to palpation No open wounds. No skin lesions.  Orthopedic: Normal joint ROM without pain or  crepitus bilaterally. No visible deformities. No bony tenderness.   Radiographs: None Assessment:   1. Onychomycosis   2. Coagulation defect [facility])    Plan:  Patient was evaluated and treated and all questions answered.  Onychomycosis with coagulation defect -Nails palliatively debridement as below -Educated on self-care  Procedure: Nail Debridement Rationale: Pain Type of Debridement: manual, sharp debridement. Instrumentation: Nail nipper, rotary burr. Number of Nails: 10    Return in about 2 months (around 08/25/2018).

## 2018-07-03 NOTE — Progress Notes (Signed)
Cardiology Office Note:    Date:  07/04/2018   ID:  Tonya Cruz, DOB Nov 11, 1923, MRN [id number]  PCP:  Darrol Jump, PA-C  Cardiologist:  Sinclair Grooms, MD   Referring MD: Darrol Jump, PA-C   Chief Complaint  Patient presents with  . Coronary Artery Disease    History of Present Illness:    Tonya Cruz is a 84 y.o. female with a hx of breast cancer, hypertension, hyperlipidemia, non-ST elevation MI in September 2018, noted to have chronically occluded RCA, and had stenting of the mid LAD using DES.    She she is doing well.  She denies angina.  No episodes of syncope.  No anginal episodes.  No nitroglycerin use.  Denies lower extremity edema, orthopnea, and PND.  Still having easy bruising on dual antiplatelet therapy.   Past Medical History:  Diagnosis Date  . Anxiety   . Breast cancer ([place])    right side  . Coronary artery disease    06/11/17 PTCA/DES x1 to mLAD, CTO mRCA with col.   [name] GERD (gastroesophageal reflux disease)   . High cholesterol   . Hypertension   . NSTEMI (non-ST elevated myocardial infarction) ([place]) 06/11/2017  . Thyroid disease     Past Surgical History:  Procedure Laterality Date  . BREAST SURGERY    . CARDIAC CATHETERIZATION  06/11/2017  . CORONARY STENT INTERVENTION N/A 06/11/2017   Procedure: CORONARY STENT INTERVENTION;  Surgeon: Burnell Blanks, MD;  Location: [facility];  Service: Cardiovascular;  Laterality: N/A;  . LEFT HEART CATH AND CORONARY ANGIOGRAPHY N/A 06/11/2017   Procedure: LEFT HEART CATH AND CORONARY ANGIOGRAPHY;  Surgeon: Burnell Blanks, MD;  Location: [facility];  Service: Cardiovascular;  Laterality: N/A;  . roto cuff      Current Medications: Current Meds  Medication Sig  . aspirin EC 81 MG tablet Take 81 mg by mouth daily.  [name] atorvastatin (LIPITOR) 40 MG tablet Take 40 mg by mouth daily.  [name] buPROPion (WELLBUTRIN) 75 MG tablet Take 75 mg by mouth at bedtime.  .  carvedilol (COREG) 6.25 MG tablet Take 1 tablet (6.25 mg total) by mouth 2 (two) times daily.  . isosorbide mononitrate (IMDUR) 60 MG 24 hr tablet Take 1 tablet (60 mg total) by mouth daily.  [name] levothyroxine (SYNTHROID, LEVOTHROID) 100 MCG tablet Take 100 mcg by mouth every morning.   [name] LORazepam (ATIVAN) 1 MG tablet Take half (1/2) tablet by mouth daily at bedtime as needed for anxiety or sleep.  . pregabalin (LYRICA) 50 MG capsule Take 50 mg by mouth 3 (three) times daily.  . ranitidine (ZANTAC) 150 MG tablet Take 150 mg by mouth 2 (two) times daily.  . [DISCONTINUED] clopidogrel (PLAVIX) 75 MG tablet Take 1 tablet (75 mg total) by mouth daily.     Allergies:   Celexa [citalopram hydrobromide]; Penicillins; and Ciprofloxacin hcl   Social History   Socioeconomic History  . Marital status: Widowed    Spouse name: Not on file  . Number of children: Not on file  . Years of education: Not on file  . Highest education level: Not on file  Occupational History  . Not on file  Social Needs  . Financial resource strain: Not on file  . Food insecurity:    Worry: Not on file    Inability: Not on file  . Transportation needs:    Medical: Not on file    Non-medical: Not on file  Tobacco Use  . Smoking status: Former Research scientist (life sciences)  . Smokeless tobacco: Never Used  Substance and Sexual Activity  . Alcohol use: No  . Drug use: No  . Sexual activity: Not Currently  Lifestyle  . Physical activity:    Days per week: Not on file    Minutes per session: Not on file  . Stress: Not on file  Relationships  . Social connections:    Talks on phone: Not on file    Gets together: Not on file    Attends religious service: Not on file    Active member of club or organization: Not on file    Attends meetings of clubs or organizations: Not on file    Relationship status: Not on file  Other Topics Concern  . Not on file  Social History Narrative  . Not on file     Family History: The patient's  family history includes Breast cancer in her mother; Diabetes in her mother; Other in her father.  ROS:   Please see the history of present illness.    Has indigestion that is relieved by Zantac also has episodes of fluttering/nausea in the substernal area that he can last up to 1 to 2 minutes.  These episodes occur at rest and are not precipitated by activity.  She has occasional dizziness.  All other systems reviewed and are negative.  EKGs/Labs/Other Studies Reviewed:    The following studies were reviewed today: No new data  EKG:  EKG is not ordered today.  Recent Labs: 07/06/2017: Hemoglobin 11.5; Magnesium 1.9; Platelets 168; TSH 13.056 07/07/2017: BUN 16; Creatinine, Ser 1.15; Potassium 4.4; Sodium 129  Recent Lipid Panel    Component Value Date/Time   CHOL 112 06/09/2017 0400   TRIG 99 06/09/2017 0400   HDL 40 (L) 06/09/2017 0400   CHOLHDL 2.8 06/09/2017 0400   VLDL 20 06/09/2017 0400   LDLCALC 52 06/09/2017 0400    Physical Exam:    VS:  BP (!) 126/52   Pulse 63   Ht 5\' 5"  (1.651 m)   Wt 165 lb 3.2 oz (74.9 kg)   BMI 27.49 kg/m     Wt Readings from Last 3 Encounters:  07/04/18 165 lb 3.2 oz (74.9 kg)  04/16/18 166 lb 12.8 oz (75.7 kg)  04/08/18 162 lb (73.5 kg)     GEN: Appears younger than stated age.  Well nourished, well developed in no acute distress HEENT: Normal NECK: No JVD. LYMPHATICS: No lymphadenopathy CARDIAC: RRR, no murmur, no gallop, no edema. VASCULAR: 2+ bilateral radial pulses.  No bruits. RESPIRATORY:  Clear to auscultation without rales, wheezing or rhonchi  ABDOMEN: Soft, non-tender, non-distended, No pulsatile mass, MUSCULOSKELETAL: No deformity  SKIN: Warm and dry NEUROLOGIC:  Alert and oriented x 3 PSYCHIATRIC:  Normal affect   ASSESSMENT:    1. Coronary artery disease involving native coronary artery of native heart with angina pectoris ([place])   2. Essential hypertension   3. Mixed hyperlipidemia    PLAN:    In order of  problems listed above:  1. CAD, chronic total occlusion of the right coronary.  LAD stented 1 year ago.  Plavix will be discontinued today.  Notify us of any chest discomfort.  Continue risk factor modification. 2. Target blood pressure 130/80 mmHg.  Currently at target. 3. Target LDL less than 70.  Currently at target with most recent LDL of 38.  Last study done in March 2019.  Continue current medical regimen minus Plavix.  Continue high intensity statin therapy for risk reduction.   Medication Adjustments/Labs and Tests Ordered: Current medicines are reviewed at length with the patient today.  Concerns regarding medicines are outlined above.  No orders of the defined types were placed in this encounter.  No orders of the defined types were placed in this encounter.   There are no Patient Instructions on file for this visit.   Signed, Sinclair Grooms, MD  07/04/2018 10:15 AM    [place]

## 2018-07-04 ENCOUNTER — Ambulatory Visit (INDEPENDENT_AMBULATORY_CARE_PROVIDER_SITE_OTHER): Payer: Medicare Other | Admitting: Interventional Cardiology

## 2018-07-04 ENCOUNTER — Encounter: Payer: Self-pay | Admitting: Interventional Cardiology

## 2018-07-04 VITALS — BP 126/52 | HR 63 | Ht 65.0 in | Wt 165.2 lb

## 2018-07-04 DIAGNOSIS — I10 Essential (primary) hypertension: Secondary | ICD-10-CM | POA: Diagnosis not present

## 2018-07-04 DIAGNOSIS — I25119 Atherosclerotic heart disease of native coronary artery with unspecified angina pectoris: Secondary | ICD-10-CM

## 2018-07-04 DIAGNOSIS — Z23 Encounter for immunization: Secondary | ICD-10-CM

## 2018-07-04 DIAGNOSIS — E782 Mixed hyperlipidemia: Secondary | ICD-10-CM | POA: Diagnosis not present

## 2018-07-04 NOTE — Patient Instructions (Signed)
Medication Instructions:  1) DISCONTINUE Plavix  Labwork: None  Testing/Procedures: None  Follow-Up: Your physician wants you to follow-up in: 1 year with Dr. Smith.  You will receive a reminder letter in the mail two months in advance. If you don't receive a letter, please call our office to schedule the follow-up appointment.   Any Other Special Instructions Will Be Listed Below (If Applicable).     If you need a refill on your cardiac medications before your next appointment, please call your pharmacy.   

## 2018-07-07 DIAGNOSIS — H02131 Senile ectropion of right upper eyelid: Secondary | ICD-10-CM | POA: Diagnosis not present

## 2018-07-07 DIAGNOSIS — H04123 Dry eye syndrome of bilateral lacrimal glands: Secondary | ICD-10-CM | POA: Diagnosis not present

## 2018-08-12 DIAGNOSIS — Z1231 Encounter for screening mammogram for malignant neoplasm of breast: Secondary | ICD-10-CM | POA: Diagnosis not present

## 2018-08-27 DIAGNOSIS — I2510 Atherosclerotic heart disease of native coronary artery without angina pectoris: Secondary | ICD-10-CM | POA: Diagnosis not present

## 2018-08-27 DIAGNOSIS — E890 Postprocedural hypothyroidism: Secondary | ICD-10-CM | POA: Diagnosis not present

## 2018-08-27 DIAGNOSIS — F411 Generalized anxiety disorder: Secondary | ICD-10-CM | POA: Diagnosis not present

## 2018-08-27 DIAGNOSIS — I119 Hypertensive heart disease without heart failure: Secondary | ICD-10-CM | POA: Diagnosis not present

## 2018-08-27 DIAGNOSIS — K219 Gastro-esophageal reflux disease without esophagitis: Secondary | ICD-10-CM | POA: Diagnosis not present

## 2018-09-17 DIAGNOSIS — Z79899 Other long term (current) drug therapy: Secondary | ICD-10-CM | POA: Diagnosis not present

## 2018-09-17 DIAGNOSIS — I361 Nonrheumatic tricuspid (valve) insufficiency: Secondary | ICD-10-CM | POA: Diagnosis not present

## 2018-09-17 DIAGNOSIS — I480 Paroxysmal atrial fibrillation: Secondary | ICD-10-CM | POA: Diagnosis not present

## 2018-09-17 DIAGNOSIS — I4891 Unspecified atrial fibrillation: Secondary | ICD-10-CM | POA: Diagnosis not present

## 2018-09-17 DIAGNOSIS — I252 Old myocardial infarction: Secondary | ICD-10-CM | POA: Diagnosis not present

## 2018-09-17 DIAGNOSIS — R002 Palpitations: Secondary | ICD-10-CM | POA: Diagnosis not present

## 2018-09-17 DIAGNOSIS — E785 Hyperlipidemia, unspecified: Secondary | ICD-10-CM | POA: Diagnosis not present

## 2018-09-17 DIAGNOSIS — E039 Hypothyroidism, unspecified: Secondary | ICD-10-CM | POA: Diagnosis not present

## 2018-09-17 DIAGNOSIS — I472 Ventricular tachycardia: Secondary | ICD-10-CM | POA: Diagnosis not present

## 2018-09-17 DIAGNOSIS — Z853 Personal history of malignant neoplasm of breast: Secondary | ICD-10-CM | POA: Diagnosis not present

## 2018-09-17 DIAGNOSIS — K219 Gastro-esophageal reflux disease without esophagitis: Secondary | ICD-10-CM | POA: Diagnosis not present

## 2018-09-17 DIAGNOSIS — I2510 Atherosclerotic heart disease of native coronary artery without angina pectoris: Secondary | ICD-10-CM | POA: Diagnosis not present

## 2018-09-17 DIAGNOSIS — I249 Acute ischemic heart disease, unspecified: Secondary | ICD-10-CM | POA: Diagnosis not present

## 2018-09-17 DIAGNOSIS — I10 Essential (primary) hypertension: Secondary | ICD-10-CM | POA: Diagnosis not present

## 2018-09-17 DIAGNOSIS — R079 Chest pain, unspecified: Secondary | ICD-10-CM | POA: Diagnosis not present

## 2018-09-18 DIAGNOSIS — J45909 Unspecified asthma, uncomplicated: Secondary | ICD-10-CM | POA: Diagnosis not present

## 2018-09-18 DIAGNOSIS — Z7901 Long term (current) use of anticoagulants: Secondary | ICD-10-CM | POA: Diagnosis not present

## 2018-09-18 DIAGNOSIS — I252 Old myocardial infarction: Secondary | ICD-10-CM | POA: Diagnosis not present

## 2018-09-18 DIAGNOSIS — Z79899 Other long term (current) drug therapy: Secondary | ICD-10-CM | POA: Diagnosis not present

## 2018-09-18 DIAGNOSIS — I10 Essential (primary) hypertension: Secondary | ICD-10-CM | POA: Diagnosis not present

## 2018-09-18 DIAGNOSIS — Z87891 Personal history of nicotine dependence: Secondary | ICD-10-CM | POA: Diagnosis not present

## 2018-09-18 DIAGNOSIS — I4891 Unspecified atrial fibrillation: Secondary | ICD-10-CM | POA: Diagnosis not present

## 2018-09-18 DIAGNOSIS — E039 Hypothyroidism, unspecified: Secondary | ICD-10-CM | POA: Diagnosis not present

## 2018-09-18 DIAGNOSIS — I480 Paroxysmal atrial fibrillation: Secondary | ICD-10-CM | POA: Diagnosis not present

## 2018-09-23 ENCOUNTER — Ambulatory Visit: Payer: Medicare Other | Admitting: Podiatry

## 2018-10-16 NOTE — Progress Notes (Signed)
Cardiology Office Note   Date:  10/18/2018   ID:  Tonya Cruz, DOB May 04, 1924, MRN [id number]  PCP:  Darrol Jump, PA-C  Cardiologist:  Dr. Tamala Julian    Chief Complaint  Patient presents with  . Hospitalization Follow-up    a fib       History of Present Illness: Tonya Cruz is a 85 y.o. female who presents for new a fib. Post hospital.  Hhx of breast cancer, hypertension, hyperlipidemia, non-ST elevation MI in September 2018, noted to She has a hx of chronically occluded RCA, and had stenting of the mid LAD using DES.   Plavix stopped on last visit.   Pt went into rapid a fib with chest pain 09/17/18 and was seen in ER at [facility].  pk troponin 0.06.  She was given IV dilt and converted to SR.  With Cha2DS2Vasc of 5 and she was placed on eliquis. Her echo with mild concentric LVH.  EF 55-60%, mild AR and MR and mild to mod TR.  (she was seen and d/c'd initially with coreg but a fib returned and dilt was added.    She has done well since then.  No chest pain or SOB -her 89rd birthday is Monday.  No bleeding with the Eliquis.  Her daughters are with her today.  They all note the holidays did wear away her energy.      Past Medical History:  Diagnosis Date  . Anxiety   . Breast cancer ([place])    right side  . Coronary artery disease    06/11/17 PTCA/DES x1 to mLAD, CTO mRCA with col.   [name] GERD (gastroesophageal reflux disease)   . High cholesterol   . Hypertension   . NSTEMI (non-ST elevated myocardial infarction) ([place]) 06/11/2017  . Thyroid disease     Past Surgical History:  Procedure Laterality Date  . BREAST SURGERY    . CARDIAC CATHETERIZATION  06/11/2017  . CORONARY STENT INTERVENTION N/A 06/11/2017   Procedure: CORONARY STENT INTERVENTION;  Surgeon: Burnell Blanks, MD;  Location: [facility];  Service: Cardiovascular;  Laterality: N/A;  . LEFT HEART CATH AND CORONARY ANGIOGRAPHY N/A 06/11/2017   Procedure: LEFT HEART CATH AND CORONARY  ANGIOGRAPHY;  Surgeon: Burnell Blanks, MD;  Location: [facility];  Service: Cardiovascular;  Laterality: N/A;  . roto cuff       Current Outpatient Medications  Medication Sig Dispense Refill  . apixaban (ELIQUIS) 5 MG TABS tablet Take 1 tablet (5 mg total) by mouth 2 (two) times daily. 60 tablet 11  . atorvastatin (LIPITOR) 40 MG tablet Take 40 mg by mouth daily.    [name] buPROPion (WELLBUTRIN) 75 MG tablet Take 75 mg by mouth at bedtime.  0  . carvedilol (COREG) 6.25 MG tablet Take 1 tablet (6.25 mg total) by mouth 2 (two) times daily. 60 tablet 6  . levothyroxine (SYNTHROID, LEVOTHROID) 100 MCG tablet Take 100 mcg by mouth every morning.   0  . LORazepam (ATIVAN) 1 MG tablet Take half (1/2) tablet by mouth daily at bedtime as needed for anxiety or sleep.  0  . pantoprazole (PROTONIX) 40 MG tablet Take 40 mg by mouth every morning.    . pregabalin (LYRICA) 50 MG capsule Take 50 mg by mouth 3 (three) times daily.    . ranitidine (ZANTAC) 150 MG tablet Take 150 mg by mouth every evening.    . diltiazem (CARDIZEM CD) 120 MG 24 hr capsule Take 1 capsule (  120 mg total) by mouth daily. 90 capsule 3  . isosorbide mononitrate (IMDUR) 60 MG 24 hr tablet Take 1 tablet (60 mg total) by mouth daily. 90 tablet 3   No current facility-administered medications for this visit.     Allergies:   Celexa [citalopram hydrobromide]; Penicillins; and Ciprofloxacin hcl    Social History:  The patient  reports that she has quit smoking. She has never used smokeless tobacco. She reports that she does not drink alcohol or use drugs.   Family History:  The patient's family history includes Breast cancer in her mother; Diabetes in her mother; Other in her father.    ROS:  General:no colds or fevers, no weight changes Skin:no rashes or ulcers HEENT:no blurred vision, no congestion CV:see HPI PUL:see HPI GI:no diarrhea constipation or melena, no indigestion GU:no hematuria, no dysuria MS:no  joint pain, no claudication Neuro:no syncope, no lightheadedness Endo:no diabetes, + thyroid disease  Wt Readings from Last 3 Encounters:  10/17/18 164 lb (74.4 kg)  07/04/18 165 lb 3.2 oz (74.9 kg)  04/16/18 166 lb 12.8 oz (75.7 kg)     PHYSICAL EXAM: VS:  BP 118/68   Pulse (!) 59   Ht 5\' 5"  (1.651 m)   Wt 164 lb (74.4 kg)   SpO2 98%   BMI 27.29 kg/m  , BMI Body mass index is 27.29 kg/m. General:Pleasant affect, NAD Skin:Warm and dry, brisk capillary refill HEENT:normocephalic, sclera clear, mucus membranes moist Neck:supple, no JVD, no bruits  Heart:S1S2 RRR without murmur, gallup, rub or click Lungs:clear without rales, rhonchi, or wheezes QVZ:DGLO, non tender, + BS, do not palpate liver spleen or masses Ext:no lower ext edema, 2+ pedal pulses, 2+ radial pulses Neuro:alert and oriented X 3, MAE, follows commands, + facial symmetry    EKG:  EKG is ordered today. The ekg ordered today demonstrates SB at 59 no changes from previous .    Recent Labs: No results found for requested labs within last 8760 hours.    Lipid Panel    Component Value Date/Time   CHOL 112 06/09/2017 0400   TRIG 99 06/09/2017 0400   HDL 40 (L) 06/09/2017 0400   CHOLHDL 2.8 06/09/2017 0400   VLDL 20 06/09/2017 0400   LDLCALC 52 06/09/2017 0400       Other studies Reviewed: Additional studies/ records that were reviewed today include: Echo as above.   ASSESSMENT AND PLAN:  1.  PAF maintaining SR.  No recurrent chest pain.  No SOB. Change cardizem to 120 mg daily from 60 mg BID.  Follow up with Dr.Smith in 2 months.  2.  anticoagulation on Eliquis  , asa stopped.   3.  CAD total occ of RCA and LAD stent 1 year ago. , troponin mildly elevated felt due to rapid a fib.  No further chest pain.  No changes on echo.  4.  HTN controlled  5.  HLD continue statin.   Current medicines are reviewed with the patient today.  The patient Has no concerns regarding medicines.  The following  changes have been made:  See above Labs/ tests ordered today include:see above  Disposition:   FU:  see above  Signed, Cecilie Kicks, NP  10/18/2018 9:28 PM    [facility] [place], [place], [address] [place], [place] Phone: [phone]; Fax: [phone]

## 2018-10-17 ENCOUNTER — Ambulatory Visit (INDEPENDENT_AMBULATORY_CARE_PROVIDER_SITE_OTHER): Payer: Medicare Other | Admitting: Cardiology

## 2018-10-17 ENCOUNTER — Encounter: Payer: Self-pay | Admitting: Cardiology

## 2018-10-17 VITALS — BP 118/68 | HR 59 | Ht 65.0 in | Wt 164.0 lb

## 2018-10-17 DIAGNOSIS — I2510 Atherosclerotic heart disease of native coronary artery without angina pectoris: Secondary | ICD-10-CM | POA: Diagnosis not present

## 2018-10-17 DIAGNOSIS — I10 Essential (primary) hypertension: Secondary | ICD-10-CM | POA: Diagnosis not present

## 2018-10-17 DIAGNOSIS — I4891 Unspecified atrial fibrillation: Secondary | ICD-10-CM

## 2018-10-17 DIAGNOSIS — E782 Mixed hyperlipidemia: Secondary | ICD-10-CM

## 2018-10-17 DIAGNOSIS — Z7901 Long term (current) use of anticoagulants: Secondary | ICD-10-CM

## 2018-10-17 MED ORDER — APIXABAN 5 MG PO TABS: 5.0000 mg | ORAL_TABLET | Freq: Two times a day (BID) | ORAL | 11 refills | Status: AC

## 2018-10-17 MED ORDER — DILTIAZEM HCL ER COATED BEADS 120 MG PO CP24: 120.0000 mg | ORAL_CAPSULE | Freq: Every day | ORAL | 3 refills | Status: DC

## 2018-10-17 NOTE — Patient Instructions (Signed)
Your physician has recommended you make the following change in your medication: STOP CARDIZEM 60 MG  START CARDIZEM CD 120 MG EVERY DAY   MAY TAKE PLAIN [place]  Your physician recommends that you schedule a follow-up appointment in:  [place] Tamala Julian

## 2018-10-23 DIAGNOSIS — Z961 Presence of intraocular lens: Secondary | ICD-10-CM | POA: Diagnosis not present

## 2018-10-23 DIAGNOSIS — H04123 Dry eye syndrome of bilateral lacrimal glands: Secondary | ICD-10-CM | POA: Diagnosis not present

## 2018-10-23 DIAGNOSIS — H353131 Nonexudative age-related macular degeneration, bilateral, early dry stage: Secondary | ICD-10-CM | POA: Diagnosis not present

## 2018-10-23 DIAGNOSIS — H43813 Vitreous degeneration, bilateral: Secondary | ICD-10-CM | POA: Diagnosis not present

## 2018-10-28 DIAGNOSIS — B029 Zoster without complications: Secondary | ICD-10-CM | POA: Diagnosis not present

## 2018-10-28 DIAGNOSIS — J018 Other acute sinusitis: Secondary | ICD-10-CM | POA: Diagnosis not present

## 2018-11-24 DIAGNOSIS — M810 Age-related osteoporosis without current pathological fracture: Secondary | ICD-10-CM | POA: Diagnosis not present

## 2018-12-16 DIAGNOSIS — R202 Paresthesia of skin: Secondary | ICD-10-CM | POA: Diagnosis not present

## 2019-01-09 ENCOUNTER — Ambulatory Visit: Payer: Medicare Other | Admitting: Interventional Cardiology

## 2019-01-26 DIAGNOSIS — R600 Localized edema: Secondary | ICD-10-CM | POA: Diagnosis not present

## 2019-01-26 DIAGNOSIS — L03115 Cellulitis of right lower limb: Secondary | ICD-10-CM | POA: Diagnosis not present

## 2019-02-24 DIAGNOSIS — M25512 Pain in left shoulder: Secondary | ICD-10-CM | POA: Diagnosis not present

## 2019-02-24 DIAGNOSIS — R600 Localized edema: Secondary | ICD-10-CM | POA: Diagnosis not present

## 2019-03-30 ENCOUNTER — Other Ambulatory Visit: Payer: Self-pay

## 2019-03-30 ENCOUNTER — Encounter: Payer: Self-pay | Admitting: Podiatry

## 2019-03-30 ENCOUNTER — Ambulatory Visit (INDEPENDENT_AMBULATORY_CARE_PROVIDER_SITE_OTHER): Payer: Medicare Other | Admitting: Podiatry

## 2019-03-30 VITALS — BP 143/67 | HR 69 | Temp 97.0°F | Resp 16

## 2019-03-30 DIAGNOSIS — D689 Coagulation defect, unspecified: Secondary | ICD-10-CM | POA: Diagnosis not present

## 2019-03-30 DIAGNOSIS — B351 Tinea unguium: Secondary | ICD-10-CM | POA: Diagnosis not present

## 2019-03-31 DIAGNOSIS — Z0000 Encounter for general adult medical examination without abnormal findings: Secondary | ICD-10-CM | POA: Diagnosis not present

## 2019-03-31 DIAGNOSIS — M810 Age-related osteoporosis without current pathological fracture: Secondary | ICD-10-CM | POA: Diagnosis not present

## 2019-03-31 DIAGNOSIS — N3001 Acute cystitis with hematuria: Secondary | ICD-10-CM | POA: Diagnosis not present

## 2019-03-31 DIAGNOSIS — Z6827 Body mass index (BMI) 27.0-27.9, adult: Secondary | ICD-10-CM | POA: Diagnosis not present

## 2019-03-31 DIAGNOSIS — E034 Atrophy of thyroid (acquired): Secondary | ICD-10-CM | POA: Diagnosis not present

## 2019-03-31 DIAGNOSIS — I10 Essential (primary) hypertension: Secondary | ICD-10-CM | POA: Diagnosis not present

## 2019-04-06 ENCOUNTER — Other Ambulatory Visit: Payer: Self-pay | Admitting: Physician Assistant

## 2019-04-06 NOTE — Progress Notes (Signed)
Subjective:  Patient ID: Tonya Cruz, female    DOB: Apr 20, 1924,  MRN: [id number]  Chief Complaint  Patient presents with  . debride    routine nail care -pt denies N/V/F?Ch    85 y.o. female presents with the above complaint.  Reports elongated nails to both feet. On eliquis.  Review of Systems: Negative except as noted in the HPI. Denies N/V/F/Ch.  Past Medical History:  Diagnosis Date  . Anxiety   . Breast cancer ([place])    right side  . Coronary artery disease    06/11/17 PTCA/DES x1 to mLAD, CTO mRCA with col.   [name] GERD (gastroesophageal reflux disease)   . High cholesterol   . Hypertension   . NSTEMI (non-ST elevated myocardial infarction) ([place]) 06/11/2017  . Thyroid disease     Current Outpatient Medications:  .  apixaban (ELIQUIS) 5 MG TABS tablet, Take 1 tablet (5 mg total) by mouth 2 (two) times daily., Disp: 60 tablet, Rfl: 11 .  atorvastatin (LIPITOR) 40 MG tablet, Take 40 mg by mouth daily., Disp: , Rfl:  .  buPROPion (WELLBUTRIN) 75 MG tablet, Take 75 mg by mouth at bedtime., Disp: , Rfl: 0 .  carvedilol (COREG) 6.25 MG tablet, Take 1 tablet (6.25 mg total) by mouth 2 (two) times daily., Disp: 60 tablet, Rfl: 6 .  diltiazem (CARDIZEM CD) 120 MG 24 hr capsule, Take 1 capsule (120 mg total) by mouth daily., Disp: 90 capsule, Rfl: 3 .  levothyroxine (SYNTHROID, LEVOTHROID) 100 MCG tablet, Take 100 mcg by mouth every morning. , Disp: , Rfl: 0 .  LORazepam (ATIVAN) 1 MG tablet, Take half (1/2) tablet by mouth daily at bedtime as needed for anxiety or sleep., Disp: , Rfl: 0 .  pantoprazole (PROTONIX) 40 MG tablet, Take 40 mg by mouth every morning., Disp: , Rfl:  .  pregabalin (LYRICA) 50 MG capsule, Take 50 mg by mouth 3 (three) times daily., Disp: , Rfl:  .  ranitidine (ZANTAC) 150 MG tablet, Take 150 mg by mouth every evening., Disp: , Rfl:  .  isosorbide mononitrate (IMDUR) 60 MG 24 hr tablet, Take 1 tablet (60 mg total) by mouth daily., Disp: 90 tablet, Rfl: 3   Social History   Tobacco Use  Smoking Status Former Smoker  Smokeless Tobacco Never Used    Allergies  Allergen Reactions  . Celexa [Citalopram Hydrobromide] Other (See Comments)    Low sodium  . Penicillins     Has patient had a PCN reaction causing immediate rash, facial/tongue/throat swelling, SOB or lightheadedness with hypotension: No Has patient had a PCN reaction causing severe rash involving mucus membranes or skin necrosis: No Has patient had a PCN reaction that required hospitalization: No Has patient had a PCN reaction occurring within the last 10 years: No If all of the above answers are "NO", then may proceed with Cephalosporin use.  . Ciprofloxacin Hcl Rash   Objective:   Vitals:   03/30/19 0946  BP: (!) 143/67  Pulse: 69  Resp: 16  Temp: (!) 97 F (36.1 C)   There is no height or weight on file to calculate BMI. Constitutional Well developed. Well nourished.  Vascular Dorsalis pedis pulses palpable bilaterally. Posterior tibial pulses palpable bilaterally. Capillary refill normal to all digits.  No cyanosis or clubbing noted. Pedal hair growth normal.  Neurologic Normal speech. Oriented to person, place, and time. Epicritic sensation to light touch grossly present bilaterally.  Dermatologic Nails elongated dystrophic pain to palpation No open wounds.  No skin lesions.  Orthopedic: Normal joint ROM without pain or crepitus bilaterally. No visible deformities. No bony tenderness.   Radiographs: None Assessment:   1. Onychomycosis   2. Coagulation defect [facility])    Plan:  Patient was evaluated and treated and all questions answered.  Onychomycosis with coagulation defect. -Nails palliatively debridement as below -Educated on self-care  Procedure: Nail Debridement Rationale: Pain Type of Debridement: manual, sharp debridement. Instrumentation: Nail nipper, rotary burr. Number of Nails: 10  Return in about 9 weeks (around 06/01/2019) for  Routine Foot Care on Eliquis.

## 2019-04-07 ENCOUNTER — Other Ambulatory Visit: Payer: Self-pay | Admitting: Physician Assistant

## 2019-04-14 DIAGNOSIS — R799 Abnormal finding of blood chemistry, unspecified: Secondary | ICD-10-CM | POA: Diagnosis not present

## 2019-04-14 DIAGNOSIS — E038 Other specified hypothyroidism: Secondary | ICD-10-CM | POA: Diagnosis not present

## 2019-04-21 DIAGNOSIS — E875 Hyperkalemia: Secondary | ICD-10-CM | POA: Diagnosis not present

## 2019-04-21 DIAGNOSIS — R799 Abnormal finding of blood chemistry, unspecified: Secondary | ICD-10-CM | POA: Diagnosis not present

## 2019-04-21 DIAGNOSIS — E034 Atrophy of thyroid (acquired): Secondary | ICD-10-CM | POA: Diagnosis not present

## 2019-05-12 DIAGNOSIS — B0239 Other herpes zoster eye disease: Secondary | ICD-10-CM | POA: Diagnosis not present

## 2019-05-20 DIAGNOSIS — N390 Urinary tract infection, site not specified: Secondary | ICD-10-CM | POA: Diagnosis not present

## 2019-05-20 DIAGNOSIS — N952 Postmenopausal atrophic vaginitis: Secondary | ICD-10-CM | POA: Diagnosis not present

## 2019-05-20 DIAGNOSIS — Z79899 Other long term (current) drug therapy: Secondary | ICD-10-CM | POA: Diagnosis not present

## 2019-05-20 DIAGNOSIS — N3946 Mixed incontinence: Secondary | ICD-10-CM | POA: Diagnosis not present

## 2019-06-01 DIAGNOSIS — R5383 Other fatigue: Secondary | ICD-10-CM | POA: Diagnosis not present

## 2019-06-01 DIAGNOSIS — R799 Abnormal finding of blood chemistry, unspecified: Secondary | ICD-10-CM | POA: Diagnosis not present

## 2019-06-01 DIAGNOSIS — M810 Age-related osteoporosis without current pathological fracture: Secondary | ICD-10-CM | POA: Diagnosis not present

## 2019-06-01 DIAGNOSIS — E034 Atrophy of thyroid (acquired): Secondary | ICD-10-CM | POA: Diagnosis not present

## 2019-07-08 NOTE — Progress Notes (Signed)
Cardiology Office Note:    Date:  07/10/2019   ID:  Tonya Cruz, DOB 04-14-1924, MRN [id number]  PCP:  Darrol Jump, PA-C  Cardiologist:  Sinclair Grooms, MD   Referring MD: Darrol Jump, PA-C   Chief Complaint  Patient presents with  . Atrial Fibrillation  . Coronary Artery Disease    History of Present Illness:    Tonya Cruz is a 85 y.o. female with a hx of breast cancer, hypertension, hyperlipidemia, non-ST elevation MI in September 2018, noted to have chronically occluded RCA, and had stenting of the mid LAD using DES.   Had an episode of A. fib in December 2019.  Overall, Tonya Cruz has no complaints.  She wants to be sure that we give her a flu shot.  I was unaware but she did have atrial fibrillation in late 2019 and was started on Eliquis.  She has had no bleeding or complications.  Her symptom was that of racing heart.  There was mild chest discomfort.  She was managed at [facility].  Do not have hospital records.  Cannot see that an echocardiogram was done.  She is on diltiazem.  She has had no prolonged episodes of atrial fibrillation since December 2019.  She denies orthopnea, PND, lower extremity edema, blood in the urine and stool, and neurological events.  Past Medical History:  Diagnosis Date  . Anxiety   . Breast cancer ([place])    right side  . Coronary artery disease    06/11/17 PTCA/DES x1 to mLAD, CTO mRCA with col.   [name] GERD (gastroesophageal reflux disease)   . High cholesterol   . Hypertension   . NSTEMI (non-ST elevated myocardial infarction) ([place]) 06/11/2017  . Thyroid disease     Past Surgical History:  Procedure Laterality Date  . BREAST SURGERY    . CARDIAC CATHETERIZATION  06/11/2017  . CORONARY STENT INTERVENTION N/A 06/11/2017   Procedure: CORONARY STENT INTERVENTION;  Surgeon: Burnell Blanks, MD;  Location: [place] [facility];  Service: Cardiovascular;  Laterality: N/A;  . LEFT HEART CATH AND CORONARY  ANGIOGRAPHY N/A 06/11/2017   Procedure: LEFT HEART CATH AND CORONARY ANGIOGRAPHY;  Surgeon: Burnell Blanks, MD;  Location: [place] [facility];  Service: Cardiovascular;  Laterality: N/A;  . roto cuff      Current Medications: Current Meds  Medication Sig  . apixaban (ELIQUIS) 5 MG TABS tablet Take 1 tablet (5 mg total) by mouth 2 (two) times daily.  [name] atorvastatin (LIPITOR) 40 MG tablet TAKE 1 TABLET(40 MG) BY MOUTH DAILY  . buPROPion (WELLBUTRIN) 75 MG tablet Take 75 mg by mouth at bedtime.  . carvedilol (COREG) 3.125 MG tablet Take 3.125 mg by mouth 2 (two) times daily.  [name] diltiazem (CARDIZEM CD) 120 MG 24 hr capsule Take 1 capsule (120 mg total) by mouth daily.  [name] diltiazem (CARDIZEM) 60 MG tablet Take 60 mg by mouth 2 (two) times daily.  . isosorbide mononitrate (IMDUR) 60 MG 24 hr tablet TAKE 1 TABLET BY MOUTH DAILY  . levothyroxine (SYNTHROID) 88 MCG tablet Take 88 mcg by mouth daily.  [name] LORazepam (ATIVAN) 1 MG tablet Take half (1/2) tablet by mouth daily at bedtime as needed for anxiety or sleep.  . pantoprazole (PROTONIX) 40 MG tablet Take 40 mg by mouth every morning.  . pregabalin (LYRICA) 50 MG capsule Take 50 mg by mouth 2 (two) times daily.      Allergies:   Celexa [citalopram hydrobromide], Penicillins,  and Ciprofloxacin hcl   Social History   Socioeconomic History  . Marital status: Widowed    Spouse name: Not on file  . Number of children: Not on file  . Years of education: Not on file  . Highest education level: Not on file  Occupational History  . Not on file  Social Needs  . Financial resource strain: Not on file  . Food insecurity    Worry: Not on file    Inability: Not on file  . Transportation needs    Medical: Not on file    Non-medical: Not on file  Tobacco Use  . Smoking status: Former Research scientist (life sciences)  . Smokeless tobacco: Never Used  Substance and Sexual Activity  . Alcohol use: No  . Drug use: No  . Sexual activity: Not Currently  Lifestyle  .  Physical activity    Days per week: Not on file    Minutes per session: Not on file  . Stress: Not on file  Relationships  . Social Herbalist on phone: Not on file    Gets together: Not on file    Attends religious service: Not on file    Active member of club or organization: Not on file    Attends meetings of clubs or organizations: Not on file    Relationship status: Not on file  Other Topics Concern  . Not on file  Social History Narrative  . Not on file     Family History: The patient's family history includes Breast cancer in her mother; Diabetes in her mother; Other in her father.  ROS:   Please see the history of present illness.    She has been stable without fever or chills.  She has been observing the 3W's.  She had recent recurrence of shingles and a UTI.  She is slowly improving.  All other systems reviewed and are negative.  EKGs/Labs/Other Studies Reviewed:    The following studies were reviewed today: Recent laboratory data include a creatinine of 1.08 and a hemoglobin of 14 done in June 2020.  EKG:  EKG sinus bradycardia at 60 bpm.  Nonspecific ST abnormality.  Otherwise normal in appearance.  When compared to prior tracing in January 2020, no changes occurred.  Recent Labs: No results found for requested labs within last 8760 hours.  Recent Lipid Panel    Component Value Date/Time   CHOL 112 06/09/2017 0400   TRIG 99 06/09/2017 0400   HDL 40 (L) 06/09/2017 0400   CHOLHDL 2.8 06/09/2017 0400   VLDL 20 06/09/2017 0400   LDLCALC 52 06/09/2017 0400    Physical Exam:    VS:  BP (!) 142/54   Pulse 60   Ht 5\' 5"  (1.651 m)   Wt 167 lb 9.6 oz (76 kg)   SpO2 97%   BMI 27.89 kg/m     Wt Readings from Last 3 Encounters:  07/10/19 167 lb 9.6 oz (76 kg)  10/17/18 164 lb (74.4 kg)  07/04/18 165 lb 3.2 oz (74.9 kg)     GEN: Compatible with age. No acute distress HEENT: Normal NECK: No JVD. LYMPHATICS: No lymphadenopathy CARDIAC:  RRR  without murmur, gallop, or edema. VASCULAR:  Normal Pulses. No bruits. RESPIRATORY:  Clear to auscultation without rales, wheezing or rhonchi  ABDOMEN: Soft, non-tender, non-distended, No pulsatile mass, MUSCULOSKELETAL: No deformity  SKIN: Warm and dry NEUROLOGIC:  Alert and oriented x 3 PSYCHIATRIC:  Normal affect   ASSESSMENT:    1.  Coronary artery disease of native artery of native heart with stable angina pectoris ([place])   2. Atrial fibrillation, unspecified type ([place])   3. Mixed hyperlipidemia   4. Essential hypertension   5. Anticoagulation adequate   6. Educated about COVID-19 virus infection    PLAN:    In order of problems listed above:  1. She is doing well.  She denies angina.  Secondary prevention is discussed. 2. PAF occurred in December.  No significant clinical recurrence since that time.  Now on Eliquis and stable.  [facility] is greater than 3. 3. LDL target less than 70 is being achieved. 4. Blood pressure target 140/80 mmHg or less given her age. 5. Continue Eliquis.  Hemoglobin and kidney function needs to be done twice a year. 6. Social distancing, handwashing, and mask wearing is advocated.  Double strength flu vaccine is given.  Overall education and awareness concerning primary/secondary risk prevention was discussed in detail: LDL less than 70, hemoglobin A1c less than 7, blood pressure target less than 130/80 mmHg, >150 minutes of moderate aerobic activity per week, avoidance of smoking, weight control (via diet and exercise), and continued surveillance/management of/for obstructive sleep apnea.    Medication Adjustments/Labs and Tests Ordered: Current medicines are reviewed at length with the patient today.  Concerns regarding medicines are outlined above.  Orders Placed This Encounter  Procedures  . EKG 12-Lead   No orders of the defined types were placed in this encounter.   Patient Instructions  Medication Instructions:  Your physician  recommends that you continue on your current medications as directed. Please refer to the Current Medication list given to you today.  If you need a refill on your cardiac medications before your next appointment, please call your pharmacy.   Lab work: None If you have labs (blood work) drawn today and your tests are completely normal, you will receive your results only by: [name] MyChart Message (if you have MyChart) OR . A paper copy in the mail If you have any lab test that is abnormal or we need to change your treatment, we will call you to review the results.  Testing/Procedures: None  Follow-Up:  Your physician wants you to follow-up in: 6 months with Cecilie Kicks, NP.  You will receive a reminder letter in the mail two months in advance. If you don't receive a letter, please call our office to schedule the follow-up appointment.  At [facility], you and your health needs are our priority.  As part of our continuing mission to provide you with exceptional heart care, we have created designated Provider Care Teams.  These Care Teams include your primary Cardiologist (physician) and Advanced Practice Providers (APPs -  Physician Assistants and Nurse Practitioners) who all work together to provide you with the care you need, when you need it. You will need a follow up appointment in 12 months.  Please call our office 2 months in advance to schedule this appointment.  You may see Sinclair Grooms, MD or one of the following Advanced Practice Providers on your designated Care Team:   Truitt Merle, NP Cecilie Kicks, NP . Kathyrn Drown, NP  Any Other Special Instructions Will Be Listed Below (If Applicable).       Signed, Sinclair Grooms, MD  07/10/2019 9:39 AM    [facility]

## 2019-07-10 ENCOUNTER — Encounter: Payer: Self-pay | Admitting: Interventional Cardiology

## 2019-07-10 ENCOUNTER — Other Ambulatory Visit: Payer: Self-pay

## 2019-07-10 ENCOUNTER — Ambulatory Visit (INDEPENDENT_AMBULATORY_CARE_PROVIDER_SITE_OTHER): Payer: Medicare Other | Admitting: Interventional Cardiology

## 2019-07-10 VITALS — BP 142/54 | HR 60 | Ht 65.0 in | Wt 167.6 lb

## 2019-07-10 DIAGNOSIS — I25118 Atherosclerotic heart disease of native coronary artery with other forms of angina pectoris: Secondary | ICD-10-CM

## 2019-07-10 DIAGNOSIS — I480 Paroxysmal atrial fibrillation: Secondary | ICD-10-CM | POA: Diagnosis not present

## 2019-07-10 DIAGNOSIS — I10 Essential (primary) hypertension: Secondary | ICD-10-CM | POA: Diagnosis not present

## 2019-07-10 DIAGNOSIS — E782 Mixed hyperlipidemia: Secondary | ICD-10-CM | POA: Diagnosis not present

## 2019-07-10 DIAGNOSIS — I2510 Atherosclerotic heart disease of native coronary artery without angina pectoris: Secondary | ICD-10-CM

## 2019-07-10 DIAGNOSIS — Z7189 Other specified counseling: Secondary | ICD-10-CM | POA: Diagnosis not present

## 2019-07-10 DIAGNOSIS — Z7901 Long term (current) use of anticoagulants: Secondary | ICD-10-CM | POA: Diagnosis not present

## 2019-07-10 DIAGNOSIS — Z23 Encounter for immunization: Secondary | ICD-10-CM | POA: Diagnosis not present

## 2019-07-10 NOTE — Patient Instructions (Signed)
Medication Instructions:  Your physician recommends that you continue on your current medications as directed. Please refer to the Current Medication list given to you today.  If you need a refill on your cardiac medications before your next appointment, please call your pharmacy.   Lab work: None If you have labs (blood work) drawn today and your tests are completely normal, you will receive your results only by: [name] MyChart Message (if you have MyChart) OR . A paper copy in the mail If you have any lab test that is abnormal or we need to change your treatment, we will call you to review the results.  Testing/Procedures: None  Follow-Up:  Your physician wants you to follow-up in: 6 months with Cecilie Kicks, NP.  You will receive a reminder letter in the mail two months in advance. If you don't receive a letter, please call our office to schedule the follow-up appointment.  At [facility], you and your health needs are our priority.  As part of our continuing mission to provide you with exceptional heart care, we have created designated Provider Care Teams.  These Care Teams include your primary Cardiologist (physician) and Advanced Practice Providers (APPs -  Physician Assistants and Nurse Practitioners) who all work together to provide you with the care you need, when you need it. You will need a follow up appointment in 12 months.  Please call our office 2 months in advance to schedule this appointment.  You may see Sinclair Grooms, MD or one of the following Advanced Practice Providers on your designated Care Team:   Truitt Merle, NP Cecilie Kicks, NP . Kathyrn Drown, NP  Any Other Special Instructions Will Be Listed Below (If Applicable).

## 2019-07-17 IMAGING — DX DG CHEST 2V
2 series · 2 of 2 positions shown · non-contrast
Comparison: February 23, 2014

CLINICAL DATA: Chest pain and shortness of breath for 2 weeks

EXAM:
CHEST  2 VIEW

[chest lat]
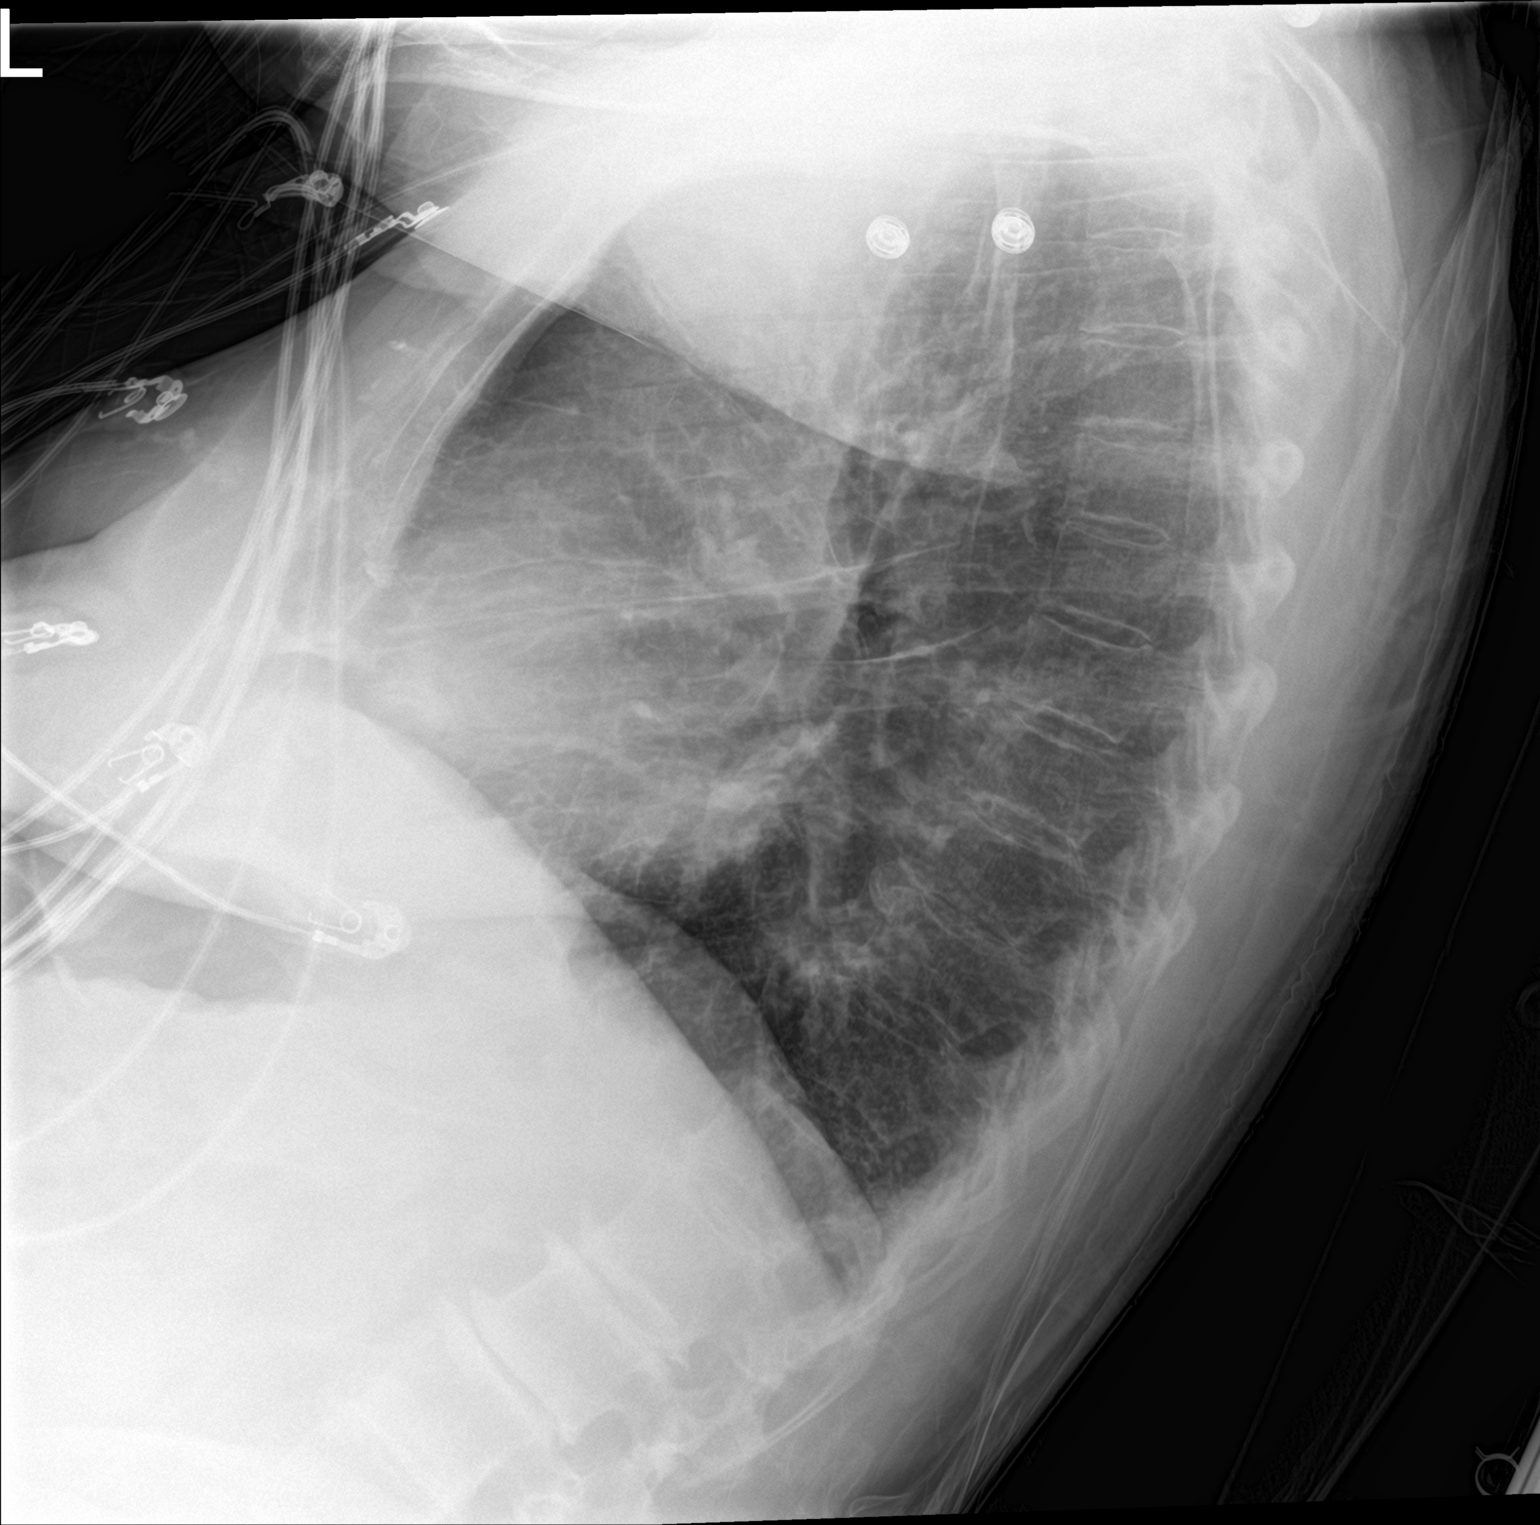

[chest ap]
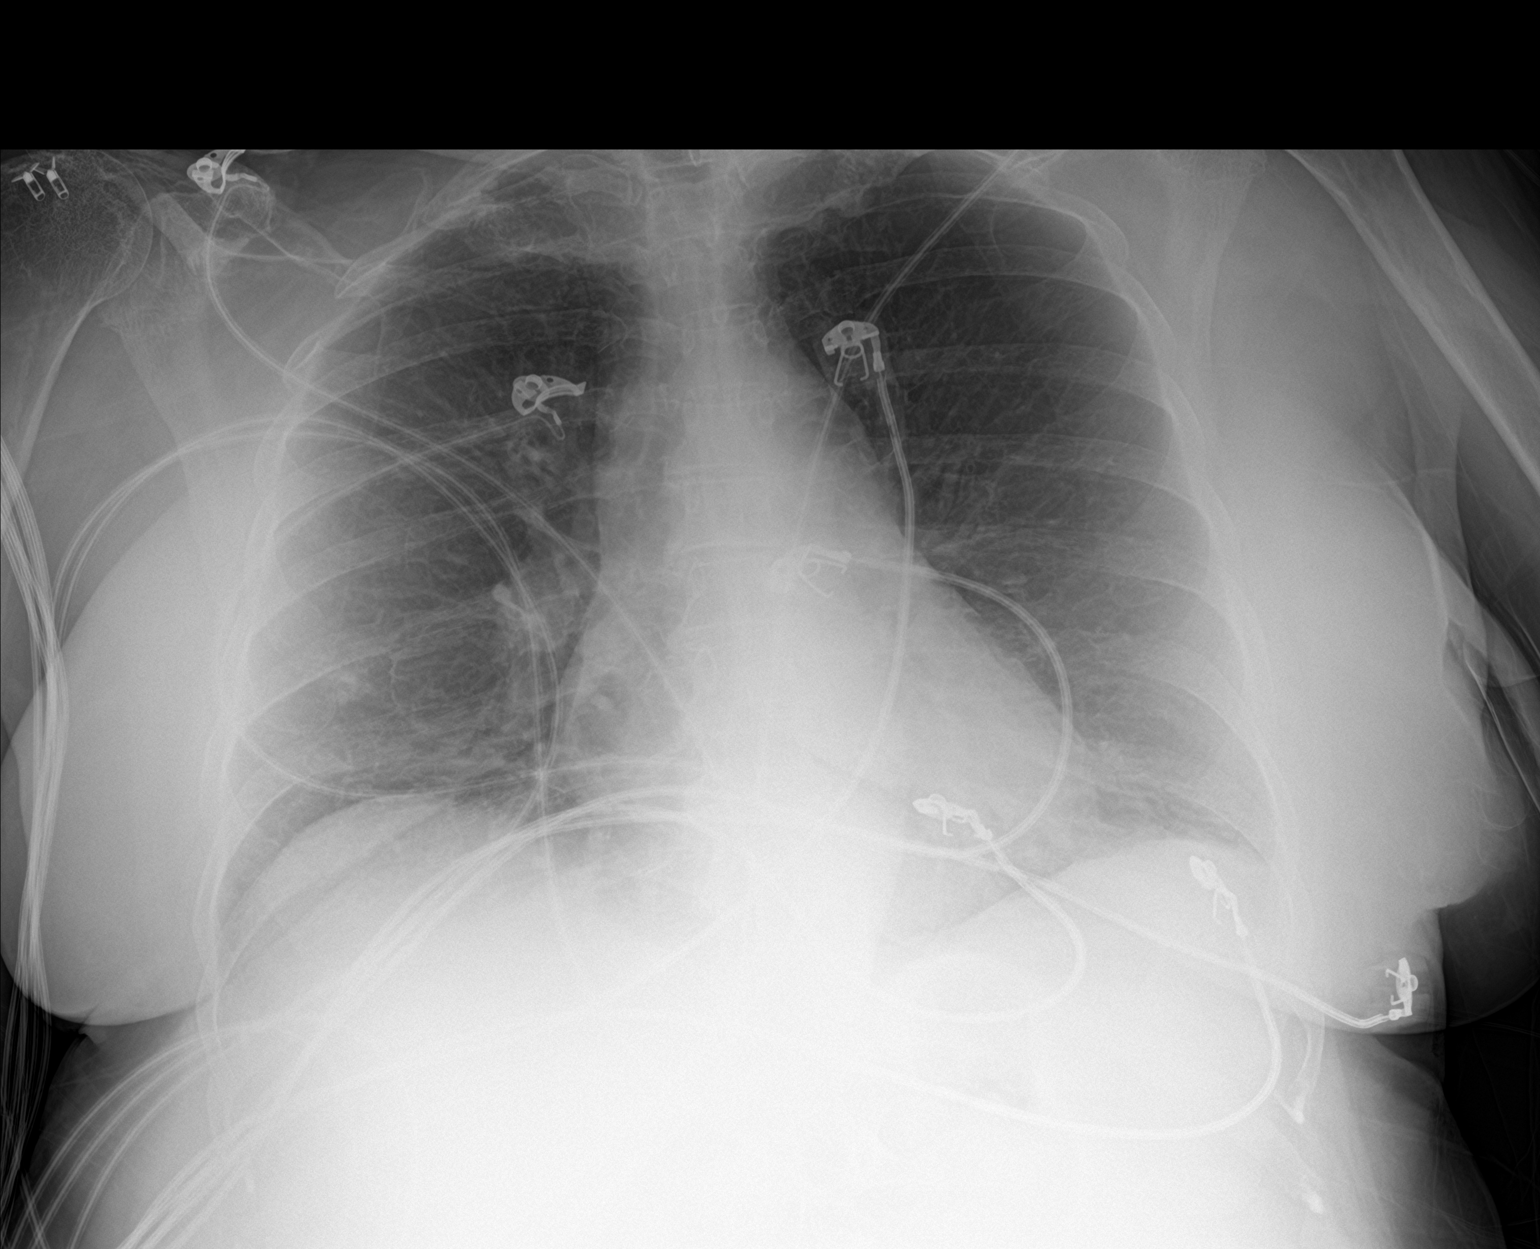

[2 of 2 positions shown; findings below may reference images not displayed]

FINDINGS: The heart size and mediastinal contours are within normal limits.
There is no focal infiltrate, pulmonary edema, or pleural effusion.
The visualized skeletal structures are stable.
IMPRESSION: No active cardiopulmonary disease.

## 2019-08-14 IMAGING — DX DG CHEST 2V
2 series · 2 of 2 positions shown · non-contrast
Comparison: 06/08/2017

CLINICAL DATA: Patient complains of chest pains across both sides
of her chest. And a burning sensation across the top of her chest.
Symptoms come off and on since yesterday. No SOB. CHF. Some sinus
drainage, no cough or congestion.

EXAM:
CHEST  2 VIEW

[chest lat]
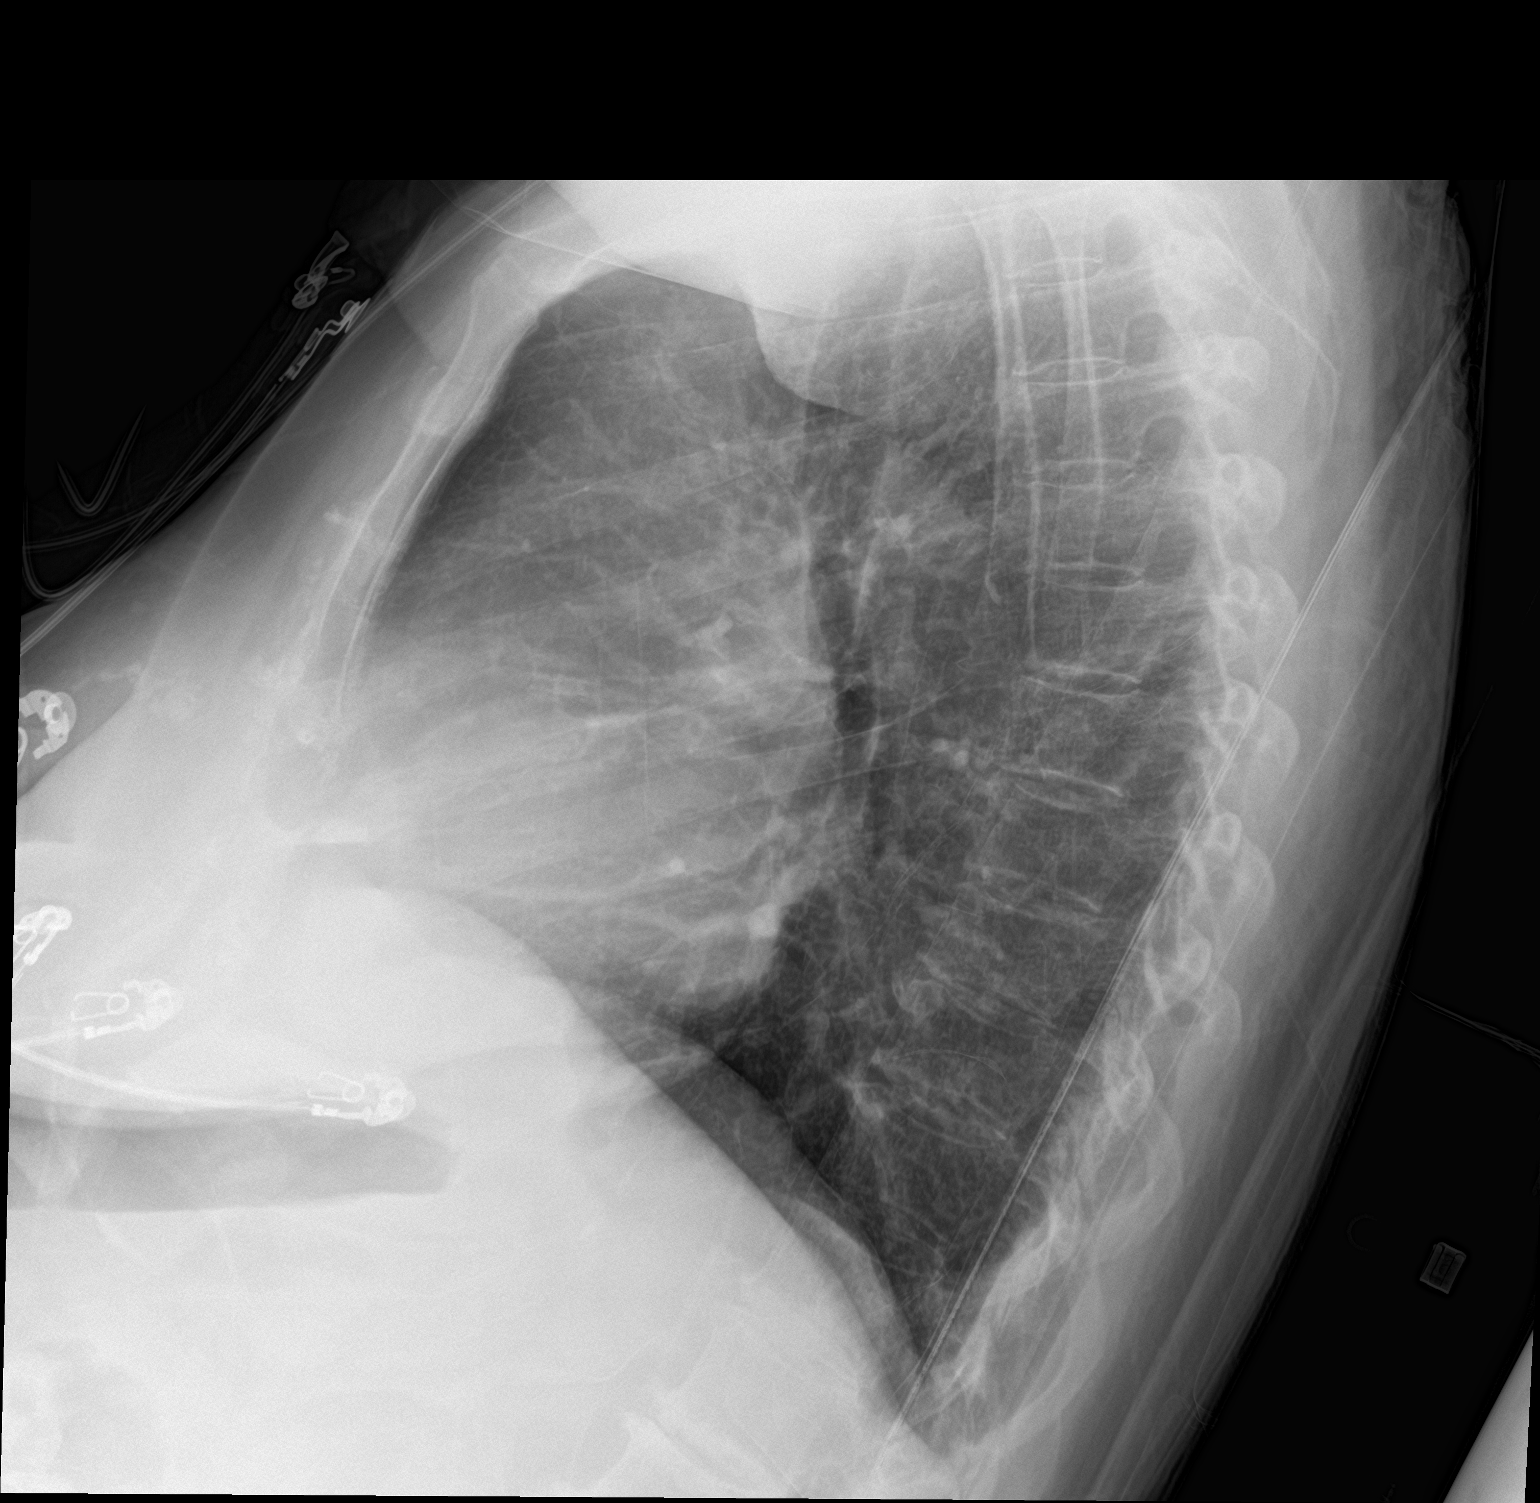

[chest ap]
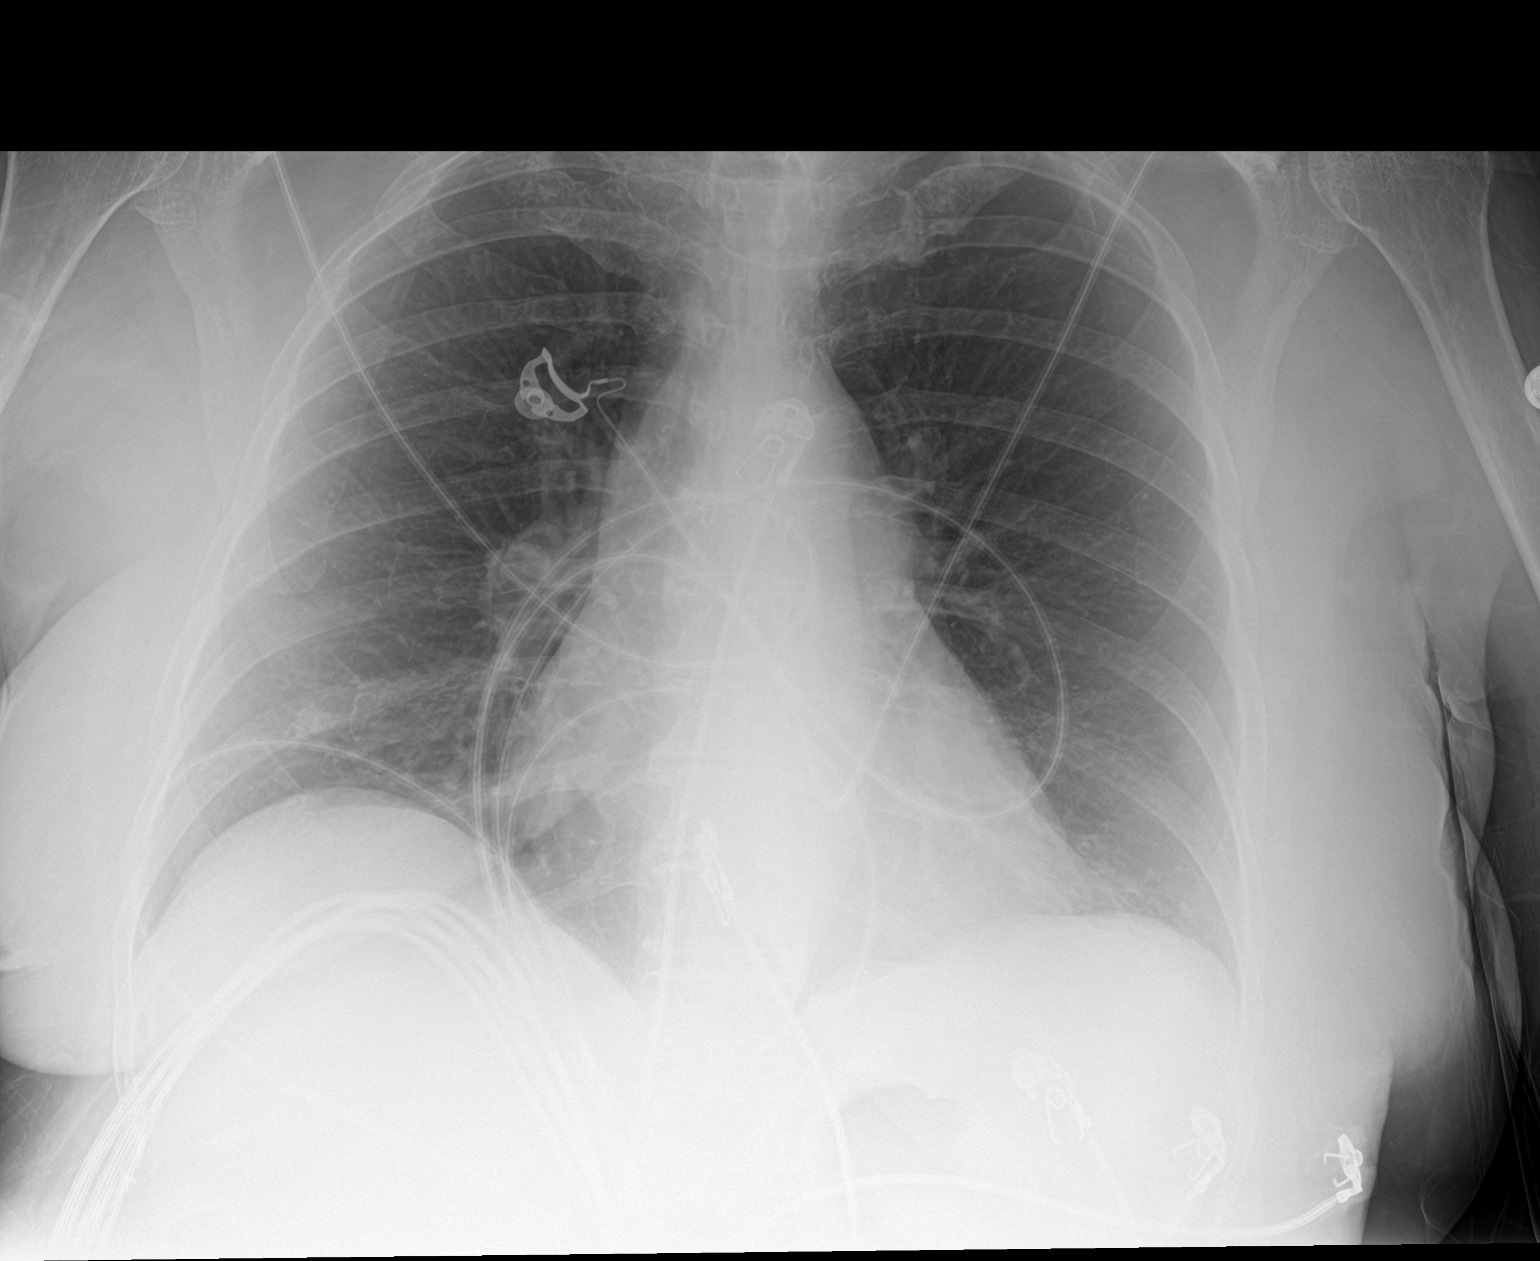

[2 of 2 positions shown; findings below may reference images not displayed]

FINDINGS: Heart size is normal. The lungs are free of focal consolidations and
pleural effusions. No pulmonary edema. Coarse calcification is again
identified in medial aspect of the right breast overlying the right
lung base. No suspicious pulmonary nodules.
IMPRESSION: No evidence for acute cardiopulmonary abnormality.

## 2019-09-15 ENCOUNTER — Other Ambulatory Visit: Payer: Self-pay

## 2019-09-15 MED ORDER — ISOSORBIDE MONONITRATE ER 60 MG PO TB24: 60.0000 mg | ORAL_TABLET | Freq: Every day | ORAL | 3 refills | Status: DC

## 2019-10-08 DIAGNOSIS — E785 Hyperlipidemia, unspecified: Secondary | ICD-10-CM

## 2019-10-08 DIAGNOSIS — I480 Paroxysmal atrial fibrillation: Secondary | ICD-10-CM

## 2019-10-08 DIAGNOSIS — I10 Essential (primary) hypertension: Secondary | ICD-10-CM

## 2019-10-08 DIAGNOSIS — I2510 Atherosclerotic heart disease of native coronary artery without angina pectoris: Secondary | ICD-10-CM

## 2019-10-08 DIAGNOSIS — R079 Chest pain, unspecified: Secondary | ICD-10-CM

## 2019-10-08 DIAGNOSIS — E039 Hypothyroidism, unspecified: Secondary | ICD-10-CM

## 2019-10-09 DIAGNOSIS — E039 Hypothyroidism, unspecified: Secondary | ICD-10-CM | POA: Diagnosis not present

## 2019-10-09 DIAGNOSIS — I25119 Atherosclerotic heart disease of native coronary artery with unspecified angina pectoris: Secondary | ICD-10-CM | POA: Diagnosis not present

## 2019-10-09 DIAGNOSIS — E785 Hyperlipidemia, unspecified: Secondary | ICD-10-CM | POA: Diagnosis not present

## 2019-10-09 DIAGNOSIS — R079 Chest pain, unspecified: Secondary | ICD-10-CM | POA: Diagnosis not present

## 2019-10-09 DIAGNOSIS — I160 Hypertensive urgency: Secondary | ICD-10-CM | POA: Diagnosis not present

## 2019-10-09 DIAGNOSIS — I361 Nonrheumatic tricuspid (valve) insufficiency: Secondary | ICD-10-CM | POA: Diagnosis not present

## 2019-10-09 DIAGNOSIS — I10 Essential (primary) hypertension: Secondary | ICD-10-CM | POA: Diagnosis not present

## 2019-10-09 DIAGNOSIS — Z7901 Long term (current) use of anticoagulants: Secondary | ICD-10-CM | POA: Diagnosis not present

## 2019-10-09 DIAGNOSIS — I340 Nonrheumatic mitral (valve) insufficiency: Secondary | ICD-10-CM | POA: Diagnosis not present

## 2019-10-09 DIAGNOSIS — K219 Gastro-esophageal reflux disease without esophagitis: Secondary | ICD-10-CM | POA: Diagnosis not present

## 2019-10-09 DIAGNOSIS — I252 Old myocardial infarction: Secondary | ICD-10-CM | POA: Diagnosis not present

## 2019-10-09 DIAGNOSIS — J45909 Unspecified asthma, uncomplicated: Secondary | ICD-10-CM | POA: Diagnosis not present

## 2019-10-09 DIAGNOSIS — I2510 Atherosclerotic heart disease of native coronary artery without angina pectoris: Secondary | ICD-10-CM | POA: Diagnosis not present

## 2019-10-09 DIAGNOSIS — I480 Paroxysmal atrial fibrillation: Secondary | ICD-10-CM | POA: Diagnosis not present

## 2019-10-09 DIAGNOSIS — Z87891 Personal history of nicotine dependence: Secondary | ICD-10-CM | POA: Diagnosis not present

## 2019-10-12 ENCOUNTER — Ambulatory Visit (INDEPENDENT_AMBULATORY_CARE_PROVIDER_SITE_OTHER): Payer: Medicare Other | Admitting: Podiatry

## 2019-10-12 ENCOUNTER — Other Ambulatory Visit: Payer: Self-pay

## 2019-10-12 ENCOUNTER — Encounter: Payer: Self-pay | Admitting: Podiatry

## 2019-10-12 DIAGNOSIS — D689 Coagulation defect, unspecified: Secondary | ICD-10-CM

## 2019-10-12 DIAGNOSIS — B351 Tinea unguium: Secondary | ICD-10-CM | POA: Diagnosis not present

## 2019-10-12 NOTE — Progress Notes (Signed)
  Subjective:  Patient ID: Tonya Cruz, female    DOB: 1924-02-13,  MRN: [id number]  Chief Complaint  Patient presents with  . debride    ROutine foot care -pt on Eliquis    86 y.o. female presents with the above complaint. History confirmed with patient.  Objective:  Physical Exam: warm, good capillary refill, nail exam onychomycosis of the toenails, no trophic changes or ulcerative lesions, normal DP and PT pulses, and normal sensory exam. Left Foot: normal exam, no swelling, tenderness, instability; ligaments intact, full range of motion of all ankle/foot joints  Right Foot: normal exam, no swelling, tenderness, instability; ligaments intact, full range of motion of all ankle/foot joints   No images are attached to the encounter.  Assessment:  1. Onychomycosis   2. Coagulation defect [facility])    Plan:  Patient was evaluated and treated and all questions answered.  Onychomycosis and Coagulation Defect  -Nails debrided as below  Procedure: Nail Debridement Rationale: Patient meets criteria for routine foot care due to coag defect Type of Debridement: manual, sharp debridement. Instrumentation: Nail nipper, rotary burr. Number of Nails: 10  Return in about 3 months (around 01/10/2020) for routine foot care on [facility].

## 2019-11-16 ENCOUNTER — Other Ambulatory Visit: Payer: Self-pay

## 2019-11-16 MED ORDER — DILTIAZEM HCL ER COATED BEADS 120 MG PO CP24: 120.0000 mg | ORAL_CAPSULE | Freq: Every day | ORAL | 1 refills | Status: DC

## 2019-11-16 MED ORDER — CARVEDILOL 6.25 MG PO TABS: 6.2500 mg | ORAL_TABLET | Freq: Two times a day (BID) | ORAL | 1 refills | Status: DC

## 2019-11-16 MED ORDER — ATORVASTATIN CALCIUM 40 MG PO TABS: ORAL_TABLET | ORAL | 1 refills | Status: AC

## 2019-11-16 MED ORDER — LORAZEPAM 1 MG PO TABS: 1.0000 mg | ORAL_TABLET | Freq: Every day | ORAL | 0 refills | Status: DC

## 2019-11-17 ENCOUNTER — Other Ambulatory Visit: Payer: Self-pay

## 2019-11-17 MED ORDER — LORAZEPAM 1 MG PO TABS: 1.0000 mg | ORAL_TABLET | Freq: Every day | ORAL | 0 refills | Status: DC

## 2019-12-04 DIAGNOSIS — L820 Inflamed seborrheic keratosis: Secondary | ICD-10-CM | POA: Diagnosis not present

## 2019-12-04 DIAGNOSIS — L821 Other seborrheic keratosis: Secondary | ICD-10-CM | POA: Diagnosis not present

## 2019-12-04 DIAGNOSIS — L578 Other skin changes due to chronic exposure to nonionizing radiation: Secondary | ICD-10-CM | POA: Diagnosis not present

## 2019-12-14 ENCOUNTER — Other Ambulatory Visit: Payer: Self-pay

## 2019-12-14 MED ORDER — DILTIAZEM HCL 60 MG PO TABS: 60.0000 mg | ORAL_TABLET | Freq: Two times a day (BID) | ORAL | 0 refills | Status: DC

## 2019-12-14 MED ORDER — PANTOPRAZOLE SODIUM 40 MG PO TBEC: 40.0000 mg | DELAYED_RELEASE_TABLET | Freq: Every day | ORAL | 0 refills | Status: DC

## 2020-01-19 ENCOUNTER — Other Ambulatory Visit: Payer: Self-pay

## 2020-01-19 ENCOUNTER — Ambulatory Visit (INDEPENDENT_AMBULATORY_CARE_PROVIDER_SITE_OTHER): Payer: Medicare Other

## 2020-01-19 DIAGNOSIS — M810 Age-related osteoporosis without current pathological fracture: Secondary | ICD-10-CM

## 2020-01-19 DIAGNOSIS — M778 Other enthesopathies, not elsewhere classified: Secondary | ICD-10-CM | POA: Diagnosis not present

## 2020-01-19 DIAGNOSIS — L909 Atrophic disorder of skin, unspecified: Secondary | ICD-10-CM | POA: Insufficient documentation

## 2020-01-19 DIAGNOSIS — M216X2 Other acquired deformities of left foot: Secondary | ICD-10-CM | POA: Diagnosis not present

## 2020-01-19 DIAGNOSIS — M216X1 Other acquired deformities of right foot: Secondary | ICD-10-CM | POA: Diagnosis not present

## 2020-01-19 MED ORDER — DENOSUMAB 60 MG/ML ~~LOC~~ SOSY
60.0000 mg | PREFILLED_SYRINGE | Freq: Once | SUBCUTANEOUS | Status: AC
  Administered 2020-01-19: 60 mg via SUBCUTANEOUS

## 2020-01-19 NOTE — Progress Notes (Signed)
Patient came in today for Prolia Injectiion and she tolerated it well.  It was given in the left arm subcutaneously.

## 2020-02-01 ENCOUNTER — Other Ambulatory Visit: Payer: Self-pay

## 2020-02-01 MED ORDER — LORAZEPAM 1 MG PO TABS: 1.0000 mg | ORAL_TABLET | Freq: Every day | ORAL | 0 refills | Status: AC

## 2020-02-08 ENCOUNTER — Other Ambulatory Visit: Payer: Self-pay

## 2020-02-08 MED ORDER — CARVEDILOL 6.25 MG PO TABS: 6.2500 mg | ORAL_TABLET | Freq: Two times a day (BID) | ORAL | 1 refills | Status: DC

## 2020-02-08 MED ORDER — BUPROPION HCL 75 MG PO TABS: 75.0000 mg | ORAL_TABLET | Freq: Every day | ORAL | 1 refills | Status: DC

## 2020-02-08 MED ORDER — PREGABALIN 100 MG PO CAPS: 100.0000 mg | ORAL_CAPSULE | Freq: Two times a day (BID) | ORAL | 1 refills | Status: DC

## 2020-02-25 ENCOUNTER — Other Ambulatory Visit: Payer: Self-pay | Admitting: Physician Assistant

## 2020-02-25 MED ORDER — ISOSORBIDE MONONITRATE ER 60 MG PO TB24: 60.0000 mg | ORAL_TABLET | Freq: Every day | ORAL | 0 refills | Status: DC

## 2020-03-04 DIAGNOSIS — R600 Localized edema: Secondary | ICD-10-CM | POA: Diagnosis not present

## 2020-03-04 DIAGNOSIS — Z79899 Other long term (current) drug therapy: Secondary | ICD-10-CM | POA: Diagnosis not present

## 2020-03-04 DIAGNOSIS — Z6833 Body mass index (BMI) 33.0-33.9, adult: Secondary | ICD-10-CM | POA: Diagnosis not present

## 2020-03-04 DIAGNOSIS — R0600 Dyspnea, unspecified: Secondary | ICD-10-CM | POA: Diagnosis not present

## 2020-03-04 DIAGNOSIS — M109 Gout, unspecified: Secondary | ICD-10-CM | POA: Diagnosis not present

## 2020-03-04 DIAGNOSIS — D510 Vitamin B12 deficiency anemia due to intrinsic factor deficiency: Secondary | ICD-10-CM | POA: Diagnosis not present

## 2020-03-04 DIAGNOSIS — E039 Hypothyroidism, unspecified: Secondary | ICD-10-CM | POA: Diagnosis not present

## 2020-03-11 ENCOUNTER — Other Ambulatory Visit: Payer: Self-pay | Admitting: Physician Assistant

## 2020-03-11 ENCOUNTER — Other Ambulatory Visit: Payer: Self-pay | Admitting: Family Medicine

## 2020-03-15 ENCOUNTER — Ambulatory Visit: Payer: Medicare Other | Admitting: Physician Assistant

## 2020-03-21 ENCOUNTER — Ambulatory Visit: Payer: Medicare Other | Admitting: Podiatry

## 2020-04-01 ENCOUNTER — Other Ambulatory Visit: Payer: Self-pay

## 2020-04-01 MED ORDER — LEVOTHYROXINE SODIUM 88 MCG PO TABS: 88.0000 ug | ORAL_TABLET | Freq: Every day | ORAL | 0 refills | Status: DC

## 2020-04-04 ENCOUNTER — Other Ambulatory Visit: Payer: Self-pay

## 2020-04-04 ENCOUNTER — Ambulatory Visit (INDEPENDENT_AMBULATORY_CARE_PROVIDER_SITE_OTHER): Payer: Medicare Other | Admitting: Podiatry

## 2020-04-04 DIAGNOSIS — D689 Coagulation defect, unspecified: Secondary | ICD-10-CM | POA: Diagnosis not present

## 2020-04-04 DIAGNOSIS — B351 Tinea unguium: Secondary | ICD-10-CM

## 2020-04-04 NOTE — Progress Notes (Signed)
°  Subjective:  Patient ID: Tonya Cruz, female    DOB: October 19, 1923,  MRN: [id number]  Chief Complaint  Patient presents with   debride    RFC    86 y.o. female presents with the above complaint. History confirmed with patient.  Objective:  Physical Exam: warm, good capillary refill, nail exam onychomycosis of the toenails, no trophic changes or ulcerative lesions, normal DP and PT pulses, and normal sensory exam. Left Foot: normal exam, no swelling, tenderness, instability; ligaments intact, full range of motion of all ankle/foot joints  Right Foot: normal exam, no swelling, tenderness, instability; ligaments intact, full range of motion of all ankle/foot joints   No images are attached to the encounter.  Assessment:   1. Onychomycosis   2. Coagulation defect [facility])    Plan:  Patient was evaluated and treated and all questions answered.  Onychomycosis and Coagulation Defect  -Nails debrided as below   Procedure: Nail Debridement Rationale: Patient meets criteria for routine foot care due to coag defect Type of Debridement: manual, sharp debridement. Instrumentation: Nail nipper, rotary burr. Number of Nails: 10  Return in about 3 months (around 07/05/2020) for Routine Foot Care - on Anticoagulant.

## 2020-04-28 DIAGNOSIS — H353131 Nonexudative age-related macular degeneration, bilateral, early dry stage: Secondary | ICD-10-CM | POA: Diagnosis not present

## 2020-04-28 DIAGNOSIS — Z961 Presence of intraocular lens: Secondary | ICD-10-CM | POA: Diagnosis not present

## 2020-04-28 DIAGNOSIS — B0239 Other herpes zoster eye disease: Secondary | ICD-10-CM | POA: Diagnosis not present

## 2020-05-09 ENCOUNTER — Other Ambulatory Visit: Payer: Self-pay | Admitting: Physician Assistant

## 2020-05-11 DIAGNOSIS — N390 Urinary tract infection, site not specified: Secondary | ICD-10-CM | POA: Diagnosis not present

## 2020-05-11 DIAGNOSIS — R4182 Altered mental status, unspecified: Secondary | ICD-10-CM | POA: Diagnosis not present

## 2020-05-11 DIAGNOSIS — I10 Essential (primary) hypertension: Secondary | ICD-10-CM | POA: Diagnosis not present

## 2020-05-13 DIAGNOSIS — G9389 Other specified disorders of brain: Secondary | ICD-10-CM | POA: Diagnosis not present

## 2020-05-13 DIAGNOSIS — G319 Degenerative disease of nervous system, unspecified: Secondary | ICD-10-CM | POA: Diagnosis not present

## 2020-05-13 DIAGNOSIS — R4182 Altered mental status, unspecified: Secondary | ICD-10-CM | POA: Diagnosis not present

## 2020-05-13 DIAGNOSIS — J322 Chronic ethmoidal sinusitis: Secondary | ICD-10-CM | POA: Diagnosis not present

## 2020-05-13 DIAGNOSIS — J3489 Other specified disorders of nose and nasal sinuses: Secondary | ICD-10-CM | POA: Diagnosis not present

## 2020-05-26 DIAGNOSIS — R5383 Other fatigue: Secondary | ICD-10-CM | POA: Diagnosis not present

## 2020-05-26 DIAGNOSIS — I480 Paroxysmal atrial fibrillation: Secondary | ICD-10-CM | POA: Diagnosis not present

## 2020-05-26 DIAGNOSIS — I10 Essential (primary) hypertension: Secondary | ICD-10-CM | POA: Diagnosis not present

## 2020-05-26 DIAGNOSIS — E785 Hyperlipidemia, unspecified: Secondary | ICD-10-CM | POA: Diagnosis not present

## 2020-05-26 DIAGNOSIS — Z7189 Other specified counseling: Secondary | ICD-10-CM | POA: Diagnosis not present

## 2020-05-26 DIAGNOSIS — K219 Gastro-esophageal reflux disease without esophagitis: Secondary | ICD-10-CM | POA: Diagnosis not present

## 2020-05-26 DIAGNOSIS — R5381 Other malaise: Secondary | ICD-10-CM | POA: Diagnosis not present

## 2020-05-26 DIAGNOSIS — F418 Other specified anxiety disorders: Secondary | ICD-10-CM | POA: Diagnosis not present

## 2020-05-26 DIAGNOSIS — E039 Hypothyroidism, unspecified: Secondary | ICD-10-CM | POA: Diagnosis not present

## 2020-05-26 DIAGNOSIS — Z6832 Body mass index (BMI) 32.0-32.9, adult: Secondary | ICD-10-CM | POA: Diagnosis not present

## 2020-06-22 NOTE — Progress Notes (Signed)
Error noted on [facility] 01/19/20, Prolia was entered as patient supplied however the Prolia was provided by the office.

## 2020-07-01 ENCOUNTER — Other Ambulatory Visit: Payer: Self-pay | Admitting: Physician Assistant

## 2020-07-06 DIAGNOSIS — M85851 Other specified disorders of bone density and structure, right thigh: Secondary | ICD-10-CM | POA: Diagnosis not present

## 2020-07-06 DIAGNOSIS — M810 Age-related osteoporosis without current pathological fracture: Secondary | ICD-10-CM | POA: Diagnosis not present

## 2020-07-11 ENCOUNTER — Other Ambulatory Visit: Payer: Self-pay

## 2020-07-11 ENCOUNTER — Ambulatory Visit (INDEPENDENT_AMBULATORY_CARE_PROVIDER_SITE_OTHER): Payer: Medicare Other | Admitting: Podiatry

## 2020-07-11 ENCOUNTER — Other Ambulatory Visit: Payer: Self-pay | Admitting: *Deleted

## 2020-07-11 ENCOUNTER — Encounter: Payer: Self-pay | Admitting: Podiatry

## 2020-07-11 DIAGNOSIS — D689 Coagulation defect, unspecified: Secondary | ICD-10-CM

## 2020-07-11 DIAGNOSIS — B351 Tinea unguium: Secondary | ICD-10-CM | POA: Diagnosis not present

## 2020-07-11 NOTE — Progress Notes (Signed)
  Subjective:  Patient ID: Tonya Cruz, female    DOB: 01-24-1924,  MRN: [id number]  Chief Complaint  Patient presents with  . Nail Problem    trim nails     86 y.o. female presents with the above complaint. States the nails grow very long and are hard to cut between visits. Objective:  Physical Exam: warm, good capillary refill, nail exam onychomycosis of the toenails, no trophic changes or ulcerative lesions, normal DP and PT pulses, and normal sensory exam. Left Foot: normal exam, no swelling, tenderness, instability; ligaments intact, full range of motion of all ankle/foot joints  Right Foot: normal exam, no swelling, tenderness, instability; ligaments intact, full range of motion of all ankle/foot joints   No images are attached to the encounter.  Assessment:   1. Onychomycosis   2. Coagulation defect [facility])    Plan:  Patient was evaluated and treated and all questions answered.  Onychomycosis and Coagulation Defect  -Nails debrided as below  Procedure: Nail Debridement Rationale: Patient meets criteria for routine foot care due to coag defect Type of Debridement: manual, sharp debridement. Instrumentation: Nail nipper, rotary burr. Number of Nails: 10   Return in about 3 months (around 10/11/2020) for Diabetic Foot Care.

## 2020-07-15 ENCOUNTER — Ambulatory Visit: Payer: Medicare Other | Admitting: Interventional Cardiology

## 2020-07-17 NOTE — Progress Notes (Signed)
Cardiology Office Note:    Date:  07/22/2020   ID:  OKLA QAZI, DOB 1924-01-17, MRN [id number]  PCP:  Lowella Dandy, NP  Cardiologist:  No primary care provider on file.   Referring MD: Marge Duncans, PA-C   Chief Complaint  Patient presents with  . Coronary Artery Disease  . Atrial Fibrillation  . Hypertension  . Hyperlipidemia    History of Present Illness:    Tonya Cruz is a 86 y.o. female with a hx of breast cancer, primary hypertension, hyperlipidemia, non-ST elevation MI 2018,chronically occluded RCA and stent mid LAD using DES, chronic anticoagulation with apixaban, and A. fib in December 2019.  She feels well.  She is still living independently.  They have been noticing an upward creep in her blood pressure.  Sodium is not controlled in her diet.  She has had a slight increasing creatinine.  No lightheadedness, dizziness, or syncope.  No bleeding on Eliquis.  No symptoms of angina or palpitations to suggest atrial fibrillation.  Was having some lower extremity swelling, which resolved after starting low-dose Lasix 20 mg Monday, Wednesday, and Friday.  She is having difficulty with speech.  Past Medical History:  Diagnosis Date  . Anxiety   . Breast cancer ([place])    right side  . Coronary artery disease    06/11/17 PTCA/DES x1 to mLAD, CTO mRCA with col.   [name] GERD (gastroesophageal reflux disease)   . High cholesterol   . Hypertension   . NSTEMI (non-ST elevated myocardial infarction) ([place]) 06/11/2017  . Thyroid disease     Past Surgical History:  Procedure Laterality Date  . BREAST SURGERY    . CARDIAC CATHETERIZATION  06/11/2017  . CORONARY STENT INTERVENTION N/A 06/11/2017   Procedure: CORONARY STENT INTERVENTION;  Surgeon: Burnell Blanks, MD;  Location: [facility];  Service: Cardiovascular;  Laterality: N/A;  . LEFT HEART CATH AND CORONARY ANGIOGRAPHY N/A 06/11/2017   Procedure: LEFT HEART CATH AND CORONARY ANGIOGRAPHY;  Surgeon:  Burnell Blanks, MD;  Location: [facility];  Service: Cardiovascular;  Laterality: N/A;  . roto cuff      Current Medications: Current Meds  Medication Sig  . apixaban (ELIQUIS) 5 MG TABS tablet Take 1 tablet (5 mg total) by mouth 2 (two) times daily.  [name] atorvastatin (LIPITOR) 40 MG tablet TAKE 1 TABLET(40 MG) BY MOUTH DAILY  . buPROPion (WELLBUTRIN) 75 MG tablet Take 1 tablet (75 mg total) by mouth at bedtime.  . carvedilol (COREG) 6.25 MG tablet TAKE 1 TABLET(6.25 MG) BY MOUTH TWICE DAILY WITH A MEAL  . diltiazem (CARDIZEM CD) 120 MG 24 hr capsule Take 120 mg by mouth daily.  . furosemide (LASIX) 20 MG tablet Take 20 mg by mouth 3 (three) times a week.  . isosorbide mononitrate (IMDUR) 60 MG 24 hr tablet Take 1 tablet (60 mg total) by mouth daily.  [name] levothyroxine (SYNTHROID) 88 MCG tablet Take 1 tablet (88 mcg total) by mouth daily.  [name] LORazepam (ATIVAN) 1 MG tablet Take 1 tablet (1 mg total) by mouth at bedtime. .  . nitroGLYCERIN (NITROSTAT) 0.4 MG SL tablet Place under the tongue.  . pantoprazole (PROTONIX) 40 MG tablet TAKE 1 TABLET(40 MG) BY MOUTH DAILY  . pregabalin (LYRICA) 100 MG capsule Take 1 capsule (100 mg total) by mouth 2 (two) times daily.     Allergies:   Celexa [citalopram hydrobromide], Penicillins, and Ciprofloxacin hcl   Social History   Socioeconomic History  .  Marital status: Widowed    Spouse name: Not on file  . Number of children: Not on file  . Years of education: Not on file  . Highest education level: Not on file  Occupational History  . Not on file  Tobacco Use  . Smoking status: Former Research scientist (life sciences)  . Smokeless tobacco: Never Used  Vaping Use  . Vaping Use: Never used  Substance and Sexual Activity  . Alcohol use: No  . Drug use: No  . Sexual activity: Not Currently  Other Topics Concern  . Not on file  Social History Narrative  . Not on file   Social Determinants of Health   Financial Resource Strain:   . Difficulty of  Paying Living Expenses: Not on file  Food Insecurity:   . Worried About Charity fundraiser in the Last Year: Not on file  . Ran Out of Food in the Last Year: Not on file  Transportation Needs:   . Lack of Transportation (Medical): Not on file  . Lack of Transportation (Non-Medical): Not on file  Physical Activity:   . Days of Exercise per Week: Not on file  . Minutes of Exercise per Session: Not on file  Stress:   . Feeling of Stress : Not on file  Social Connections:   . Frequency of Communication with Friends and Family: Not on file  . Frequency of Social Gatherings with Friends and Family: Not on file  . Attends Religious Services: Not on file  . Active Member of Clubs or Organizations: Not on file  . Attends Archivist Meetings: Not on file  . Marital Status: Not on file     Family History: The patient's family history includes Breast cancer in her mother; Diabetes in her mother; Other in her father.  ROS:   Please see the history of present illness.    She is having difficulty with speech/dystonia.  A CT scan revealed decreased brain volume.  Her legs feel somewhat weak.  She is having some shuffling gait.  She occasionally has tingling in her chest.  Her daughter states that she will take a nitroglycerin if she has any particular problem.  All other systems reviewed and are negative.  EKGs/Labs/Other Studies Reviewed:    The following studies were reviewed today: No new imaging data with [place].  2D Doppler echocardiogram performed October 09, 2019 demonstrated LVEF 75%, diastolic dysfunction, trace aortic regurgitation, and mild mitral regurgitation (done at [facility]).  CT scan performed 05/13/2020 demonstrated generalized cerebral atrophy.  EKG:  EKG sinus bradycardia rhythm, diffuse nonspecific ST abnormality 2, 3, aVF, and V4 through V6.  When compared to October 2020, no significant changes noted  Recent Labs: No results found for requested labs within  last 8760 hours.  Recent Lipid Panel    Component Value Date/Time   CHOL 112 06/09/2017 0400   TRIG 99 06/09/2017 0400   HDL 40 (L) 06/09/2017 0400   CHOLHDL 2.8 06/09/2017 0400   VLDL 20 06/09/2017 0400   LDLCALC 52 06/09/2017 0400    Physical Exam:    VS:  BP (!) 158/64   Pulse (!) 59   Ht 5\' 5"  (1.651 m)   Wt 170 lb (77.1 kg)   SpO2 95%   BMI 28.29 kg/m     Wt Readings from Last 3 Encounters:  07/22/20 170 lb (77.1 kg)  07/10/19 167 lb 9.6 oz (76 kg)  10/17/18 164 lb (74.4 kg)     GEN: Abdominal obesity.  No acute distress HEENT: Normal NECK: No JVD. LYMPHATICS: No lymphadenopathy CARDIAC:  RRR without murmur, gallop, or edema. VASCULAR:  Normal Pulses. No bruits. RESPIRATORY:  Clear to auscultation without rales, wheezing or rhonchi  ABDOMEN: Soft, non-tender, non-distended, No pulsatile mass, MUSCULOSKELETAL: No deformity  SKIN: Warm and dry NEUROLOGIC:  Alert and oriented x 3 PSYCHIATRIC:  Normal affect   ASSESSMENT:    1. Coronary artery disease of native artery of native heart with stable angina pectoris (HCC)   2. Paroxysmal atrial fibrillation ([place])   3. Mixed hyperlipidemia   4. Essential hypertension   5. Anticoagulation adequate   6. Educated about COVID-19 virus infection    PLAN:    In order of problems listed above:  1. Secondary prevention is being adhered to.  Continue high intensity statin therapy.  Most recent LDL was 34. 2. No symptomatic episodes of atrial fibrillation.  She is on Eliquis which will be continued at the current dose based upon age weight and kidney function. 3. Continue lipid management as noted above with Lipitor 40 mg/day. 4. Blood pressure is higher than I would like.  Consistently running above 155.  This is an issue with her on anticoagulation therapy.  Start hydralazine 10 mg twice daily and we will slowly titrate as needed to get systolic pressures less than 150 and closer to 161 mmHg systolic.  Low-salt diet is  discussed. 5. Continue to monitor for criteria which would lead to reduction in Eliquis dose.  If her weight falls below a certain level or kidney function continues to deteriorate may need to go down to 2.5 mg twice daily. 6. She has been vaccinated and boosted and doing well.   Medication Adjustments/Labs and Tests Ordered: Current medicines are reviewed at length with the patient today.  Concerns regarding medicines are outlined above.  Orders Placed This Encounter  Procedures  . EKG 12-Lead   Meds ordered this encounter  Medications  . hydrALAZINE (APRESOLINE) 10 MG tablet    Sig: Take 1 tablet (10 mg total) by mouth in the morning and at bedtime.    Dispense:  180 tablet    Refill:  3    Patient Instructions  Medication Instructions:  1) START Hydralazine 10mg  twice daily.  Monitor blood pressure.  Let us know if you are consistently too high.    *If you need a refill on your cardiac medications before your next appointment, please call your pharmacy*   Lab Work: None If you have labs (blood work) drawn today and your tests are completely normal, you will receive your results only by: [name] MyChart Message (if you have MyChart) OR . A paper copy in the mail If you have any lab test that is abnormal or we need to change your treatment, we will call you to review the results.   Testing/Procedures: None   Follow-Up: At [facility], you and your health needs are our priority.  As part of our continuing mission to provide you with exceptional heart care, we have created designated Provider Care Teams.  These Care Teams include your primary Cardiologist (physician) and Advanced Practice Providers (APPs -  Physician Assistants and Nurse Practitioners) who all work together to provide you with the care you need, when you need it.  We recommend signing up for the patient portal called "MyChart".  Sign up information is provided on this After Visit Summary.  MyChart is used to  connect with patients for Virtual Visits (Telemedicine).  Patients are able  to view lab/test results, encounter notes, upcoming appointments, etc.  Non-urgent messages can be sent to your provider as well.   To learn more about what you can do with MyChart, go to [email or web address].    Your next appointment:   12 month(s)  The format for your next appointment:   In Person  Provider:   You may see Dr. Daneen Schick or one of the following Advanced Practice Providers on your designated Care Team:    Truitt Merle, NP  Cecilie Kicks, NP  Kathyrn Drown, NP    Other Instructions      Signed, Sinclair Grooms, MD  07/22/2020 10:07 AM    [place]

## 2020-07-19 DIAGNOSIS — E669 Obesity, unspecified: Secondary | ICD-10-CM | POA: Diagnosis not present

## 2020-07-19 DIAGNOSIS — Z9181 History of falling: Secondary | ICD-10-CM | POA: Diagnosis not present

## 2020-07-19 DIAGNOSIS — Z139 Encounter for screening, unspecified: Secondary | ICD-10-CM | POA: Diagnosis not present

## 2020-07-19 DIAGNOSIS — Z1331 Encounter for screening for depression: Secondary | ICD-10-CM | POA: Diagnosis not present

## 2020-07-19 DIAGNOSIS — Z0000 Encounter for general adult medical examination without abnormal findings: Secondary | ICD-10-CM | POA: Diagnosis not present

## 2020-07-19 DIAGNOSIS — E785 Hyperlipidemia, unspecified: Secondary | ICD-10-CM | POA: Diagnosis not present

## 2020-07-20 ENCOUNTER — Other Ambulatory Visit: Payer: Self-pay | Admitting: Physician Assistant

## 2020-07-20 DIAGNOSIS — Z23 Encounter for immunization: Secondary | ICD-10-CM | POA: Diagnosis not present

## 2020-07-20 DIAGNOSIS — M810 Age-related osteoporosis without current pathological fracture: Secondary | ICD-10-CM | POA: Diagnosis not present

## 2020-07-22 ENCOUNTER — Ambulatory Visit (INDEPENDENT_AMBULATORY_CARE_PROVIDER_SITE_OTHER): Payer: Medicare Other | Admitting: Interventional Cardiology

## 2020-07-22 ENCOUNTER — Other Ambulatory Visit: Payer: Self-pay

## 2020-07-22 ENCOUNTER — Encounter: Payer: Self-pay | Admitting: Interventional Cardiology

## 2020-07-22 VITALS — BP 158/64 | HR 59 | Ht 65.0 in | Wt 170.0 lb

## 2020-07-22 DIAGNOSIS — E782 Mixed hyperlipidemia: Secondary | ICD-10-CM | POA: Diagnosis not present

## 2020-07-22 DIAGNOSIS — I25118 Atherosclerotic heart disease of native coronary artery with other forms of angina pectoris: Secondary | ICD-10-CM

## 2020-07-22 DIAGNOSIS — Z7901 Long term (current) use of anticoagulants: Secondary | ICD-10-CM

## 2020-07-22 DIAGNOSIS — I480 Paroxysmal atrial fibrillation: Secondary | ICD-10-CM

## 2020-07-22 DIAGNOSIS — Z7189 Other specified counseling: Secondary | ICD-10-CM | POA: Diagnosis not present

## 2020-07-22 DIAGNOSIS — I10 Essential (primary) hypertension: Secondary | ICD-10-CM | POA: Diagnosis not present

## 2020-07-22 MED ORDER — HYDRALAZINE HCL 10 MG PO TABS: 10.0000 mg | ORAL_TABLET | Freq: Two times a day (BID) | ORAL | 3 refills | Status: DC

## 2020-07-22 NOTE — Patient Instructions (Signed)
Medication Instructions:  1) START Hydralazine 10mg  twice daily.  Monitor blood pressure.  Let us know if you are consistently too high.    *If you need a refill on your cardiac medications before your next appointment, please call your pharmacy*   Lab Work: None If you have labs (blood work) drawn today and your tests are completely normal, you will receive your results only by: [name] MyChart Message (if you have MyChart) OR . A paper copy in the mail If you have any lab test that is abnormal or we need to change your treatment, we will call you to review the results.   Testing/Procedures: None   Follow-Up: At [facility], you and your health needs are our priority.  As part of our continuing mission to provide you with exceptional heart care, we have created designated Provider Care Teams.  These Care Teams include your primary Cardiologist (physician) and Advanced Practice Providers (APPs -  Physician Assistants and Nurse Practitioners) who all work together to provide you with the care you need, when you need it.  We recommend signing up for the patient portal called "MyChart".  Sign up information is provided on this After Visit Summary.  MyChart is used to connect with patients for Virtual Visits (Telemedicine).  Patients are able to view lab/test results, encounter notes, upcoming appointments, etc.  Non-urgent messages can be sent to your provider as well.   To learn more about what you can do with MyChart, go to [email or web address].    Your next appointment:   12 month(s)  The format for your next appointment:   In Person  Provider:   You may see Dr. Daneen Schick or one of the following Advanced Practice Providers on your designated Care Team:    Truitt Merle, NP  Cecilie Kicks, NP  Kathyrn Drown, NP    Other Instructions

## 2020-08-06 ENCOUNTER — Other Ambulatory Visit: Payer: Self-pay | Admitting: Physician Assistant

## 2020-08-15 ENCOUNTER — Other Ambulatory Visit: Payer: Self-pay | Admitting: *Deleted

## 2020-08-15 MED ORDER — HYDRALAZINE HCL 25 MG PO TABS: 25.0000 mg | ORAL_TABLET | Freq: Two times a day (BID) | ORAL | 3 refills | Status: DC

## 2020-08-16 ENCOUNTER — Other Ambulatory Visit: Payer: Self-pay | Admitting: Physician Assistant

## 2020-09-06 DIAGNOSIS — M79601 Pain in right arm: Secondary | ICD-10-CM | POA: Diagnosis not present

## 2020-09-06 DIAGNOSIS — Z6832 Body mass index (BMI) 32.0-32.9, adult: Secondary | ICD-10-CM | POA: Diagnosis not present

## 2020-09-06 DIAGNOSIS — R2231 Localized swelling, mass and lump, right upper limb: Secondary | ICD-10-CM | POA: Diagnosis not present

## 2020-09-12 ENCOUNTER — Encounter: Payer: Self-pay | Admitting: Podiatry

## 2020-09-12 ENCOUNTER — Other Ambulatory Visit: Payer: Self-pay

## 2020-09-12 ENCOUNTER — Ambulatory Visit (INDEPENDENT_AMBULATORY_CARE_PROVIDER_SITE_OTHER): Payer: Medicare Other | Admitting: Podiatry

## 2020-09-12 DIAGNOSIS — B351 Tinea unguium: Secondary | ICD-10-CM | POA: Diagnosis not present

## 2020-09-12 DIAGNOSIS — D689 Coagulation defect, unspecified: Secondary | ICD-10-CM | POA: Diagnosis not present

## 2020-09-12 MED ORDER — HYDRALAZINE HCL 50 MG PO TABS: 50.0000 mg | ORAL_TABLET | Freq: Two times a day (BID) | ORAL | 3 refills | Status: DC

## 2020-09-12 NOTE — Progress Notes (Signed)
  Subjective:  Patient ID: [name], female    DOB: 08/27/1924,  MRN: [id number]  Chief Complaint  Patient presents with  . Nail Problem    trim nails    86 y.o. female presents with the above complaint.  Objective:  Physical Exam: warm, good capillary refill, nail exam onychomycosis of the toenails, no trophic changes or ulcerative lesions, normal DP and PT pulses, and normal sensory exam. Left Foot: normal exam, no swelling, tenderness, instability; ligaments intact, full range of motion of all ankle/foot joints  Right Foot: normal exam, no swelling, tenderness, instability; ligaments intact, full range of motion of all ankle/foot joints   No images are attached to the encounter.  Assessment:   1. Onychomycosis   2. Coagulation defect [facility])    Plan:  Patient was evaluated and treated and all questions answered.  Onychomycosis and Coagulation Defect  -Nails debrided as below  Procedure: Nail Debridement Type of Debridement: manual, sharp debridement. Instrumentation: Nail nipper, rotary burr. Number of Nails: 10  No follow-ups on file.

## 2020-09-23 DIAGNOSIS — R0600 Dyspnea, unspecified: Secondary | ICD-10-CM | POA: Diagnosis not present

## 2020-09-23 DIAGNOSIS — M7989 Other specified soft tissue disorders: Secondary | ICD-10-CM | POA: Diagnosis not present

## 2020-09-23 DIAGNOSIS — M79601 Pain in right arm: Secondary | ICD-10-CM | POA: Diagnosis not present

## 2020-09-23 DIAGNOSIS — N6459 Other signs and symptoms in breast: Secondary | ICD-10-CM | POA: Diagnosis not present

## 2020-09-23 DIAGNOSIS — I480 Paroxysmal atrial fibrillation: Secondary | ICD-10-CM | POA: Diagnosis not present

## 2020-09-23 DIAGNOSIS — Z853 Personal history of malignant neoplasm of breast: Secondary | ICD-10-CM | POA: Diagnosis not present

## 2020-09-23 DIAGNOSIS — I10 Essential (primary) hypertension: Secondary | ICD-10-CM | POA: Diagnosis not present

## 2020-09-23 DIAGNOSIS — I890 Lymphedema, not elsewhere classified: Secondary | ICD-10-CM | POA: Diagnosis not present

## 2020-10-05 ENCOUNTER — Other Ambulatory Visit: Payer: Self-pay | Admitting: *Deleted

## 2020-10-05 DIAGNOSIS — I25118 Atherosclerotic heart disease of native coronary artery with other forms of angina pectoris: Secondary | ICD-10-CM

## 2020-10-05 DIAGNOSIS — I10 Essential (primary) hypertension: Secondary | ICD-10-CM

## 2020-10-05 DIAGNOSIS — I480 Paroxysmal atrial fibrillation: Secondary | ICD-10-CM

## 2020-10-05 MED ORDER — FUROSEMIDE 20 MG PO TABS: ORAL_TABLET | ORAL | 5 refills | Status: DC

## 2020-10-05 NOTE — Telephone Encounter (Signed)
Called daughter and left message to either call back or check MyChart for recommendations and need for scheduling labs.

## 2020-10-14 DIAGNOSIS — I890 Lymphedema, not elsewhere classified: Secondary | ICD-10-CM | POA: Insufficient documentation

## 2020-10-19 ENCOUNTER — Telehealth: Payer: Self-pay | Admitting: Interventional Cardiology

## 2020-10-19 ENCOUNTER — Other Ambulatory Visit: Payer: Self-pay | Admitting: Family Medicine

## 2020-10-19 DIAGNOSIS — Z853 Personal history of malignant neoplasm of breast: Secondary | ICD-10-CM | POA: Insufficient documentation

## 2020-10-19 DIAGNOSIS — N6459 Other signs and symptoms in breast: Secondary | ICD-10-CM | POA: Diagnosis not present

## 2020-10-19 DIAGNOSIS — N6452 Nipple discharge: Secondary | ICD-10-CM | POA: Diagnosis not present

## 2020-10-19 DIAGNOSIS — R922 Inconclusive mammogram: Secondary | ICD-10-CM | POA: Diagnosis not present

## 2020-10-19 DIAGNOSIS — C50919 Malignant neoplasm of unspecified site of unspecified female breast: Secondary | ICD-10-CM | POA: Insufficient documentation

## 2020-10-19 NOTE — Telephone Encounter (Signed)
Active order for BMET faxed to [facility] at the request of the patient. Patients daughter made aware.

## 2020-10-19 NOTE — Telephone Encounter (Signed)
Follow up:     Patient daughter calling back to see if some one can send a order to [place] for a BMET fax number is [phone] and apt is at 10 am. that she sent a mychart message yesterday. Please call the patient note.

## 2020-10-26 DIAGNOSIS — C50411 Malignant neoplasm of upper-outer quadrant of right female breast: Secondary | ICD-10-CM | POA: Insufficient documentation

## 2020-11-09 ENCOUNTER — Other Ambulatory Visit: Payer: Self-pay | Admitting: Hematology and Oncology

## 2020-11-09 ENCOUNTER — Inpatient Hospital Stay: Payer: Medicare Other | Attending: Oncology

## 2020-11-09 ENCOUNTER — Encounter: Payer: Self-pay | Admitting: Oncology

## 2020-11-09 ENCOUNTER — Other Ambulatory Visit: Payer: Self-pay | Admitting: Oncology

## 2020-11-09 ENCOUNTER — Other Ambulatory Visit: Payer: Self-pay

## 2020-11-09 ENCOUNTER — Inpatient Hospital Stay (INDEPENDENT_AMBULATORY_CARE_PROVIDER_SITE_OTHER): Payer: Medicare Other | Admitting: Oncology

## 2020-11-09 DIAGNOSIS — C50011 Malignant neoplasm of nipple and areola, right female breast: Secondary | ICD-10-CM

## 2020-11-09 DIAGNOSIS — N6459 Other signs and symptoms in breast: Secondary | ICD-10-CM | POA: Diagnosis not present

## 2020-11-09 DIAGNOSIS — I252 Old myocardial infarction: Secondary | ICD-10-CM

## 2020-11-09 DIAGNOSIS — N6452 Nipple discharge: Secondary | ICD-10-CM

## 2020-11-09 DIAGNOSIS — I890 Lymphedema, not elsewhere classified: Secondary | ICD-10-CM

## 2020-11-09 DIAGNOSIS — Z923 Personal history of irradiation: Secondary | ICD-10-CM

## 2020-11-09 DIAGNOSIS — M8580 Other specified disorders of bone density and structure, unspecified site: Secondary | ICD-10-CM

## 2020-11-09 DIAGNOSIS — Z853 Personal history of malignant neoplasm of breast: Secondary | ICD-10-CM

## 2020-11-09 DIAGNOSIS — Z0001 Encounter for general adult medical examination with abnormal findings: Secondary | ICD-10-CM | POA: Diagnosis not present

## 2020-11-09 DIAGNOSIS — Z87891 Personal history of nicotine dependence: Secondary | ICD-10-CM

## 2020-11-09 DIAGNOSIS — D649 Anemia, unspecified: Secondary | ICD-10-CM | POA: Diagnosis not present

## 2020-11-09 LAB — BASIC METABOLIC PANEL
BUN: 16 (ref 4–21)
CO2: 27 — AB (ref 13–22)
Chloride: 104 (ref 99–108)
Creatinine: 1.1 (ref 0.5–1.1)
Glucose: 99
Potassium: 4.4 (ref 3.4–5.3)
Sodium: 139 (ref 137–147)

## 2020-11-09 LAB — CBC: RBC: 4.66 (ref 3.87–5.11)

## 2020-11-09 LAB — CBC AND DIFFERENTIAL
HCT: 39 (ref 36–46)
Hemoglobin: 12.6 (ref 12.0–16.0)
Neutrophils Absolute: 5.01
Platelets: 143 — AB (ref 150–399)
WBC: 6.5

## 2020-11-09 LAB — COMPREHENSIVE METABOLIC PANEL: Calcium: 8.6 — AB (ref 8.7–10.7)

## 2020-11-09 NOTE — Progress Notes (Signed)
[facility]  [address] [place],  [place]  [address] [phone]  Clinic Day:  11/09/2020  Referring physician: Lowella Dandy, NP   This document serves as a record of services personally performed by Hosie Poisson, MD. It was created on their behalf by Curry,Lauren E, a trained medical scribe. The creation of this record is based on the scribe's personal observations and the provider's statements to them.   CHIEF COMPLAINT:  CC: History of right breast cancer  Current Treatment:  Surveillance   HISTORY OF PRESENT ILLNESS:  Tonya Cruz is a 87 y.o. female referred by Laverna Peace, NP for further management of the patients history of breast cancer.  I have seen her previously when she was diagnosed with stage IA right breast cancer in 2001, treated with lumpectomy.  No sentinel lymph nodes were excised.  She also received radiation.  She has had some chronic nipple inversion and discharge for years and described crystals forming. She has also had chronic swelling of the right breast for years, as documented by the daughter.  I last saw her in 2013 and did mammogram and ultrasound at that time, to evaluate these findings and they were negative.  Diagnostic right mammogram and right ultrasound from January 12th 2022 revealed possible intraductal mass versus dilated duct with debris at 10 o'clock in the right breast measuring 0.9 cm.  Biopsy was recommended but the patient has refused.  They also suggested a punch biopsy of the skin changes, but these have been longstanding.  They also suggested an MRI scan and the patient is not interested in an aggressive evaluation or treatment.  Her main complaint is lymphedema of the right upper extremity.  This has been severe swelling for some time and is interfering with her ability to use the right arm.  She denies any cuts, or bites or infections in that arm.    INTERVAL HISTORY:  Tonya Cruz reports right upper  extremity lymphedema, which is bothering her, and pain of the right shoulder which she rates as a 5-6/10.  She does not take any pain medication.  She has had rotator cuff repair on the right twice, and once on the left.  She has speech dystonia, which is exacerbated by anxiety and stress.  She has had one recent fall on the right side and wears a life alert.  Bone density scan from September 2021 was normal other than osteopenia of the forearm, for which she is on Prolia.  Chemistries are unremarkable except for a creatinine of 1.1.  Her  appetite is good, and she is eating well.  She denies fever, chills or other signs of infection.  She denies nausea, vomiting, bowel issues, or abdominal pain.  She denies sore throat, cough, dyspnea, or chest pain.  REVIEW OF SYSTEMS:  Review of Systems  Constitutional: Negative.   HENT:  Negative.   Eyes: Negative.   Respiratory: Negative.   Cardiovascular: Negative.   Gastrointestinal: Negative.   Endocrine: Negative.   Genitourinary: Positive for nocturia (2-3 times per night).   Musculoskeletal:       Lymphedema of the right upper extremity; right shoulder pain  Skin: Negative.   Neurological: Positive for speech difficulty (dystonia).  Hematological: Negative.   Psychiatric/Behavioral: Negative.      VITALS:  Blood pressure (!) 176/89, pulse 65, temperature 97.6 F (36.4 C), temperature source Oral, resp. rate 18, height 5\' 5"  (1.651 m), weight 169 lb 1.6 oz (76.7 kg), SpO2  97 %.  Wt Readings from Last 3 Encounters:  11/09/20 169 lb 1.6 oz (76.7 kg)  07/22/20 170 lb (77.1 kg)  07/10/19 167 lb 9.6 oz (76 kg)    Body mass index is 28.14 kg/m.  Performance status (ECOG): 1 - Symptomatic but completely ambulatory  PHYSICAL EXAM:  Physical Exam Constitutional:      General: She is not in acute distress.    Appearance: Normal appearance. She is normal weight.  HENT:     Head: Normocephalic and atraumatic.  Eyes:     General: No scleral  icterus.    Extraocular Movements: Extraocular movements intact.     Conjunctiva/sclera: Conjunctivae normal.     Pupils: Pupils are equal, round, and reactive to light.  Cardiovascular:     Rate and Rhythm: Normal rate and regular rhythm.     Pulses: Normal pulses.     Heart sounds: Normal heart sounds. No murmur heard. No friction rub. No gallop.   Pulmonary:     Effort: Pulmonary effort is normal. No respiratory distress.     Breath sounds: Normal breath sounds.  Chest:     Comments: Deep scar in the inferior right breast and inverted nipple on the right, no discharge at present.  Patient states that this is stable.  Edema of the upper right breast. No masses in either breast.  Abdominal:     General: Bowel sounds are normal. There is no distension.     Palpations: Abdomen is soft. There is no mass.     Tenderness: There is no abdominal tenderness.  Musculoskeletal:        General: Normal range of motion.     Cervical back: Normal range of motion and neck supple.     Right lower leg: No edema.     Left lower leg: No edema.     Comments: 1-2+ lymphedema of the right upper extremity.  There is mild pinkness of the skin, but there is no warmth or tenderness.  Lymphadenopathy:     Cervical: No cervical adenopathy.  Skin:    General: Skin is warm and dry.  Neurological:     General: No focal deficit present.     Mental Status: She is alert and oriented to person, place, and time. Mental status is at baseline.  Psychiatric:        Mood and Affect: Mood normal.        Behavior: Behavior normal.        Thought Content: Thought content normal.        Judgment: Judgment normal.     LABS:   CBC Latest Ref Rng & Units 07/06/2017 06/28/2017 06/12/2017  WBC 4.0 - 10.5 K/uL 9.4 7.0 6.0  Hemoglobin 12.0 - 15.0 g/dL 11.5(L) 11.0(L) 10.4(L)  Hematocrit 36.0 - 46.0 % 35.3(L) 32.9(L) 33.1(L)  Platelets 150 - 400 K/uL 168 193 130(L)   CMP Latest Ref Rng & Units 07/07/2017 07/06/2017 06/28/2017   Glucose 65 - 99 mg/dL 79 89 71  BUN 6 - 20 mg/dL 16 16 16   Creatinine 0.44 - 1.00 mg/dL 1.15(H) 0.98 1.08(H)  Sodium 135 - 145 mmol/L 129(L) 124(L) 130(L)  Potassium 3.5 - 5.1 mmol/L 4.4 4.4 5.2  Chloride 101 - 111 mmol/L 99(L) 93(L) 93(L)  CO2 22 - 32 mmol/L 23 23 23   Calcium 8.9 - 10.3 mg/dL 8.3(L) 8.8(L) 9.0    STUDIES:   She underwent a digital diagnostic right mammogram and right breast ultrasound on 10/19/2020 showing: breast density category  B.  Possible intraductal mass versus dilated duct with debris at 10 o'clock in the right breast.   HISTORY:   Past Medical History:  Diagnosis Date  . Allergy   . Anemia    pernicious  . Anxiety   . Arthritis   . Breast cancer ([facility])    right side  . Coronary artery disease    06/11/17 PTCA/DES x1 to mLAD, CTO mRCA with col.   [name] GERD (gastroesophageal reflux disease)   . High cholesterol   . History of myocardial infarction   . Hypertension   . NSTEMI (non-ST elevated myocardial infarction) ([facility]) 06/11/2017  . Osteoporosis   . Paroxysmal atrial fibrillation ([facility])   . Post herpetic neuralgia   . Thyroid disease     Past Surgical History:  Procedure Laterality Date  . BREAST SURGERY    . CARDIAC CATHETERIZATION  06/11/2017  . CORONARY STENT INTERVENTION N/A 06/11/2017   Procedure: CORONARY STENT INTERVENTION;  Surgeon: Burnell Blanks, MD;  Location: [facility];  Service: Cardiovascular;  Laterality: N/A;  . LEFT HEART CATH AND CORONARY ANGIOGRAPHY N/A 06/11/2017   Procedure: LEFT HEART CATH AND CORONARY ANGIOGRAPHY;  Surgeon: Burnell Blanks, MD;  Location: [facility];  Service: Cardiovascular;  Laterality: N/A;  . roto cuff    . TONSILLECTOMY      Family History  Problem Relation Age of Onset  . Diabetes Mother   . Breast cancer Mother   . Other Father        cerebral hemorrhage    Social History:  reports that she has quit smoking. She has never used smokeless tobacco. She reports that  she does not drink alcohol and does not use drugs.The patient is accompanied by her daughter today.  She is widowed and lives at home alone but has a caregiver Monday through Saturday.  She has 2 children.  She is retired, and has never been exposed to chemicals.    Allergies:  Allergies  Allergen Reactions  . Celexa [Citalopram Hydrobromide] Other (See Comments)    Low sodium  . Penicillins     Has patient had a PCN reaction causing immediate rash, facial/tongue/throat swelling, SOB or lightheadedness with hypotension: No Has patient had a PCN reaction causing severe rash involving mucus membranes or skin necrosis: No Has patient had a PCN reaction that required hospitalization: No Has patient had a PCN reaction occurring within the last 10 years: No If all of the above answers are "NO", then may proceed with Cephalosporin use.  . Ciprofloxacin Hcl Rash    Current Medications: Current Outpatient Medications  Medication Sig Dispense Refill  . apixaban (ELIQUIS) 5 MG TABS tablet Take 1 tablet (5 mg total) by mouth 2 (two) times daily. 60 tablet 11  . atorvastatin (LIPITOR) 40 MG tablet TAKE 1 TABLET(40 MG) BY MOUTH DAILY 90 tablet 1  . buPROPion (WELLBUTRIN) 75 MG tablet Take 1 tablet (75 mg total) by mouth at bedtime. 90 tablet 1  . carvedilol (COREG) 6.25 MG tablet TAKE 1 TABLET(6.25 MG) BY MOUTH TWICE DAILY WITH A MEAL 180 tablet 1  . diltiazem (CARDIZEM CD) 120 MG 24 hr capsule Take 120 mg by mouth daily.    . furosemide (LASIX) 20 MG tablet Take one tablet by mouth daily Monday through Friday (Patient taking differently: Take one tablet by mouth daily Monday, Wednesday and Friday) 20 tablet 5  . hydrALAZINE (APRESOLINE) 50 MG tablet Take 1 tablet (50 mg total) by mouth in  the morning and at bedtime. 180 tablet 3  . isosorbide mononitrate (IMDUR) 60 MG 24 hr tablet Take 1 tablet (60 mg total) by mouth daily. 90 tablet 0  . levothyroxine (SYNTHROID) 88 MCG tablet Take 1 tablet (88 mcg  total) by mouth daily. 90 tablet 0  . LORazepam (ATIVAN) 1 MG tablet Take 1 tablet (1 mg total) by mouth at bedtime. . (Patient taking differently: Take 1 mg by mouth 3 times/day as needed-between meals & bedtime. [name]) 30 tablet 0  . nitroGLYCERIN (NITROSTAT) 0.4 MG SL tablet Place under the tongue.    . pantoprazole (PROTONIX) 40 MG tablet TAKE 1 TABLET(40 MG) BY MOUTH DAILY 90 tablet 0  . pregabalin (LYRICA) 100 MG capsule TAKE 1 CAPSULE(100 MG) BY MOUTH TWICE DAILY 180 capsule 0   No current facility-administered medications for this visit.     ASSESSMENT & PLAN:   Assessment:   1.  History of stage I right breast cancer, diagnosed in 2001, treated with lumpectomy and radiation therapy.  She has chronic skin/nipple changes and occasional nipple discharge.  2.  Possible intraductal mass versus dilated duct with debris at 10 o'clock in the right breast.  Biopsy has been recommended, but the patient has refused aggressive evaluation or treatment.  We will continue to monitor and may repeat imaging at a later time.  We would not pursue surgery if this were cancer, but I mentioned that we could consider hormonal therapy with a daily pill, if it is estrogen sensitive.  3.  Lymphedema of the right upper extremity, which is her main complaint.  We will refer her to Amy the lymphedema specialist with occupational therapy for further management.  I also gave her a booklet on lymphedema and treatments today and reviewed the key precautions and measures to take.    4.  Osteopenia, for which she is on Prolia, with improvement.   Plan: This is a pleasant 87 year old female with a history of stage I right breast cancer, diagnosed in 2001.  This was treated with lumpectomy and radiation.  She now has a possible suspicious lesion at 10 o'clock in the right breast, and biopsy has been recommended, but the patient has refused.  I explained the reasoning behind the biopsy, and that the findings may or may not  represent cancer.  If this is indeed cancerous, it is likely slow growing and will not affect her life.  I do not really suspect inflammatory breast cancer, as these findings have been chronic and are mild.  This area is very small at 0.9 cm, and we will continue to monitor.  Her main complaint is lymphedema of the right upper extremity and so we will refer her to Amy the lymphedema specialist with occupational therapy.  We reviewed possible treatment options today, but will defer to Amy for final opinion.  I gave her a booklet on lymphedema and its treatments today.  We will see her back in 6 months for repeat examination.  She and her daughter understand and agree with this plan of care.  I have answered their questions, and they know to call with any concerns.  Thank you for the opportunity to participate in the care of your patients   I provided 50 minutes of face-to-face time during this this encounter and > 50% was spent counseling as documented under my assessment and plan.    Derwood Kaplan, MD [facility] [address]  [place] [address] Dept: [phone] Dept Fax: [phone]   I, Rita Ohara, am acting as scribe for Derwood Kaplan, MD  I have reviewed this report as typed by the medical scribe, and it is complete and accurate.

## 2020-11-14 DIAGNOSIS — I972 Postmastectomy lymphedema syndrome: Secondary | ICD-10-CM | POA: Diagnosis not present

## 2020-11-14 DIAGNOSIS — Z853 Personal history of malignant neoplasm of breast: Secondary | ICD-10-CM | POA: Diagnosis not present

## 2020-11-25 DIAGNOSIS — Z853 Personal history of malignant neoplasm of breast: Secondary | ICD-10-CM | POA: Diagnosis not present

## 2020-11-25 DIAGNOSIS — I972 Postmastectomy lymphedema syndrome: Secondary | ICD-10-CM | POA: Diagnosis not present

## 2020-11-28 DIAGNOSIS — Z853 Personal history of malignant neoplasm of breast: Secondary | ICD-10-CM | POA: Diagnosis not present

## 2020-11-28 DIAGNOSIS — I972 Postmastectomy lymphedema syndrome: Secondary | ICD-10-CM | POA: Diagnosis not present

## 2020-11-30 DIAGNOSIS — Z853 Personal history of malignant neoplasm of breast: Secondary | ICD-10-CM | POA: Diagnosis not present

## 2020-11-30 DIAGNOSIS — I972 Postmastectomy lymphedema syndrome: Secondary | ICD-10-CM | POA: Diagnosis not present

## 2020-12-02 ENCOUNTER — Other Ambulatory Visit: Payer: Self-pay

## 2020-12-02 DIAGNOSIS — Z853 Personal history of malignant neoplasm of breast: Secondary | ICD-10-CM | POA: Diagnosis not present

## 2020-12-02 DIAGNOSIS — I972 Postmastectomy lymphedema syndrome: Secondary | ICD-10-CM | POA: Diagnosis not present

## 2020-12-02 MED ORDER — ISOSORBIDE MONONITRATE ER 60 MG PO TB24: 60.0000 mg | ORAL_TABLET | Freq: Every day | ORAL | 2 refills | Status: AC

## 2020-12-05 DIAGNOSIS — Z853 Personal history of malignant neoplasm of breast: Secondary | ICD-10-CM | POA: Diagnosis not present

## 2020-12-05 DIAGNOSIS — I972 Postmastectomy lymphedema syndrome: Secondary | ICD-10-CM | POA: Diagnosis not present

## 2020-12-06 ENCOUNTER — Other Ambulatory Visit: Payer: Self-pay | Admitting: Physician Assistant

## 2020-12-08 DIAGNOSIS — L309 Dermatitis, unspecified: Secondary | ICD-10-CM | POA: Diagnosis not present

## 2020-12-12 ENCOUNTER — Encounter: Payer: Self-pay | Admitting: Oncology

## 2020-12-12 ENCOUNTER — Other Ambulatory Visit: Payer: Self-pay

## 2020-12-12 ENCOUNTER — Ambulatory Visit (INDEPENDENT_AMBULATORY_CARE_PROVIDER_SITE_OTHER): Payer: Medicare Other | Admitting: Podiatry

## 2020-12-12 DIAGNOSIS — B351 Tinea unguium: Secondary | ICD-10-CM | POA: Diagnosis not present

## 2020-12-12 DIAGNOSIS — D689 Coagulation defect, unspecified: Secondary | ICD-10-CM

## 2020-12-12 NOTE — Progress Notes (Signed)
  Subjective:  Patient ID: Tonya Cruz, female    DOB: 1923-12-14,  MRN: [id number]  No chief complaint on file.  87 y.o. female presents with the above complaint. Still on eliquis. Denies new issues. Objective:  Physical Exam: warm, good capillary refill, nail exam onychomycosis of the toenails, no trophic changes or ulcerative lesions, normal DP and PT pulses, and normal sensory exam. Left Foot: normal exam, no swelling, tenderness, instability; ligaments intact, full range of motion of all ankle/foot joints  Right Foot: normal exam, no swelling, tenderness, instability; ligaments intact, full range of motion of all ankle/foot joints   No images are attached to the encounter.  Assessment:   1. Onychomycosis   2. Coagulation defect [facility])    Plan:  Patient was evaluated and treated and all questions answered.  Onychomycosis and Coagulation Defect  -Nails debrided as below  Procedure: Nail Debridement Type of Debridement: manual, sharp debridement. Instrumentation: Nail nipper, rotary burr. Number of Nails: 10     Return in about 3 months (around 03/14/2021) for At risk foot care - on anticoagulant.

## 2021-01-05 DIAGNOSIS — H1013 Acute atopic conjunctivitis, bilateral: Secondary | ICD-10-CM | POA: Diagnosis not present

## 2021-01-09 DIAGNOSIS — I890 Lymphedema, not elsewhere classified: Secondary | ICD-10-CM | POA: Diagnosis not present

## 2021-01-09 DIAGNOSIS — E039 Hypothyroidism, unspecified: Secondary | ICD-10-CM | POA: Diagnosis not present

## 2021-01-09 DIAGNOSIS — I10 Essential (primary) hypertension: Secondary | ICD-10-CM | POA: Diagnosis not present

## 2021-01-09 DIAGNOSIS — B0229 Other postherpetic nervous system involvement: Secondary | ICD-10-CM | POA: Diagnosis not present

## 2021-01-09 DIAGNOSIS — K219 Gastro-esophageal reflux disease without esophagitis: Secondary | ICD-10-CM | POA: Diagnosis not present

## 2021-01-09 DIAGNOSIS — R5383 Other fatigue: Secondary | ICD-10-CM | POA: Diagnosis not present

## 2021-01-09 DIAGNOSIS — R5381 Other malaise: Secondary | ICD-10-CM | POA: Diagnosis not present

## 2021-01-09 DIAGNOSIS — I480 Paroxysmal atrial fibrillation: Secondary | ICD-10-CM | POA: Diagnosis not present

## 2021-01-09 DIAGNOSIS — F418 Other specified anxiety disorders: Secondary | ICD-10-CM | POA: Diagnosis not present

## 2021-01-09 DIAGNOSIS — M810 Age-related osteoporosis without current pathological fracture: Secondary | ICD-10-CM | POA: Diagnosis not present

## 2021-01-09 DIAGNOSIS — Z6832 Body mass index (BMI) 32.0-32.9, adult: Secondary | ICD-10-CM | POA: Diagnosis not present

## 2021-01-09 DIAGNOSIS — E785 Hyperlipidemia, unspecified: Secondary | ICD-10-CM | POA: Diagnosis not present

## 2021-01-27 DIAGNOSIS — M810 Age-related osteoporosis without current pathological fracture: Secondary | ICD-10-CM | POA: Diagnosis not present

## 2021-02-20 ENCOUNTER — Ambulatory Visit (INDEPENDENT_AMBULATORY_CARE_PROVIDER_SITE_OTHER): Payer: Medicare Other | Admitting: Podiatry

## 2021-02-20 ENCOUNTER — Other Ambulatory Visit: Payer: Self-pay

## 2021-02-20 DIAGNOSIS — B351 Tinea unguium: Secondary | ICD-10-CM

## 2021-02-20 DIAGNOSIS — D689 Coagulation defect, unspecified: Secondary | ICD-10-CM | POA: Diagnosis not present

## 2021-02-20 NOTE — Progress Notes (Signed)
  Subjective:  Patient ID: Tonya Cruz, female    DOB: [birth year],  MRN: [id number]  Chief Complaint  Patient presents with  . debride    RFC -pt on Eliquis   87 y.o. female presents with the above complaint. On eliquis. Denies new pedal issues. Objective:  Physical Exam: warm, good capillary refill, nail exam onychomycosis of the toenails, no trophic changes or ulcerative lesions, normal DP and PT pulses, and normal sensory exam. Left Foot: normal exam, no swelling, tenderness, instability; ligaments intact, full range of motion of all ankle/foot joints  Right Foot: normal exam, no swelling, tenderness, instability; ligaments intact, full range of motion of all ankle/foot joints   No images are attached to the encounter.  Assessment:   1. Onychomycosis   2. Coagulation defect [facility])    Plan:  Patient was evaluated and treated and all questions answered.  Onychomycosis and Coagulation Defect  -Nails debrided as below   Procedure: Nail Debridement Type of Debridement: manual, sharp debridement. Instrumentation: Nail nipper, rotary burr. Number of Nails: 10       Return in about 3 months (around 05/23/2021) for Routine Foot Care - on Anticoagulant.

## 2021-02-21 DIAGNOSIS — E039 Hypothyroidism, unspecified: Secondary | ICD-10-CM | POA: Diagnosis not present

## 2021-03-04 ENCOUNTER — Other Ambulatory Visit: Payer: Self-pay | Admitting: Family Medicine

## 2021-03-07 DIAGNOSIS — I890 Lymphedema, not elsewhere classified: Secondary | ICD-10-CM | POA: Diagnosis not present

## 2021-03-07 DIAGNOSIS — B0229 Other postherpetic nervous system involvement: Secondary | ICD-10-CM | POA: Diagnosis not present

## 2021-04-06 DIAGNOSIS — Z6833 Body mass index (BMI) 33.0-33.9, adult: Secondary | ICD-10-CM | POA: Diagnosis not present

## 2021-04-06 DIAGNOSIS — E039 Hypothyroidism, unspecified: Secondary | ICD-10-CM | POA: Diagnosis not present

## 2021-04-06 DIAGNOSIS — I10 Essential (primary) hypertension: Secondary | ICD-10-CM | POA: Diagnosis not present

## 2021-04-06 DIAGNOSIS — R600 Localized edema: Secondary | ICD-10-CM | POA: Diagnosis not present

## 2021-04-06 DIAGNOSIS — I480 Paroxysmal atrial fibrillation: Secondary | ICD-10-CM | POA: Diagnosis not present

## 2021-04-07 DIAGNOSIS — I972 Postmastectomy lymphedema syndrome: Secondary | ICD-10-CM | POA: Diagnosis not present

## 2021-04-24 ENCOUNTER — Ambulatory Visit (INDEPENDENT_AMBULATORY_CARE_PROVIDER_SITE_OTHER): Payer: Medicare Other | Admitting: Podiatry

## 2021-04-24 ENCOUNTER — Other Ambulatory Visit: Payer: Self-pay

## 2021-04-24 DIAGNOSIS — D689 Coagulation defect, unspecified: Secondary | ICD-10-CM | POA: Diagnosis not present

## 2021-04-24 DIAGNOSIS — B351 Tinea unguium: Secondary | ICD-10-CM

## 2021-04-24 NOTE — Progress Notes (Signed)
  Subjective:  Patient ID: Tonya Cruz, female    DOB: 08-18-1924,  MRN: [id number]  Chief Complaint  Patient presents with   debride    RFC -BL nails trimming -pt on elquis   87 y.o. female presents with the above complaint.  Denies new issues. Objective:  Physical Exam: warm, good capillary refill, nail exam onychomycosis of the toenails, no trophic changes or ulcerative lesions, normal DP and PT pulses, and normal sensory exam. Left Foot: normal exam, no swelling, tenderness, instability; ligaments intact, full range of motion of all ankle/foot joints  Right Foot: normal exam, no swelling, tenderness, instability; ligaments intact, full range of motion of all ankle/foot joints   No images are attached to the encounter.  Assessment:   1. Onychomycosis   2. Coagulation defect [facility])     Plan:  Patient was evaluated and treated and all questions answered.  Onychomycosis and Coagulation Defect  -Nails debrided as below  Procedure: Nail Debridement Type of Debridement: manual, sharp debridement. Instrumentation: Nail nipper, rotary burr. Number of Nails: 10       No follow-ups on file.

## 2021-05-09 ENCOUNTER — Telehealth: Payer: Self-pay | Admitting: Hematology and Oncology

## 2021-05-09 ENCOUNTER — Ambulatory Visit: Payer: Medicare Other | Admitting: Oncology

## 2021-05-09 ENCOUNTER — Inpatient Hospital Stay: Payer: Medicare Other | Attending: Oncology | Admitting: Hematology and Oncology

## 2021-05-09 ENCOUNTER — Other Ambulatory Visit: Payer: Self-pay

## 2021-05-09 VITALS — BP 198/86 | HR 63 | Temp 98.0°F | Resp 16 | Ht 65.0 in | Wt 166.7 lb

## 2021-05-09 DIAGNOSIS — C50411 Malignant neoplasm of upper-outer quadrant of right female breast: Secondary | ICD-10-CM | POA: Diagnosis not present

## 2021-05-09 NOTE — Progress Notes (Signed)
[facility]  [address] [place],  [place]  [address] [phone]  Clinic Day:  05/11/2021  Referring physician: Lowella Dandy, NP   CHIEF COMPLAINT:  CC:  Remote history of right breast cancer with changes on mammogram concerning for possible malignancy  Current Treatment:   Supportive care   HISTORY OF PRESENT ILLNESS:  Tonya Cruz is a 87 y.o. female with a history stage IA right breast cancer diagnosed in 2001 and treated with lumpectomy, followed by adjuvant radiation.  No sentinel lymph nodes were excised. She has had some chronic nipple inversion and discharge for years, as well as chronic swelling of the right breast.  We had not seen her since 2013 at which time mammogram and ultrasound at that time were negative.    We began seeing her again in February of this year when right diagnostic mammogram and ultrasound in January revealed possible intraductal mass versus dilated duct with debris at 10 o'clock in the right breast measuring 0.9 cm.  Biopsy of the breast and punch biopsy of the breast skin was recommended, but the patient refused. MRI breast was also recommended, but the patient is not interested in an aggressive evaluation or treatment.  Her main issue was worsening lymphedema of the right upper extremity, interfering with her ability to use the right arm. She also reported right shoulder pain, but she has a history of right rotator cuff repair on 2 occasions and left rotator cuff repair on 1 occasion.  She also has speech dystonia, exacerbated by anxiety and stress.  Bone density scan from September 2021 was normal other than osteopenia of the forearm, for which she is on Prolia.  INTERVAL HISTORY:  [name] is here today for repeat examination. She denies fevers or chills. She  has chronic post herpetic neuralgia of the left temple, as well as sharp shooting pains in her right hand.  She otherwise denies pain. Her appetite is fairly  good. Her weight has decreased 3 pounds over last 6 months . Her daughter accompanies her today and states that her primary care provider, Laverna Peace, NP, recently increased her thyroid medication.  REVIEW OF SYSTEMS:  Review of Systems  Constitutional:  Negative for appetite change, chills, fatigue, fever and unexpected weight change.  HENT:   Negative for lump/mass, mouth sores and sore throat.   Respiratory:  Negative for cough and shortness of breath.   Cardiovascular:  Negative for chest pain and leg swelling.  Gastrointestinal:  Negative for abdominal pain, constipation, diarrhea, nausea and vomiting.  Endocrine: Negative for hot flashes.  Genitourinary:  Negative for difficulty urinating, dysuria, frequency and hematuria.   Musculoskeletal:  Positive for arthralgias (right hand, intermittent shooting pain). Negative for back pain and myalgias.  Skin:  Negative for rash.  Neurological:  Negative for dizziness and headaches.  Hematological:  Negative for adenopathy. Does not bruise/bleed easily.  Psychiatric/Behavioral:  Negative for depression and sleep disturbance. The patient is not nervous/anxious.     VITALS:  Blood pressure (!) 198/86, pulse 63, temperature 98 F (36.7 C), resp. rate 16, height '5\' 5"'$  (1.651 m), weight 166 lb 11.2 oz (75.6 kg), SpO2 98 %.  Wt Readings from Last 3 Encounters:  05/09/21 166 lb 11.2 oz (75.6 kg)  11/09/20 169 lb 1.6 oz (76.7 kg)  07/22/20 170 lb (77.1 kg)    Body mass index is 27.74 kg/m.  Performance status (ECOG): 1 - Symptomatic but completely ambulatory  PHYSICAL EXAM:  Physical  Exam Vitals and nursing note reviewed.  Constitutional:      General: She is not in acute distress.    Appearance: Normal appearance.  HENT:     Head: Normocephalic and atraumatic.     Mouth/Throat:     Mouth: Mucous membranes are moist.     Pharynx: Oropharynx is clear. No oropharyngeal exudate or posterior oropharyngeal erythema.  Eyes:     General: No  scleral icterus.    Extraocular Movements: Extraocular movements intact.     Conjunctiva/sclera: Conjunctivae normal.     Pupils: Pupils are equal, round, and reactive to light.  Cardiovascular:     Rate and Rhythm: Normal rate and regular rhythm.     Heart sounds: Normal heart sounds. No murmur heard.   No friction rub. No gallop.  Pulmonary:     Effort: Pulmonary effort is normal.     Breath sounds: Normal breath sounds. No wheezing, rhonchi or rales.  Chest:  Breasts:    Right: Swelling (mild, stable of the upper right breast) and inverted nipple present. No bleeding, mass, nipple discharge, skin change, tenderness, axillary adenopathy or supraclavicular adenopathy.     Left: Normal. No axillary adenopathy or supraclavicular adenopathy.  Abdominal:     General: There is no distension.     Palpations: Abdomen is soft. There is no hepatomegaly, splenomegaly or mass.     Tenderness: There is no abdominal tenderness.  Musculoskeletal:        General: Swelling (right upper extremity lymphedema) present. Normal range of motion.     Cervical back: Normal range of motion and neck supple. No tenderness.     Right lower leg: No edema.     Left lower leg: No edema.  Lymphadenopathy:     Cervical: No cervical adenopathy.     Upper Body:     Right upper body: No supraclavicular or axillary adenopathy.     Left upper body: No supraclavicular or axillary adenopathy.     Lower Body: No right inguinal adenopathy. No left inguinal adenopathy.  Skin:    General: Skin is warm and dry.     Coloration: Skin is not jaundiced.     Findings: No rash.  Neurological:     Mental Status: She is alert and oriented to person, place, and time.     Cranial Nerves: No cranial nerve deficit.  Psychiatric:        Mood and Affect: Mood normal.        Behavior: Behavior normal.        Thought Content: Thought content normal.    LABS:   CBC Latest Ref Rng & Units 11/09/2020 07/06/2017 06/28/2017  WBC - 6.5 9.4  7.0  Hemoglobin 12.0 - 16.0 12.6 11.5(L) 11.0(L)  Hematocrit 36 - 46 39 35.3(L) 32.9(L)  Platelets 150 - 399 143(A) 168 193   CMP Latest Ref Rng & Units 11/09/2020 07/07/2017 07/06/2017  Glucose 65 - 99 mg/dL - 79 89  BUN 4 - '21 16 16 16  '$ Creatinine 0.5 - 1.1 1.1 1.15(H) 0.98  Sodium 137 - 147 139 129(L) 124(L)  Potassium 3.4 - 5.3 4.4 4.4 4.4  Chloride 99 - 108 104 99(L) 93(L)  CO2 13 - 22 27(A) 23 23  Calcium 8.7 - 10.7 8.6(A) 8.3(L) 8.8(L)     No results found for: CEA1 / No results found for: CEA1 No results found for: PSA1 No results found for: [id number] No results found for: CAN125  No results found for: TOTALPROTELP, ALBUMINELP,  A1GS, A2GS, BETS, BETA2SER, GAMS, MSPIKE, SPEI No results found for: TIBC, FERRITIN, IRONPCTSAT No results found for: LDH  STUDIES:  No results found.    HISTORY:   Past Medical History:  Diagnosis Date   Allergy    Anemia    pernicious   Anxiety    Arthritis    Breast cancer ([place])    right side   Coronary artery disease    06/11/17 PTCA/DES x1 to mLAD, CTO mRCA with col.    GERD (gastroesophageal reflux disease)    High cholesterol    History of myocardial infarction    Hypertension    NSTEMI (non-ST elevated myocardial infarction) ([place]) 06/11/2017   Osteoporosis    Paroxysmal atrial fibrillation (HCC)    Post herpetic neuralgia    Thyroid disease     Past Surgical History:  Procedure Laterality Date   BREAST SURGERY     CARDIAC CATHETERIZATION  06/11/2017   CORONARY STENT INTERVENTION N/A 06/11/2017   Procedure: CORONARY STENT INTERVENTION;  Surgeon: Burnell Blanks, MD;  Location: [facility];  Service: Cardiovascular;  Laterality: N/A;   LEFT HEART CATH AND CORONARY ANGIOGRAPHY N/A 06/11/2017   Procedure: LEFT HEART CATH AND CORONARY ANGIOGRAPHY;  Surgeon: Burnell Blanks, MD;  Location: [facility];  Service: Cardiovascular;  Laterality: N/A;   roto cuff     TONSILLECTOMY      Family History   Problem Relation Age of Onset   Diabetes Mother    Breast cancer Mother    Other Father        cerebral hemorrhage    Social History:  reports that she has quit smoking. She has never used smokeless tobacco. She reports that she does not drink alcohol and does not use drugs.The patient is accompanied by her daughter today.  Allergies:  Allergies  Allergen Reactions   Celexa [Citalopram Hydrobromide] Other (See Comments)    Low sodium   Penicillins     Has patient had a PCN reaction causing immediate rash, facial/tongue/throat swelling, SOB or lightheadedness with hypotension: No Has patient had a PCN reaction causing severe rash involving mucus membranes or skin necrosis: No Has patient had a PCN reaction that required hospitalization: No Has patient had a PCN reaction occurring within the last 10 years: No If all of the above answers are "NO", then may proceed with Cephalosporin use.   Ciprofloxacin Hcl Rash    Current Medications: Current Outpatient Medications  Medication Sig Dispense Refill   apixaban (ELIQUIS) 5 MG TABS tablet Take 1 tablet (5 mg total) by mouth 2 (two) times daily. 60 tablet 11   atorvastatin (LIPITOR) 40 MG tablet TAKE 1 TABLET(40 MG) BY MOUTH DAILY 90 tablet 1   buPROPion (WELLBUTRIN) 75 MG tablet Take 1 tablet (75 mg total) by mouth at bedtime. 90 tablet 1   carvedilol (COREG) 6.25 MG tablet TAKE 1 TABLET(6.25 MG) BY MOUTH TWICE DAILY WITH A MEAL 180 tablet 1   diltiazem (CARDIZEM CD) 120 MG 24 hr capsule Take 120 mg by mouth daily.     famciclovir (FAMVIR) 500 MG tablet Take 500 mg by mouth 3 (three) times daily.     furosemide (LASIX) 20 MG tablet Take one tablet by mouth daily Monday through Friday (Patient taking differently: Take one tablet by mouth daily Monday, Wednesday and Friday) 20 tablet 5   hydrALAZINE (APRESOLINE) 50 MG tablet Take 1 tablet (50 mg total) by mouth in the morning and at bedtime. 180 tablet  3   isosorbide mononitrate (IMDUR) 60  MG 24 hr tablet Take 1 tablet (60 mg total) by mouth daily. 90 tablet 2   levothyroxine (SYNTHROID) 100 MCG tablet Take 100 mcg by mouth daily.     levothyroxine (SYNTHROID) 88 MCG tablet Take 1 tablet (88 mcg total) by mouth daily. 90 tablet 0   LORazepam (ATIVAN) 1 MG tablet Take 1 tablet (1 mg total) by mouth at bedtime. . (Patient taking differently: Take 1 mg by mouth 3 times/day as needed-between meals & bedtime. [name]) 30 tablet 0   nitroGLYCERIN (NITROSTAT) 0.4 MG SL tablet Place under the tongue.     pantoprazole (PROTONIX) 40 MG tablet TAKE 1 TABLET(40 MG) BY MOUTH DAILY 90 tablet 0   pregabalin (LYRICA) 100 MG capsule TAKE 1 CAPSULE(100 MG) BY MOUTH TWICE DAILY 180 capsule 0   triamcinolone ointment (KENALOG) 0.1 % Apply topically 2 (two) times daily as needed.     No current facility-administered medications for this visit.     ASSESSMENT & PLAN:   Assessment:   1.  History of stage I right breast cancer, diagnosed in 2001, treated with lumpectomy and radiation therapy.  She has chronic skin/nipple changes and occasional nipple discharge.   2.  Possible intraductal mass versus dilated duct with debris at 10 o'clock in the right breast.  Biopsy has been recommended, but the patient has refused aggressive evaluation or treatment. This area is very small at 0.9 cm, so we plan to simply monitor.  We may repeat imaging at a later time.  We would not pursue surgery if this were cancer, but I mentioned that we could consider hormonal therapy with a daily pill, if it is estrogen sensitive.   3.  Lymphedema of the right upper extremity, which is her main complaint.She has seen the lymphedema specialist and has been wearing her sleeve with some improvement    4.  Osteopenia, for which she is on Prolia, with improvement.    Plan:   We will plan to see her back in 6 months for re-examination. The patient and her daughter understand the plans discussed today and are in agreement with them. The  known to contact our office if she develops concerns prior to her next appointment.     Marvia Pickles, PA-C

## 2021-05-09 NOTE — Telephone Encounter (Signed)
Per 8/2 los next appt scheduled and given to patient 

## 2021-05-10 DIAGNOSIS — H353131 Nonexudative age-related macular degeneration, bilateral, early dry stage: Secondary | ICD-10-CM | POA: Diagnosis not present

## 2021-05-11 ENCOUNTER — Encounter: Payer: Self-pay | Admitting: Hematology and Oncology

## 2021-05-22 DIAGNOSIS — M109 Gout, unspecified: Secondary | ICD-10-CM | POA: Diagnosis not present

## 2021-06-14 DIAGNOSIS — E039 Hypothyroidism, unspecified: Secondary | ICD-10-CM | POA: Diagnosis not present

## 2021-07-10 DIAGNOSIS — R300 Dysuria: Secondary | ICD-10-CM | POA: Diagnosis not present

## 2021-07-10 DIAGNOSIS — R519 Headache, unspecified: Secondary | ICD-10-CM | POA: Diagnosis not present

## 2021-07-10 DIAGNOSIS — E039 Hypothyroidism, unspecified: Secondary | ICD-10-CM | POA: Diagnosis not present

## 2021-07-10 DIAGNOSIS — Z23 Encounter for immunization: Secondary | ICD-10-CM | POA: Diagnosis not present

## 2021-07-10 DIAGNOSIS — K219 Gastro-esophageal reflux disease without esophagitis: Secondary | ICD-10-CM | POA: Diagnosis not present

## 2021-07-10 DIAGNOSIS — B0229 Other postherpetic nervous system involvement: Secondary | ICD-10-CM | POA: Diagnosis not present

## 2021-07-10 DIAGNOSIS — F418 Other specified anxiety disorders: Secondary | ICD-10-CM | POA: Diagnosis not present

## 2021-07-10 DIAGNOSIS — M810 Age-related osteoporosis without current pathological fracture: Secondary | ICD-10-CM | POA: Diagnosis not present

## 2021-07-10 DIAGNOSIS — I890 Lymphedema, not elsewhere classified: Secondary | ICD-10-CM | POA: Diagnosis not present

## 2021-07-10 DIAGNOSIS — Z6831 Body mass index (BMI) 31.0-31.9, adult: Secondary | ICD-10-CM | POA: Diagnosis not present

## 2021-07-10 DIAGNOSIS — I10 Essential (primary) hypertension: Secondary | ICD-10-CM | POA: Diagnosis not present

## 2021-07-10 DIAGNOSIS — I480 Paroxysmal atrial fibrillation: Secondary | ICD-10-CM | POA: Diagnosis not present

## 2021-07-13 DIAGNOSIS — I4891 Unspecified atrial fibrillation: Secondary | ICD-10-CM | POA: Diagnosis not present

## 2021-07-13 DIAGNOSIS — R0602 Shortness of breath: Secondary | ICD-10-CM | POA: Diagnosis not present

## 2021-07-13 DIAGNOSIS — Z7901 Long term (current) use of anticoagulants: Secondary | ICD-10-CM | POA: Diagnosis not present

## 2021-07-13 DIAGNOSIS — I480 Paroxysmal atrial fibrillation: Secondary | ICD-10-CM | POA: Diagnosis not present

## 2021-07-13 DIAGNOSIS — I10 Essential (primary) hypertension: Secondary | ICD-10-CM | POA: Diagnosis not present

## 2021-07-13 DIAGNOSIS — R079 Chest pain, unspecified: Secondary | ICD-10-CM | POA: Diagnosis not present

## 2021-07-13 DIAGNOSIS — B0229 Other postherpetic nervous system involvement: Secondary | ICD-10-CM | POA: Diagnosis not present

## 2021-07-13 DIAGNOSIS — R000 Tachycardia, unspecified: Secondary | ICD-10-CM | POA: Diagnosis not present

## 2021-07-13 DIAGNOSIS — R110 Nausea: Secondary | ICD-10-CM | POA: Diagnosis not present

## 2021-07-13 DIAGNOSIS — R0789 Other chest pain: Secondary | ICD-10-CM | POA: Diagnosis not present

## 2021-07-13 DIAGNOSIS — Z79899 Other long term (current) drug therapy: Secondary | ICD-10-CM | POA: Diagnosis not present

## 2021-07-18 DIAGNOSIS — E039 Hypothyroidism, unspecified: Secondary | ICD-10-CM | POA: Diagnosis not present

## 2021-07-18 DIAGNOSIS — I480 Paroxysmal atrial fibrillation: Secondary | ICD-10-CM | POA: Diagnosis not present

## 2021-07-18 DIAGNOSIS — M810 Age-related osteoporosis without current pathological fracture: Secondary | ICD-10-CM | POA: Diagnosis not present

## 2021-07-18 DIAGNOSIS — B0229 Other postherpetic nervous system involvement: Secondary | ICD-10-CM | POA: Diagnosis not present

## 2021-07-18 DIAGNOSIS — Z79899 Other long term (current) drug therapy: Secondary | ICD-10-CM | POA: Diagnosis not present

## 2021-07-18 DIAGNOSIS — R519 Headache, unspecified: Secondary | ICD-10-CM | POA: Diagnosis not present

## 2021-07-18 DIAGNOSIS — I10 Essential (primary) hypertension: Secondary | ICD-10-CM | POA: Diagnosis not present

## 2021-07-18 DIAGNOSIS — R079 Chest pain, unspecified: Secondary | ICD-10-CM | POA: Diagnosis not present

## 2021-07-20 ENCOUNTER — Telehealth: Payer: Self-pay | Admitting: Interventional Cardiology

## 2021-07-20 NOTE — Telephone Encounter (Signed)
Our office received an urgent referral via [name] for this Dr. Tamala Cruz patient. She is needing to be seen for a hospital follow up due to Chest Pain and Afib. Unsure if patient needs to be on DOD day due to lack of ability. Please advise.

## 2021-07-20 NOTE — Telephone Encounter (Signed)
Called daughter and scheduled appt for 10/17

## 2021-07-22 NOTE — Progress Notes (Signed)
Cardiology Office Note:    Date:  07/24/2021   ID:  ROSSELYN Cruz, DOB 08-05-1924, MRN [id number]  PCP:  Lowella Dandy, NP  Cardiologist:  None   Referring MD: Lowella Dandy, NP   Chief Complaint  Patient presents with   Coronary Artery Disease   Congestive Heart Failure    History of Present Illness:    Tonya Cruz is a 87 y.o. female with a hx of breast cancer, primary hypertension, hyperlipidemia, non-ST elevation MI 2018, chronically occluded RCA and stent mid LAD using DES, chronic anticoagulation with apixaban, and A. fib in December 2019.   She is here today wanting to know if she should be back on Lasix.  She had a recent UTI and Lasix was discontinued because she was having frequent urination.  She also has postherpetic neuralgia.  She has not been able to sleep well.  She was recently hospitalized at [facility] with a U TI.  Some adjustments in her medications were made.  Daughter states that she has noted her to be an eval of atrial fibrillation over the years.  She denies orthopnea, PND, lower extremity swelling, hand swelling.  Past Medical History:  Diagnosis Date   Allergy    Anemia    pernicious   Anxiety    Arthritis    Breast cancer ([place])    right side   Coronary artery disease    06/11/17 PTCA/DES x1 to mLAD, CTO mRCA with col.    GERD (gastroesophageal reflux disease)    High cholesterol    History of myocardial infarction    Hypertension    NSTEMI (non-ST elevated myocardial infarction) ([place]) 06/11/2017   Osteoporosis    Paroxysmal atrial fibrillation (HCC)    Post herpetic neuralgia    Thyroid disease     Past Surgical History:  Procedure Laterality Date   BREAST SURGERY     CARDIAC CATHETERIZATION  06/11/2017   CORONARY STENT INTERVENTION N/A 06/11/2017   Procedure: CORONARY STENT INTERVENTION;  Surgeon: Burnell Blanks, MD;  Location: [facility];  Service: Cardiovascular;  Laterality: N/A;   LEFT HEART CATH AND CORONARY  ANGIOGRAPHY N/A 06/11/2017   Procedure: LEFT HEART CATH AND CORONARY ANGIOGRAPHY;  Surgeon: Burnell Blanks, MD;  Location: [facility];  Service: Cardiovascular;  Laterality: N/A;   roto cuff     TONSILLECTOMY      Current Medications: Current Meds  Medication Sig   apixaban (ELIQUIS) 5 MG TABS tablet Take 1 tablet (5 mg total) by mouth 2 (two) times daily.   atorvastatin (LIPITOR) 40 MG tablet TAKE 1 TABLET(40 MG) BY MOUTH DAILY   buPROPion (WELLBUTRIN) 75 MG tablet Take 1 tablet (75 mg total) by mouth at bedtime.   carvedilol (COREG) 6.25 MG tablet TAKE 1 TABLET(6.25 MG) BY MOUTH TWICE DAILY WITH A MEAL   cyanocobalamin (,VITAMIN B-12,) 1000 MCG/ML injection Inject into the muscle every 30 (thirty) days.   diltiazem (CARDIZEM CD) 240 MG 24 hr capsule Take 1 capsule (240 mg total) by mouth daily.   famciclovir (FAMVIR) 500 MG tablet Take 500 mg by mouth 3 (three) times daily.   hydrALAZINE (APRESOLINE) 50 MG tablet Take 1 tablet (50 mg total) by mouth in the morning and at bedtime.   isosorbide mononitrate (IMDUR) 60 MG 24 hr tablet Take 1 tablet (60 mg total) by mouth daily.   levothyroxine (SYNTHROID) 100 MCG tablet Take 100 mcg by mouth daily.   LORazepam (ATIVAN) 1 MG  tablet Take 1 tablet (1 mg total) by mouth at bedtime. . (Patient taking differently: Take 1 mg by mouth 3 times/day as needed-between meals & bedtime. [name])   nitroGLYCERIN (NITROSTAT) 0.4 MG SL tablet Place under the tongue.   pantoprazole (PROTONIX) 40 MG tablet TAKE 1 TABLET(40 MG) BY MOUTH DAILY   predniSONE (DELTASONE) 10 MG tablet Take 10 mg by mouth daily with breakfast. Pt take 10 mg daily, and taper off on 07/26/21  and start taking 5 mg once a day   pregabalin (LYRICA) 75 MG capsule Take 75 mg by mouth 3 (three) times daily.   [DISCONTINUED] diltiazem (CARDIZEM CD) 120 MG 24 hr capsule Take 120 mg by mouth daily.   [DISCONTINUED] furosemide (LASIX) 20 MG tablet Take one tablet by mouth daily Monday  through Friday (Patient taking differently: Take one tablet by mouth daily Monday, Wednesday and Friday)     Allergies:   Celexa [citalopram hydrobromide], Penicillins, and Ciprofloxacin hcl   Social History   Socioeconomic History   Marital status: Widowed    Spouse name: Not on file   Number of children: Not on file   Years of education: Not on file   Highest education level: Not on file  Occupational History   Not on file  Tobacco Use   Smoking status: Former   Smokeless tobacco: Never  Vaping Use   Vaping Use: Never used  Substance and Sexual Activity   Alcohol use: No   Drug use: No   Sexual activity: Not Currently  Other Topics Concern   Not on file  Social History Narrative   Not on file   Social Determinants of Health   Financial Resource Strain: Not on file  Food Insecurity: Not on file  Transportation Needs: Not on file  Physical Activity: Not on file  Stress: Not on file  Social Connections: Not on file     Family History: The patient's family history includes Breast cancer in her mother; Diabetes in her mother; Other in her father.  ROS:   Please see the history of present illness.    Says she feels weak.  Still with frequent urination.  Thyroid is been out of control.  All other systems reviewed and are negative.  EKGs/Labs/Other Studies Reviewed:    The following studies were reviewed today: Echo at [facility] 09/15/2020 demonstrated normal systolic function with abnormal diastolic parameters  EKG:  EKG A. fib with poor ventricular rate control with heart rate of 97 bpm.  Fib is new when compared to the last tracing done July 25, 2020.  Recent Labs: 11/09/2020: BUN 16; Creatinine 1.1; Hemoglobin 12.6; Platelets 143; Potassium 4.4; Sodium 139  Recent Lipid Panel    Component Value Date/Time   CHOL 112 06/09/2017 0400   TRIG 99 06/09/2017 0400   HDL 40 (L) 06/09/2017 0400   CHOLHDL 2.8 06/09/2017 0400   VLDL 20 06/09/2017 0400   LDLCALC 52  06/09/2017 0400    Physical Exam:    VS:  BP (!) 150/60   Pulse 97   Ht 5\' 5"  (1.651 m)   Wt 163 lb 3.2 oz (74 kg)   SpO2 98%   BMI 27.16 kg/m     Wt Readings from Last 3 Encounters:  07/24/21 163 lb 3.2 oz (74 kg)  05/09/21 166 lb 11.2 oz (75.6 kg)  11/09/20 169 lb 1.6 oz (76.7 kg)     GEN: Compatible with age. No acute distress HEENT: Normal NECK: No JVD. LYMPHATICS: No lymphadenopathy  CARDIAC: Left apical systolic murmur. IIRR no gallop, or edema. VASCULAR:  Normal Pulses. No bruits. RESPIRATORY:  Clear to auscultation without rales, wheezing or rhonchi  ABDOMEN: Soft, non-tender, non-distended, No pulsatile mass, MUSCULOSKELETAL: No deformity  SKIN: Warm and dry NEUROLOGIC:  Alert and oriented x 3 PSYCHIATRIC:  Normal affect   ASSESSMENT:    1. Coronary artery disease of native artery of native heart with stable angina pectoris ([place])   2. Acute on chronic diastolic heart failure (HCC)   3. Paroxysmal atrial fibrillation ([place])   4. Essential hypertension   5. Mixed hyperlipidemia   6. Anticoagulation adequate    PLAN:    In order of problems listed above:  No ischemic symptoms. Currently in an atrial fib with poor rate control.  Increase diltiazem to 240 mg daily.  Check BNP to rule out heart failure.  Suspect some shortness of breath is related to acute diastolic dysfunction related to A. fib.  I have advised her to resume furosemide 20 mg on Monday Wednesday and Friday.  Notify us if low blood pressure or feels worse.  Clinical follow-up in 3 to 4 months.  Earlier if increasing shortness of breath. Currently in A. fib with poor rate control.  Increase diltiazem to 240 mg/day. Increasing diltiazem will better control blood pressure. Continue Lipitor Continue Eliquis   Medication Adjustments/Labs and Tests Ordered: Current medicines are reviewed at length with the patient today.  Concerns regarding medicines are outlined above.  Orders Placed This Encounter   Procedures   Pro b natriuretic peptide   EKG 12-Lead   Meds ordered this encounter  Medications   furosemide (LASIX) 20 MG tablet    Sig: Take one tablet by mouth daily Monday, Wednesday and Friday    Dispense:  45 tablet    Refill:  1    Dose change   diltiazem (CARDIZEM CD) 240 MG 24 hr capsule    Sig: Take 1 capsule (240 mg total) by mouth daily.    Dispense:  90 capsule    Refill:  3    Dose change    Patient Instructions  Medication Instructions:  1) INCREASE Diltiazem to 240mg  once daily 2) RESTART Furosemide at 20mg  once daily on Monday, Wednesday and Friday.  *If you need a refill on your cardiac medications before your next appointment, please call your pharmacy*   Lab Work: Pro BNP today  If you have labs (blood work) drawn today and your tests are completely normal, you will receive your results only by: [place] (if you have MyChart) OR A paper copy in the mail If you have any lab test that is abnormal or we need to change your treatment, we will call you to review the results.   Testing/Procedures: None   Follow-Up: At [facility], you and your health needs are our priority.  As part of our continuing mission to provide you with exceptional heart care, we have created designated Provider Care Teams.  These Care Teams include your primary Cardiologist (physician) and Advanced Practice Providers (APPs -  Physician Assistants and Nurse Practitioners) who all work together to provide you with the care you need, when you need it.  We recommend signing up for the patient portal called "MyChart".  Sign up information is provided on this After Visit Summary.  MyChart is used to connect with patients for Virtual Visits (Telemedicine).  Patients are able to view lab/test results, encounter notes, upcoming appointments, etc.  Non-urgent messages can be sent to  your provider as well.   To learn more about what you can do with MyChart, go to  [email or web address].    Your next appointment:   3 month(s)  The format for your next appointment:   In Person  Provider:   You may see Daneen Schick, III, MD or one of the following Advanced Practice Providers on your designated Care Team:   Cecilie Kicks, NP   Other Instructions     Signed, Sinclair Grooms, MD  07/24/2021 2:17 PM    [place]

## 2021-07-24 ENCOUNTER — Other Ambulatory Visit: Payer: Self-pay

## 2021-07-24 ENCOUNTER — Ambulatory Visit (INDEPENDENT_AMBULATORY_CARE_PROVIDER_SITE_OTHER): Payer: Medicare Other | Admitting: Interventional Cardiology

## 2021-07-24 ENCOUNTER — Encounter: Payer: Self-pay | Admitting: Interventional Cardiology

## 2021-07-24 VITALS — BP 150/60 | HR 97 | Ht 65.0 in | Wt 163.2 lb

## 2021-07-24 DIAGNOSIS — I480 Paroxysmal atrial fibrillation: Secondary | ICD-10-CM | POA: Diagnosis not present

## 2021-07-24 DIAGNOSIS — E782 Mixed hyperlipidemia: Secondary | ICD-10-CM

## 2021-07-24 DIAGNOSIS — I10 Essential (primary) hypertension: Secondary | ICD-10-CM

## 2021-07-24 DIAGNOSIS — I25118 Atherosclerotic heart disease of native coronary artery with other forms of angina pectoris: Secondary | ICD-10-CM | POA: Diagnosis not present

## 2021-07-24 DIAGNOSIS — I5033 Acute on chronic diastolic (congestive) heart failure: Secondary | ICD-10-CM

## 2021-07-24 DIAGNOSIS — Z7901 Long term (current) use of anticoagulants: Secondary | ICD-10-CM | POA: Diagnosis not present

## 2021-07-24 MED ORDER — FUROSEMIDE 20 MG PO TABS: ORAL_TABLET | ORAL | 1 refills | Status: DC

## 2021-07-24 MED ORDER — DILTIAZEM HCL ER COATED BEADS 240 MG PO CP24: 240.0000 mg | ORAL_CAPSULE | Freq: Every day | ORAL | 3 refills | Status: AC

## 2021-07-24 NOTE — Patient Instructions (Signed)
Medication Instructions:  1) INCREASE Diltiazem to 240mg  once daily 2) RESTART Furosemide at 20mg  once daily on Monday, Wednesday and Friday.  *If you need a refill on your cardiac medications before your next appointment, please call your pharmacy*   Lab Work: Pro BNP today  If you have labs (blood work) drawn today and your tests are completely normal, you will receive your results only by: [place] (if you have MyChart) OR A paper copy in the mail If you have any lab test that is abnormal or we need to change your treatment, we will call you to review the results.   Testing/Procedures: None   Follow-Up: At [facility], you and your health needs are our priority.  As part of our continuing mission to provide you with exceptional heart care, we have created designated Provider Care Teams.  These Care Teams include your primary Cardiologist (physician) and Advanced Practice Providers (APPs -  Physician Assistants and Nurse Practitioners) who all work together to provide you with the care you need, when you need it.  We recommend signing up for the patient portal called "MyChart".  Sign up information is provided on this After Visit Summary.  MyChart is used to connect with patients for Virtual Visits (Telemedicine).  Patients are able to view lab/test results, encounter notes, upcoming appointments, etc.  Non-urgent messages can be sent to your provider as well.   To learn more about what you can do with MyChart, go to [email or web address].    Your next appointment:   3 month(s)  The format for your next appointment:   In Person  Provider:   You may see Daneen Schick, III, MD or one of the following Advanced Practice Providers on your designated Care Team:   Cecilie Kicks, NP   Other Instructions

## 2021-07-25 LAB — PRO B NATRIURETIC PEPTIDE: NT-Pro BNP: 4914 pg/mL — ABNORMAL HIGH (ref 0–738)

## 2021-07-26 DIAGNOSIS — Z1331 Encounter for screening for depression: Secondary | ICD-10-CM | POA: Diagnosis not present

## 2021-07-26 DIAGNOSIS — Z139 Encounter for screening, unspecified: Secondary | ICD-10-CM | POA: Diagnosis not present

## 2021-07-26 DIAGNOSIS — E669 Obesity, unspecified: Secondary | ICD-10-CM | POA: Diagnosis not present

## 2021-07-26 DIAGNOSIS — E785 Hyperlipidemia, unspecified: Secondary | ICD-10-CM | POA: Diagnosis not present

## 2021-07-26 DIAGNOSIS — Z0000 Encounter for general adult medical examination without abnormal findings: Secondary | ICD-10-CM | POA: Diagnosis not present

## 2021-07-26 DIAGNOSIS — Z9181 History of falling: Secondary | ICD-10-CM | POA: Diagnosis not present

## 2021-07-26 DIAGNOSIS — Z6831 Body mass index (BMI) 31.0-31.9, adult: Secondary | ICD-10-CM | POA: Diagnosis not present

## 2021-07-31 ENCOUNTER — Other Ambulatory Visit: Payer: Self-pay

## 2021-07-31 ENCOUNTER — Ambulatory Visit (INDEPENDENT_AMBULATORY_CARE_PROVIDER_SITE_OTHER): Payer: Medicare Other | Admitting: Podiatry

## 2021-07-31 DIAGNOSIS — D689 Coagulation defect, unspecified: Secondary | ICD-10-CM

## 2021-07-31 DIAGNOSIS — B351 Tinea unguium: Secondary | ICD-10-CM | POA: Diagnosis not present

## 2021-07-31 NOTE — Progress Notes (Signed)
  Subjective:  Patient ID: Tonya Cruz, female    DOB: [birth year],  MRN: [id number]  Chief Complaint  Patient presents with   debride    RFC -pt on eliquis   87 y.o. female presents with the above complaint.  Denies new issues. Objective:  Physical Exam: warm, good capillary refill, nail exam onychomycosis of the toenails, no trophic changes or ulcerative lesions, normal DP and PT pulses, and normal sensory exam. Left Foot: normal exam, no swelling, tenderness, instability; ligaments intact, full range of motion of all ankle/foot joints  Right Foot: normal exam, no swelling, tenderness, instability; ligaments intact, full range of motion of all ankle/foot joints   No images are attached to the encounter.  Assessment:   1. Onychomycosis   2. Coagulation defect [facility])    Plan:  Patient was evaluated and treated and all questions answered.  Onychomycosis and Coagulation Defect  -Nails debrided as below  Procedure: Nail Debridement Type of Debridement: manual, sharp debridement. Instrumentation: Nail nipper, rotary burr. Number of Nails: 10   No follow-ups on file.

## 2021-08-03 DIAGNOSIS — M810 Age-related osteoporosis without current pathological fracture: Secondary | ICD-10-CM | POA: Diagnosis not present

## 2021-08-03 DIAGNOSIS — E039 Hypothyroidism, unspecified: Secondary | ICD-10-CM | POA: Diagnosis not present

## 2021-08-03 DIAGNOSIS — I10 Essential (primary) hypertension: Secondary | ICD-10-CM | POA: Diagnosis not present

## 2021-08-08 DIAGNOSIS — R519 Headache, unspecified: Secondary | ICD-10-CM | POA: Diagnosis not present

## 2021-08-08 DIAGNOSIS — E039 Hypothyroidism, unspecified: Secondary | ICD-10-CM | POA: Diagnosis not present

## 2021-08-08 DIAGNOSIS — B0229 Other postherpetic nervous system involvement: Secondary | ICD-10-CM | POA: Diagnosis not present

## 2021-08-08 DIAGNOSIS — Z6831 Body mass index (BMI) 31.0-31.9, adult: Secondary | ICD-10-CM | POA: Diagnosis not present

## 2021-08-08 DIAGNOSIS — M810 Age-related osteoporosis without current pathological fracture: Secondary | ICD-10-CM | POA: Diagnosis not present

## 2021-08-08 DIAGNOSIS — F418 Other specified anxiety disorders: Secondary | ICD-10-CM | POA: Diagnosis not present

## 2021-08-08 DIAGNOSIS — I480 Paroxysmal atrial fibrillation: Secondary | ICD-10-CM | POA: Diagnosis not present

## 2021-08-16 ENCOUNTER — Emergency Department (HOSPITAL_COMMUNITY): Payer: Medicare Other

## 2021-08-16 ENCOUNTER — Other Ambulatory Visit: Payer: Self-pay

## 2021-08-16 ENCOUNTER — Inpatient Hospital Stay (HOSPITAL_COMMUNITY)
Admission: EM | Admit: 2021-08-16 | Discharge: 2021-08-21 | DRG: 291 | Disposition: A | Discharge status: Skilled Nursing Facility | Payer: Medicare Other | Attending: Internal Medicine | Admitting: Internal Medicine

## 2021-08-16 ENCOUNTER — Encounter (HOSPITAL_COMMUNITY): Payer: Self-pay | Admitting: Internal Medicine

## 2021-08-16 DIAGNOSIS — G4700 Insomnia, unspecified: Secondary | ICD-10-CM | POA: Diagnosis present

## 2021-08-16 DIAGNOSIS — Z923 Personal history of irradiation: Secondary | ICD-10-CM | POA: Diagnosis not present

## 2021-08-16 DIAGNOSIS — N390 Urinary tract infection, site not specified: Secondary | ICD-10-CM | POA: Diagnosis present

## 2021-08-16 DIAGNOSIS — J90 Pleural effusion, not elsewhere classified: Secondary | ICD-10-CM | POA: Diagnosis not present

## 2021-08-16 DIAGNOSIS — Z880 Allergy status to penicillin: Secondary | ICD-10-CM

## 2021-08-16 DIAGNOSIS — D649 Anemia, unspecified: Secondary | ICD-10-CM | POA: Diagnosis present

## 2021-08-16 DIAGNOSIS — R0602 Shortness of breath: Secondary | ICD-10-CM | POA: Diagnosis not present

## 2021-08-16 DIAGNOSIS — R2689 Other abnormalities of gait and mobility: Secondary | ICD-10-CM | POA: Diagnosis not present

## 2021-08-16 DIAGNOSIS — Z66 Do not resuscitate: Secondary | ICD-10-CM | POA: Diagnosis present

## 2021-08-16 DIAGNOSIS — K219 Gastro-esophageal reflux disease without esophagitis: Secondary | ICD-10-CM | POA: Diagnosis present

## 2021-08-16 DIAGNOSIS — Z79890 Hormone replacement therapy (postmenopausal): Secondary | ICD-10-CM

## 2021-08-16 DIAGNOSIS — J9601 Acute respiratory failure with hypoxia: Secondary | ICD-10-CM | POA: Diagnosis present

## 2021-08-16 DIAGNOSIS — I4891 Unspecified atrial fibrillation: Secondary | ICD-10-CM | POA: Diagnosis not present

## 2021-08-16 DIAGNOSIS — M6281 Muscle weakness (generalized): Secondary | ICD-10-CM | POA: Diagnosis not present

## 2021-08-16 DIAGNOSIS — N179 Acute kidney failure, unspecified: Secondary | ICD-10-CM

## 2021-08-16 DIAGNOSIS — Z87891 Personal history of nicotine dependence: Secondary | ICD-10-CM | POA: Diagnosis not present

## 2021-08-16 DIAGNOSIS — I4821 Permanent atrial fibrillation: Secondary | ICD-10-CM | POA: Diagnosis not present

## 2021-08-16 DIAGNOSIS — I110 Hypertensive heart disease with heart failure: Principal | ICD-10-CM | POA: Diagnosis present

## 2021-08-16 DIAGNOSIS — I2510 Atherosclerotic heart disease of native coronary artery without angina pectoris: Secondary | ICD-10-CM | POA: Diagnosis present

## 2021-08-16 DIAGNOSIS — R140 Abdominal distension (gaseous): Secondary | ICD-10-CM

## 2021-08-16 DIAGNOSIS — Z7901 Long term (current) use of anticoagulants: Secondary | ICD-10-CM

## 2021-08-16 DIAGNOSIS — I480 Paroxysmal atrial fibrillation: Secondary | ICD-10-CM | POA: Diagnosis present

## 2021-08-16 DIAGNOSIS — J383 Other diseases of vocal cords: Secondary | ICD-10-CM | POA: Diagnosis present

## 2021-08-16 DIAGNOSIS — Z20822 Contact with and (suspected) exposure to covid-19: Secondary | ICD-10-CM | POA: Diagnosis present

## 2021-08-16 DIAGNOSIS — E039 Hypothyroidism, unspecified: Secondary | ICD-10-CM | POA: Diagnosis present

## 2021-08-16 DIAGNOSIS — F419 Anxiety disorder, unspecified: Secondary | ICD-10-CM | POA: Diagnosis present

## 2021-08-16 DIAGNOSIS — Z7401 Bed confinement status: Secondary | ICD-10-CM | POA: Diagnosis not present

## 2021-08-16 DIAGNOSIS — M810 Age-related osteoporosis without current pathological fracture: Secondary | ICD-10-CM | POA: Diagnosis not present

## 2021-08-16 DIAGNOSIS — Z881 Allergy status to other antibiotic agents status: Secondary | ICD-10-CM

## 2021-08-16 DIAGNOSIS — J9811 Atelectasis: Secondary | ICD-10-CM | POA: Diagnosis not present

## 2021-08-16 DIAGNOSIS — Z803 Family history of malignant neoplasm of breast: Secondary | ICD-10-CM | POA: Diagnosis not present

## 2021-08-16 DIAGNOSIS — J918 Pleural effusion in other conditions classified elsewhere: Secondary | ICD-10-CM | POA: Diagnosis not present

## 2021-08-16 DIAGNOSIS — I252 Old myocardial infarction: Secondary | ICD-10-CM | POA: Diagnosis not present

## 2021-08-16 DIAGNOSIS — B0229 Other postherpetic nervous system involvement: Secondary | ICD-10-CM | POA: Diagnosis present

## 2021-08-16 DIAGNOSIS — Z853 Personal history of malignant neoplasm of breast: Secondary | ICD-10-CM

## 2021-08-16 DIAGNOSIS — K7689 Other specified diseases of liver: Secondary | ICD-10-CM | POA: Diagnosis not present

## 2021-08-16 DIAGNOSIS — R531 Weakness: Secondary | ICD-10-CM | POA: Diagnosis not present

## 2021-08-16 DIAGNOSIS — Z888 Allergy status to other drugs, medicaments and biological substances status: Secondary | ICD-10-CM

## 2021-08-16 DIAGNOSIS — J385 Laryngeal spasm: Secondary | ICD-10-CM | POA: Diagnosis present

## 2021-08-16 DIAGNOSIS — F32A Depression, unspecified: Secondary | ICD-10-CM | POA: Diagnosis present

## 2021-08-16 DIAGNOSIS — Z9011 Acquired absence of right breast and nipple: Secondary | ICD-10-CM

## 2021-08-16 DIAGNOSIS — R001 Bradycardia, unspecified: Secondary | ICD-10-CM | POA: Diagnosis not present

## 2021-08-16 DIAGNOSIS — G609 Hereditary and idiopathic neuropathy, unspecified: Secondary | ICD-10-CM | POA: Diagnosis not present

## 2021-08-16 DIAGNOSIS — I10 Essential (primary) hypertension: Secondary | ICD-10-CM | POA: Diagnosis not present

## 2021-08-16 DIAGNOSIS — Z79899 Other long term (current) drug therapy: Secondary | ICD-10-CM

## 2021-08-16 DIAGNOSIS — E785 Hyperlipidemia, unspecified: Secondary | ICD-10-CM | POA: Diagnosis present

## 2021-08-16 DIAGNOSIS — I5033 Acute on chronic diastolic (congestive) heart failure: Secondary | ICD-10-CM | POA: Diagnosis present

## 2021-08-16 DIAGNOSIS — N281 Cyst of kidney, acquired: Secondary | ICD-10-CM | POA: Diagnosis not present

## 2021-08-16 DIAGNOSIS — Z634 Disappearance and death of family member: Secondary | ICD-10-CM

## 2021-08-16 DIAGNOSIS — F339 Major depressive disorder, recurrent, unspecified: Secondary | ICD-10-CM | POA: Diagnosis not present

## 2021-08-16 DIAGNOSIS — I517 Cardiomegaly: Secondary | ICD-10-CM | POA: Diagnosis not present

## 2021-08-16 DIAGNOSIS — M2550 Pain in unspecified joint: Secondary | ICD-10-CM | POA: Diagnosis not present

## 2021-08-16 HISTORY — DX: Unspecified diastolic (congestive) heart failure: I50.30

## 2021-08-16 LAB — URINALYSIS, ROUTINE W REFLEX MICROSCOPIC
Bilirubin Urine: NEGATIVE
Glucose, UA: NEGATIVE mg/dL
Ketones, ur: NEGATIVE mg/dL
Nitrite: POSITIVE — AB
Protein, ur: NEGATIVE mg/dL
Specific Gravity, Urine: 1.026 (ref 1.005–1.030)
pH: 5 (ref 5.0–8.0)

## 2021-08-16 LAB — CBC WITH DIFFERENTIAL/PLATELET
Abs Immature Granulocytes: 0.08 10*3/uL — ABNORMAL HIGH (ref 0.00–0.07)
Basophils Absolute: 0 10*3/uL (ref 0.0–0.1)
Basophils Relative: 0 %
Eosinophils Absolute: 0 10*3/uL (ref 0.0–0.5)
Eosinophils Relative: 0 %
HCT: 33.3 % — ABNORMAL LOW (ref 36.0–46.0)
Hemoglobin: 10.4 g/dL — ABNORMAL LOW (ref 12.0–15.0)
Immature Granulocytes: 1 %
Lymphocytes Relative: 3 %
Lymphs Abs: 0.3 10*3/uL — ABNORMAL LOW (ref 0.7–4.0)
MCH: 27.2 pg (ref 26.0–34.0)
MCHC: 31.2 g/dL (ref 30.0–36.0)
MCV: 87.2 fL (ref 80.0–100.0)
Monocytes Absolute: 0.8 10*3/uL (ref 0.1–1.0)
Monocytes Relative: 7 %
Neutro Abs: 9.2 10*3/uL — ABNORMAL HIGH (ref 1.7–7.7)
Neutrophils Relative %: 89 %
Platelets: 209 10*3/uL (ref 150–400)
RBC: 3.82 MIL/uL — ABNORMAL LOW (ref 3.87–5.11)
RDW: 16.4 % — ABNORMAL HIGH (ref 11.5–15.5)
WBC: 10.4 10*3/uL (ref 4.0–10.5)
nRBC: 0 % (ref 0.0–0.2)

## 2021-08-16 LAB — COMPREHENSIVE METABOLIC PANEL
ALT: 15 U/L (ref 0–44)
AST: 23 U/L (ref 15–41)
Albumin: 2.6 g/dL — ABNORMAL LOW (ref 3.5–5.0)
Alkaline Phosphatase: 106 U/L (ref 38–126)
Anion gap: 10 (ref 5–15)
BUN: 33 mg/dL — ABNORMAL HIGH (ref 8–23)
CO2: 26 mmol/L (ref 22–32)
Calcium: 7.8 mg/dL — ABNORMAL LOW (ref 8.9–10.3)
Chloride: 98 mmol/L (ref 98–111)
Creatinine, Ser: 1.47 mg/dL — ABNORMAL HIGH (ref 0.44–1.00)
GFR, Estimated: 33 mL/min — ABNORMAL LOW (ref >60)
Glucose, Bld: 154 mg/dL — ABNORMAL HIGH (ref 70–99)
Potassium: 4.2 mmol/L (ref 3.5–5.1)
Sodium: 134 mmol/L — ABNORMAL LOW (ref 135–145)
Total Bilirubin: 0.7 mg/dL (ref 0.3–1.2)
Total Protein: 5.5 g/dL — ABNORMAL LOW (ref 6.5–8.1)

## 2021-08-16 LAB — RESP PANEL BY RT-PCR (FLU A&B, COVID) ARPGX2
Influenza A by PCR: NEGATIVE
Influenza B by PCR: NEGATIVE
SARS Coronavirus 2 by RT PCR: NEGATIVE

## 2021-08-16 LAB — TROPONIN I (HIGH SENSITIVITY)
Troponin I (High Sensitivity): 17 ng/L (ref <18)
Troponin I (High Sensitivity): 12 ng/L (ref <18)

## 2021-08-16 LAB — BRAIN NATRIURETIC PEPTIDE: B Natriuretic Peptide: 626.1 pg/mL — ABNORMAL HIGH (ref 0.0–100.0)

## 2021-08-16 MED ORDER — ACETAMINOPHEN 650 MG RE SUPP: 650.0000 mg | Freq: Four times a day (QID) | RECTAL | Status: DC | PRN

## 2021-08-16 MED ORDER — LORAZEPAM 1 MG PO TABS
0.5000 mg | ORAL_TABLET | Freq: Every day | ORAL | Status: DC
  Administered 2021-08-16: 0.5 mg via ORAL
  Filled 2021-08-16: qty 1

## 2021-08-16 MED ORDER — APIXABAN 5 MG PO TABS: 5.0000 mg | ORAL_TABLET | Freq: Two times a day (BID) | ORAL | Status: DC

## 2021-08-16 MED ORDER — BUPROPION HCL 75 MG PO TABS
75.0000 mg | ORAL_TABLET | Freq: Every day | ORAL | Status: DC
  Administered 2021-08-16: 75 mg via ORAL
  Filled 2021-08-16 (×2): qty 1

## 2021-08-16 MED ORDER — POLYETHYLENE GLYCOL 3350 17 G PO PACK: 17.0000 g | PACK | Freq: Every day | ORAL | Status: DC | PRN

## 2021-08-16 MED ORDER — ATORVASTATIN CALCIUM 40 MG PO TABS
40.0000 mg | ORAL_TABLET | Freq: Every day | ORAL | Status: DC
  Administered 2021-08-17 – 2021-08-21 (×5): 40 mg via ORAL
  Filled 2021-08-16 (×5): qty 1

## 2021-08-16 MED ORDER — DILTIAZEM HCL ER COATED BEADS 240 MG PO CP24
240.0000 mg | ORAL_CAPSULE | Freq: Every day | ORAL | Status: DC
  Administered 2021-08-17 – 2021-08-21 (×5): 240 mg via ORAL
  Filled 2021-08-16 (×6): qty 1

## 2021-08-16 MED ORDER — ISOSORBIDE MONONITRATE ER 60 MG PO TB24
60.0000 mg | ORAL_TABLET | Freq: Every day | ORAL | Status: DC
  Administered 2021-08-17 – 2021-08-21 (×5): 60 mg via ORAL
  Filled 2021-08-16 (×5): qty 1

## 2021-08-16 MED ORDER — ACETAMINOPHEN 325 MG PO TABS
650.0000 mg | ORAL_TABLET | Freq: Four times a day (QID) | ORAL | Status: DC | PRN
  Administered 2021-08-17 – 2021-08-18 (×2): 650 mg via ORAL
  Filled 2021-08-16 (×2): qty 2

## 2021-08-16 MED ORDER — CARVEDILOL 6.25 MG PO TABS
6.2500 mg | ORAL_TABLET | Freq: Two times a day (BID) | ORAL | Status: DC
  Administered 2021-08-17 – 2021-08-21 (×11): 6.25 mg via ORAL
  Filled 2021-08-16: qty 1
  Filled 2021-08-16: qty 2
  Filled 2021-08-16 (×8): qty 1
  Filled 2021-08-16: qty 2

## 2021-08-16 MED ORDER — APIXABAN 2.5 MG PO TABS
2.5000 mg | ORAL_TABLET | Freq: Two times a day (BID) | ORAL | Status: DC
  Administered 2021-08-17 – 2021-08-18 (×2): 2.5 mg via ORAL
  Filled 2021-08-16 (×2): qty 1

## 2021-08-16 MED ORDER — PANTOPRAZOLE SODIUM 40 MG PO TBEC
40.0000 mg | DELAYED_RELEASE_TABLET | Freq: Every day | ORAL | Status: DC
  Administered 2021-08-17 – 2021-08-21 (×5): 40 mg via ORAL
  Filled 2021-08-16 (×5): qty 1

## 2021-08-16 MED ORDER — SODIUM CHLORIDE 0.9 % IV SOLN
1.0000 g | Freq: Once | INTRAVENOUS | Status: AC
  Administered 2021-08-16: 1 g via INTRAVENOUS
  Filled 2021-08-16: qty 10

## 2021-08-16 MED ORDER — IOHEXOL 350 MG/ML SOLN
100.0000 mL | Freq: Once | INTRAVENOUS | Status: AC | PRN
  Administered 2021-08-16: 100 mL via INTRAVENOUS

## 2021-08-16 MED ORDER — FUROSEMIDE 10 MG/ML IJ SOLN
40.0000 mg | Freq: Once | INTRAMUSCULAR | Status: AC
  Administered 2021-08-16: 40 mg via INTRAVENOUS
  Filled 2021-08-16: qty 4

## 2021-08-16 MED ORDER — PREGABALIN 75 MG PO CAPS
75.0000 mg | ORAL_CAPSULE | Freq: Three times a day (TID) | ORAL | Status: DC
  Administered 2021-08-16 – 2021-08-21 (×15): 75 mg via ORAL
  Filled 2021-08-16 (×6): qty 1
  Filled 2021-08-16: qty 3
  Filled 2021-08-16 (×8): qty 1

## 2021-08-16 MED ORDER — SODIUM CHLORIDE 0.9 % IV BOLUS
1000.0000 mL | Freq: Once | INTRAVENOUS | Status: AC
  Administered 2021-08-16: 1000 mL via INTRAVENOUS

## 2021-08-16 MED ORDER — SENNA 8.6 MG PO TABS
1.0000 | ORAL_TABLET | Freq: Two times a day (BID) | ORAL | Status: DC
  Administered 2021-08-16 – 2021-08-21 (×10): 8.6 mg via ORAL
  Filled 2021-08-16 (×10): qty 1

## 2021-08-16 MED ORDER — LEVOTHYROXINE SODIUM 100 MCG PO TABS
100.0000 ug | ORAL_TABLET | Freq: Every day | ORAL | Status: DC
  Administered 2021-08-17 – 2021-08-21 (×5): 100 ug via ORAL
  Filled 2021-08-16 (×5): qty 1

## 2021-08-16 NOTE — H&P (Addendum)
Date: 08/17/2021               Patient Name:  Tonya Cruz MRN: [id number]  DOB: 1924-02-20 Age / Sex: 87 y.o., female   PCP: Lowella Dandy, NP         Medical Service: Internal Medicine Teaching Service         Attending Physician: Dr. Velna Ochs, MD    First Contact: Dr. France Ravens Pager: [phone]  Second Contact: Dr. Gaylan Gerold Pager: [phone]       After Hours (After 5p/  First Contact Pager: [phone]  weekends / holidays): Second Contact Pager: [phone]   Chief Complaint: SOB, insomnia  History of Present Illness:   Tonya Cruz is a pleasant 87 year old Caucasian female with PMHx of R sided breast cancer 2001 S/P lumpectomy and radiation, hypertension, hyperlipidemia, NSTEMI 2018, HFpEF, CAD with chronically occluded RCA with LAD DES, A. fib on apixaban, hypothyroidism, chronic anemia, who presents to [facility] ED for shortness of breath, difficulty sleeping at night.  Patient reports difficulty sleeping for the last few weeks.  She reports she sleeps with head elevated on pillows and will be able to fall asleep but then wakes up in the middle of the night feeling panicked and unable to fall back asleep.  She has taken 1/2 mg of Ativan in the past to help her fall back asleep.  She also endorses shortness of breath walking short distances within the home.  Recently she has been ambulating far less in the home.  She is still able to transfer to bedside commode on her own.  She denies chest pain, cough, fever, nausea vomiting, abdominal pain, difficulty swallowing, changes in vision, dysuria, focal weakness.  Has daily bowel movements. Endorses current sinus congestion and postnasal drip for which she occasionally takes allergy medication.  She does not believe she is having leg swelling, reports in the past legs have been far more swollen and Lasix has prevented swelling.  She reports she takes Lasix every day.  Reports recently she is weighed ~155 pounds, reports 3  months ago she weighed 170 pounds.  Reports she had some right-sided arm swelling after receiving COVID shot which improved with compression sleeve.  She was told this was related to her past history of right-sided breast cancer status post partial mastectomy and radiation.  Reports arms are equal at this time and swelling has resolved.  She has local family and friends who help her with IADLs. Her husband passed away 11 years ago, she has been alone since then. She was able to use a walker to ambulate around the home however recently ambulating less, feeling weaker in her legs.  Prepare small meals herself. She has been eating less, less appetite.  In the ED, patient was afebrile, normotensive, otherwise hemodynamically stable.  Was noted to have increased work of breathing on room air though oxygen saturation appropriate.  CBC with stable normocytic anemia, CMP with BUN 33 creatinine 1.47 elevated from baseline, albumin 2.6.  BNP 626.  CXR central vascular congestion.  CTA negative for PE, moderate right pleural effusion with atelectasis posterior right lower lobe.  Patient given 1 L fluid bolus in the ED and then treated with Lasix 40 mg IV.  Subsequently treated with dose of ceftriaxone for asymptomatic UTI.  Meds:   Current Outpatient Medications  Medication Instructions   apixaban (ELIQUIS) 5 mg, Oral, 2 times daily   atorvastatin (LIPITOR) 40 MG tablet TAKE 1 TABLET(40  MG) BY MOUTH DAILY   buPROPion (WELLBUTRIN) 75 mg, Oral, Daily at bedtime   carvedilol (COREG) 6.25 MG tablet TAKE 1 TABLET(6.25 MG) BY MOUTH TWICE DAILY WITH A MEAL   cyanocobalamin (,VITAMIN B-12,) 1000 MCG/ML injection Intramuscular, Every 30 days   diltiazem (CARDIZEM CD) 240 mg, Oral, Daily   famciclovir (FAMVIR) 500 mg, Oral, 3 times daily   furosemide (LASIX) 20 MG tablet Take one tablet by mouth daily Monday, Wednesday and Friday   hydrALAZINE (APRESOLINE) 50 mg, Oral, 2 times daily   isosorbide mononitrate (IMDUR)  60 mg, Oral, Daily   levothyroxine (SYNTHROID) 100 mcg, Oral, Daily   LORazepam (ATIVAN) 1 mg, Oral, Daily at bedtime, .   nitroGLYCERIN (NITROSTAT) 0.4 MG SL tablet Sublingual   pantoprazole (PROTONIX) 40 MG tablet TAKE 1 TABLET(40 MG) BY MOUTH DAILY   pregabalin (LYRICA) 75 mg, Oral, 3 times daily  **Lasix daily**   Allergies: Allergies as of 08/16/2021 - Review Complete 08/16/2021  Allergen Reaction Noted   Celexa [citalopram hydrobromide] Other (See Comments) 07/22/2017   Penicillins  06/08/2017   Ciprofloxacin hcl Rash 06/08/2017   Past Medical History:  Diagnosis Date   Allergy    Anemia    pernicious   Anxiety    Arthritis    Breast cancer ([place])    right side   Coronary artery disease    06/11/17 PTCA/DES x1 to mLAD, CTO mRCA with col.    Diastolic heart failure (HCC)    GERD (gastroesophageal reflux disease)    High cholesterol    History of myocardial infarction    Hypertension    NSTEMI (non-ST elevated myocardial infarction) ([place]) 06/11/2017   Osteoporosis    Paroxysmal atrial fibrillation (HCC)    Post herpetic neuralgia    Thyroid disease     Family History:  Family History  Problem Relation Age of Onset   Diabetes Mother    Breast cancer Mother    Other Father        cerebral hemorrhage    Social History:  Social History   Tobacco Use   Smoking status: Former   Smokeless tobacco: Never  Scientific laboratory technician Use: Never used  Substance Use Topics   Alcohol use: No   Drug use: No  She has local family and friends who help her with IADLs. Her husband passed away 11 years ago, she has been alone since then. She was able to use a walker to ambulate around the home however recently ambulating less, feeling weaker in her legs.  Prepares small meals herself. She has been eating less, less appetite.  Review of Systems: A complete ROS was negative except as per HPI.   Physical Exam: Blood pressure (!) 134/100, pulse 94, temperature 97.8 F (36.6 C),  temperature source Oral, resp. rate (!) 23, height 5\' 5"  (1.651 m), weight 72.1 kg, SpO2 96 %. Physical Exam: General: Elderly Caucasian female, NAD HENT: normocephalic, atraumatic, MMM EYES: conjunctiva non-erythematous, no scleral icterus CV: regular rate, irregular rhythm, no murmurs, rubs, gallops.  2+ pitting edema to below the knee, trace pitting edema to mid thigh Pulmonary: Increased work of breathing on RA, difficulty completing sentences, decreased breath sounds right lung base Abdominal: Distended, soft, non-tender to palpation, normal BS Skin: Warm and dry, no rashes or lesions Neurological: MS: awake, alert and oriented x3, normal speech and fund of knowledge Motor: moves all extremities antigravity Psych: normal affect  CBC    Component Value Date/Time   WBC 10.4 08/16/2021  1445   RBC 3.82 (L) 08/16/2021 1445   HGB 10.4 (L) 08/16/2021 1445   HGB 11.0 (L) 06/28/2017 0944   HCT 33.3 (L) 08/16/2021 1445   HCT 32.9 (L) 06/28/2017 0944   PLT 209 08/16/2021 1445   PLT 193 06/28/2017 0944   MCV 87.2 08/16/2021 1445   MCV 88 06/28/2017 0944   MCH 27.2 08/16/2021 1445   MCHC 31.2 08/16/2021 1445   RDW 16.4 (H) 08/16/2021 1445   RDW 14.7 06/28/2017 0944   LYMPHSABS 0.3 (L) 08/16/2021 1445   LYMPHSABS 0.9 06/28/2017 0944   MONOABS 0.8 08/16/2021 1445   EOSABS 0.0 08/16/2021 1445   EOSABS 0.2 06/28/2017 0944   BASOSABS 0.0 08/16/2021 1445   BASOSABS 0.0 06/28/2017 0944   CMP     Component Value Date/Time   NA 134 (L) 08/16/2021 1445   NA 139 11/09/2020 0000   K 4.2 08/16/2021 1445   CL 98 08/16/2021 1445   CO2 26 08/16/2021 1445   GLUCOSE 154 (H) 08/16/2021 1445   BUN 33 (H) 08/16/2021 1445   BUN 16 11/09/2020 0000   CREATININE 1.47 (H) 08/16/2021 1445   CALCIUM 7.8 (L) 08/16/2021 1445   PROT 5.5 (L) 08/16/2021 1445   ALBUMIN 2.6 (L) 08/16/2021 1445   AST 23 08/16/2021 1445   ALT 15 08/16/2021 1445   ALKPHOS 106 08/16/2021 1445   BILITOT 0.7 08/16/2021 1445    GFRNONAA 33 (L) 08/16/2021 1445   GFRAA 47 (L) 07/07/2017 0157   Brain natriuretic peptide [[id number]] (Abnormal) Collected: 08/16/21 1546  Specimen: Blood from Vein Updated: 08/16/21 1711   B Natriuretic Peptide 626.1 High  pg/mL     EKG: personally reviewed my interpretation is A. fib  CXR: personally reviewed my interpretation is pulmonary congestion right > left  CT ANGIOGRAPHY CHEST WITH CONTRAST No evidence of pulmonary embolism. Small to moderate RIGHT pleural effusion with compressive atelectasis of the posterior RIGHT lower lobe. Trace pericardial effusion. Aortic Atherosclerosis, Electronically Signed   By: Lavonia Dana M.D.  Assessment & Plan by Problem: Principal Problem:   Acute on chronic diastolic (congestive) heart failure (HCC) Active Problems:   History of breast cancer   AKI (acute kidney injury) ([place])   Pleural effusion, right   Atrial fibrillation [facility])  Tonya Cruz is a pleasant 86 year old Caucasian female with PMHx of R sided breast cancer S/P lumpectomy and radiation, hypertension, hyperlipidemia, NSTEMI 2018, HFpEF, CAD with chronically occluded RCA with LAD DES, A. fib on apixaban, hypothyroidism, chronic anemia, who presents to [facility] ED for shortness of breath and was found to have moderate-sized right pleural effusion in the setting of suspected acute on chronic diastolic heart failure exacerbation.  #Acute on chronic diastolic heart failure exacerbation #Moderate-sized right pleural effusion #History Stage 1 right-sided breast cancer 2001 status post lumpectomy and radiation Patient presented with shortness of breath, orthopnea, PND, lower extremity edema.  CXR with pulmonary congestion R >L, CTA negative for PE and shows moderate-sized right pleural effusion.  Currently with increased work of breathing and normal oxygen saturation on room air. Likely patient has acute heart failure exacerbation causing pleural effusion. Weight today 159  pounds, 1 month ago 163 pounds, 1 year ago 170 pounds. Per patient's cardiologist Dr. Tamala Julian, diastolic dysfunction related to A. fib. Also considered right pleural effusion secondary to recurrence of right-sided breast cancer.  Per last oncology note 05/2021, diagnostic mammogram and ultrasound in January 2022 revealed possible intraductal mass versus dilated duct with debris at 10  o'clock position in the right breast measuring 0.9 cm.  Patient declined biopsy of the breast and punch biopsy of the breast skin as well as MRI of the breast.  Patient was not interested in aggressive evaluation or treatment.  Plan was to monitor, repeat imaging at a later date, noted that they would not pursue surgery if it were cancer, could consider hormonal therapy with a daily pill of estrogen sensitive. No findings on pulmonary imaging thus far to suggest metastasis.   Plan: -IV Lasix 40 mg once -O2 supplementation as needed -Strict ins and outs -Daily weights -Holding Eliquis in anticipation of thoracentesis in the a.m. -IR consult for thoracentesis placed  #Hypertension #Hyperlipidemia #CAD, history NSTEMI, chronically occluded RCA, LAD DES Denies chest pain.  EKG without ischemic changes.  Follows with cardiology Dr. Tamala Julian, last OV 10/17. Plan: -Continue Lipitor 40 mg daily -Continue Coreg 6.25 mg twice daily -Continue diltiazem 240 mg daily -Continue Imdur 60 mg daily  #Paroxysmal A. Fib Currently in atrial fibrillation.  EKG shows A. fib.  Generalized weakness for last few weeks could be related to symptomatic A. fib. Plan: -Holding Eliquis in anticipation of thoracentesis in the a.m. -Continue Coreg and diltiazem as above  #Depression with insomnia #Anxiety Patient occasionally takes Ativan 0.5 mg, last took it last night for difficulty sleeping. Plan: -Ativan 0.5 mg nightly as needed -Bupropion 75 mg nightly  #Hypothyroidism Last TSH 2018 was 13.  Free T4 in 2021 normal at 1.63.  Patient  follows with PCP Laverna Peace, NP who manages her thyroid medication.  Documentation not visible in patient chart.  Patient reports taking medication appropriately 30 minutes before breakfast and any other medications. Plan: -F/U TSH -Continue Synthroid 100 MCG daily -Continue home bowel regimen senna twice daily, MiraLAX as needed  #Postherpetic neuralgia -Continue Lyrica 75 mg 3 times daily  #GERD -Continue Protonix 40 mg daily  Diet: Heart VTE: SCDs. Holding Eliquis until 11/10 PM dose IVF: None Code: DNR   Dispo: Admit patient to Inpatient with expected length of stay greater than 2 midnights.  Portions of this report may have been transcribed using voice recognition software. Every effort was made to ensure accuracy; however, inadvertent computerized transcription errors may be present.   Signed: Wayland Denis, MD 08/17/2021, 2:02 AM  Pager: [phone] After 5pm on weekdays and 1pm on weekends: On Call pager: [phone]

## 2021-08-16 NOTE — ED Provider Notes (Signed)
[facility] EMERGENCY DEPARTMENT Provider Note   CSN: [id number] Arrival date & time: 08/16/21  1425     History Chief Complaint  Patient presents with   Weakness    Tonya Cruz is a 87 y.o. female.  87 yo F with a chief complaints of weakness and insomnia.  This is been going on for about a month to 6 weeks she thinks.  She had some difficulty breathing with it as well.  Typically goes to bed about 9 PM but has been waking up about 2 AM and has been unable to go back to sleep.  She is not sure why exactly she cannot go back to sleep.  She is also not really had an appetite.  She has had some difficulty breathing but has trouble quantifying this.  Denies any chest pain or pressure denies cough or congestion denies nausea or vomiting or diarrhea.  Denies abdominal pain.  She thinks that she recently changed her Lasix.  She feels like her legs are little bit less swollen than normal.  The history is provided by the patient.  Weakness Severity:  Moderate Onset quality:  Gradual Duration:  4 weeks Timing:  Constant Progression:  Worsening Chronicity:  New Relieved by:  Nothing Worsened by:  Nothing Ineffective treatments:  None tried Associated symptoms: no arthralgias, no chest pain, no dizziness, no dysuria, no fever, no headaches, no myalgias, no nausea, no shortness of breath, no urgency and no vomiting       Past Medical History:  Diagnosis Date   Allergy    Anemia    pernicious   Anxiety    Arthritis    Breast cancer ([place])    right side   Coronary artery disease    06/11/17 PTCA/DES x1 to mLAD, CTO mRCA with col.    GERD (gastroesophageal reflux disease)    High cholesterol    History of myocardial infarction    Hypertension    NSTEMI (non-ST elevated myocardial infarction) ([place]) 06/11/2017   Osteoporosis    Paroxysmal atrial fibrillation ([place])    Post herpetic neuralgia    Thyroid disease     Patient Active Problem List   Diagnosis  Date Noted   Breast cancer of upper-outer quadrant of right female breast ([place]) 10/26/2020    Class: Diagnosis of   Breast cancer in female ([place]) 10/19/2020    Class: Diagnosis of   Lymphedema of right upper extremity 10/14/2020    Class: Chronic   Extensor tendonitis of foot 01/19/2020   Hypertrophic and atrophic condition of skin 01/19/2020   Chest pain 07/06/2017   Acute hyponatremia 07/06/2017   GERD (gastroesophageal reflux disease) 07/06/2017   Hypothyroidism 07/06/2017   Hyperlipemia 06/12/2017   CAD (coronary artery disease), native coronary artery 06/12/2017   Hypertension 06/08/2017   Old MI (myocardial infarction) 06/08/2017    Past Surgical History:  Procedure Laterality Date   BREAST SURGERY     CARDIAC CATHETERIZATION  06/11/2017   CORONARY STENT INTERVENTION N/A 06/11/2017   Procedure: CORONARY STENT INTERVENTION;  Surgeon: Burnell Blanks, MD;  Location: [facility];  Service: Cardiovascular;  Laterality: N/A;   LEFT HEART CATH AND CORONARY ANGIOGRAPHY N/A 06/11/2017   Procedure: LEFT HEART CATH AND CORONARY ANGIOGRAPHY;  Surgeon: Burnell Blanks, MD;  Location: [facility];  Service: Cardiovascular;  Laterality: N/A;   roto cuff     TONSILLECTOMY       OB History   No obstetric history  on file.     Family History  Problem Relation Age of Onset   Diabetes Mother    Breast cancer Mother    Other Father        cerebral hemorrhage    Social History   Tobacco Use   Smoking status: Former   Smokeless tobacco: Never  Scientific laboratory technician Use: Never used  Substance Use Topics   Alcohol use: No   Drug use: No    Home Medications Prior to Admission medications   Medication Sig Start Date End Date Taking? Authorizing Provider  apixaban (ELIQUIS) 5 MG TABS tablet Take 1 tablet (5 mg total) by mouth 2 (two) times daily. 10/17/18   Isaiah Serge, NP  atorvastatin (LIPITOR) 40 MG tablet TAKE 1 TABLET(40 MG) BY MOUTH DAILY 11/16/19    Marge Duncans, PA-C  buPROPion [facility]) 75 MG tablet Take 1 tablet (75 mg total) by mouth at bedtime. 02/08/20   Marge Duncans, PA-C  carvedilol (COREG) 6.25 MG tablet TAKE 1 TABLET(6.25 MG) BY MOUTH TWICE DAILY WITH A MEAL 03/11/20   Marge Duncans, PA-C  cyanocobalamin (,VITAMIN B-12,) 1000 MCG/ML injection Inject into the muscle every 30 (thirty) days. 05/29/21   [provider]  diltiazem (CARDIZEM CD) 240 MG 24 hr capsule Take 1 capsule (240 mg total) by mouth daily. 07/24/21   Belva Crome, MD  famciclovir (FAMVIR) 500 MG tablet Take 500 mg by mouth 3 (three) times daily. 02/16/21   [provider]  furosemide (LASIX) 20 MG tablet Take one tablet by mouth daily Monday, Wednesday and Friday 07/24/21   Belva Crome, MD  hydrALAZINE (APRESOLINE) 50 MG tablet Take 1 tablet (50 mg total) by mouth in the morning and at bedtime. 09/12/20   Belva Crome, MD  isosorbide mononitrate (IMDUR) 60 MG 24 hr tablet Take 1 tablet (60 mg total) by mouth daily. 12/02/20   Belva Crome, MD  levothyroxine (SYNTHROID) 100 MCG tablet Take 100 mcg by mouth daily. 02/09/21   [provider]  LORazepam (ATIVAN) 1 MG tablet Take 1 tablet (1 mg total) by mouth at bedtime. . Patient taking differently: Take 1 mg by mouth 3 times/day as needed-between meals & bedtime. . 02/01/20   Marge Duncans, PA-C  nitroGLYCERIN (NITROSTAT) 0.4 MG SL tablet Place under the tongue. 04/01/20   [provider]  pantoprazole (PROTONIX) 40 MG tablet TAKE 1 TABLET(40 MG) BY MOUTH DAILY 03/11/20   Cox, Kirsten, MD  predniSONE (DELTASONE) 10 MG tablet Take 10 mg by mouth daily with breakfast. Pt take 10 mg daily, and taper off on 07/26/21  and start taking 5 mg once a day    [provider]  pregabalin (LYRICA) 75 MG capsule Take 75 mg by mouth 3 (three) times daily. 05/05/21   [provider]    Allergies    Celexa [citalopram hydrobromide], Penicillins, and Ciprofloxacin hcl  Review of Systems    Review of Systems  Constitutional:  Positive for fatigue (generalized). Negative for chills and fever.  HENT:  Negative for congestion and rhinorrhea.   Eyes:  Negative for redness and visual disturbance.  Respiratory:  Negative for shortness of breath and wheezing.   Cardiovascular:  Negative for chest pain and palpitations.  Gastrointestinal:  Negative for nausea and vomiting.  Genitourinary:  Negative for dysuria and urgency.  Musculoskeletal:  Negative for arthralgias and myalgias.  Skin:  Negative for pallor and wound.  Neurological:  Positive for weakness. Negative for  dizziness and headaches.   Physical Exam Updated Vital Signs BP 133/86   Pulse 87   Temp 97.8 F (36.6 C) (Oral)   Resp 17   Ht 5\' 5"  (1.651 m)   Wt 72.1 kg   SpO2 97%   BMI 26.46 kg/m   Physical Exam Vitals and nursing note reviewed.  Constitutional:      General: She is not in acute distress.    Appearance: She is well-developed. She is not diaphoretic.  HENT:     Head: Normocephalic and atraumatic.  Eyes:     Pupils: Pupils are equal, round, and reactive to light.  Cardiovascular:     Rate and Rhythm: Normal rate and regular rhythm.     Heart sounds: No murmur heard.   No friction rub. No gallop.  Pulmonary:     Effort: Pulmonary effort is normal.     Breath sounds: No wheezing or rales.     Comments: Diminished breath sounds in all fields Abdominal:     General: There is no distension.     Palpations: Abdomen is soft.     Tenderness: There is no abdominal tenderness.  Musculoskeletal:        General: No tenderness.     Cervical back: Normal range of motion and neck supple.  Skin:    General: Skin is warm and dry.  Neurological:     Mental Status: She is alert and oriented to person, place, and time.  Psychiatric:        Behavior: Behavior normal.    ED Results / Procedures / Treatments   Labs (all labs ordered are listed, but only abnormal results are displayed) Labs Reviewed   COMPREHENSIVE METABOLIC PANEL - Abnormal; Notable for the following components:      Result Value   Sodium 134 (*)    Glucose, Bld 154 (*)    BUN 33 (*)    Creatinine, Ser 1.47 (*)    Calcium 7.8 (*)    Total Protein 5.5 (*)    Albumin 2.6 (*)    GFR, Estimated 33 (*)    All other components within normal limits  CBC WITH DIFFERENTIAL/PLATELET - Abnormal; Notable for the following components:   RBC 3.82 (*)    Hemoglobin 10.4 (*)    HCT 33.3 (*)    RDW 16.4 (*)    Neutro Abs 9.2 (*)    Lymphs Abs 0.3 (*)    Abs Immature Granulocytes 0.08 (*)    All other components within normal limits  BRAIN NATRIURETIC PEPTIDE - Abnormal; Notable for the following components:   B Natriuretic Peptide 626.1 (*)    All other components within normal limits  RESP PANEL BY RT-PCR (FLU A&B, COVID) ARPGX2  URINALYSIS, ROUTINE W REFLEX MICROSCOPIC  TROPONIN I (HIGH SENSITIVITY)  TROPONIN I (HIGH SENSITIVITY)    EKG EKG Interpretation  Date/Time:  Wednesday August 16 2021 14:33:55 EST Ventricular Rate:  85 PR Interval:    QRS Duration: 71 QT Interval:  326 QTC Calculation: 388 R Axis:   44 Text Interpretation: Atrial fibrillation Borderline repolarization abnormality Otherwise no significant change Confirmed by Deno Etienne [id number]) on 08/16/2021 3:10:11 PM  Radiology CT Angio Chest PE W and/or Wo Contrast  Result Date: 08/16/2021 CLINICAL DATA:  Generalized weakness, trouble sleeping, atrial fibrillation, question pulmonary embolism, high clinical suspicion EXAM: CT ANGIOGRAPHY CHEST WITH CONTRAST TECHNIQUE: Multidetector CT imaging of the chest was performed using the standard protocol during bolus administration of intravenous contrast. Multiplanar CT  image reconstructions and MIPs were obtained to evaluate the vascular anatomy. CONTRAST:  143mL OMNIPAQUE IOHEXOL 350 MG/ML SOLN IV COMPARISON:  02/16/2014 FINDINGS: Cardiovascular: Atherosclerotic calcifications of aorta and coronary arteries.  Aorta normal caliber. Heart unremarkable. Trace pericardial effusion. Pulmonary arteries well opacified and patent. No evidence of pulmonary embolism. Mediastinum/Nodes: Base of cervical region normal appearance. Esophagus unremarkable. No thoracic adenopathy. Lungs/Pleura: Small to moderate RIGHT pleural effusion. Mild compressive atelectasis of posterior RIGHT lower lobe. Linear subsegmental atelectasis at RIGHT middle lobe. No pulmonary infiltrate or pneumothorax. Upper Abdomen: Cyst within anterior liver unchanged. Reflux of contrast into a hepatic veins and IVC. Remaining visualized upper abdomen unremarkable. Musculoskeletal: Osseous demineralization. Mild degenerative disc disease changes of cervical and thoracic spine. Review of the MIP images confirms the above findings. IMPRESSION: No evidence of pulmonary embolism. Small to moderate RIGHT pleural effusion with compressive atelectasis of the posterior RIGHT lower lobe. Trace pericardial effusion. Aortic Atherosclerosis (ICD10-I70.0). Electronically Signed   By: Lavonia Dana M.D.   On: 08/16/2021 17:50   DG Chest Port 1 View  Result Date: 08/16/2021 CLINICAL DATA:  Shortness of breath EXAM: PORTABLE CHEST 1 VIEW COMPARISON:  07/13/2021 FINDINGS: Mild cardiomegaly with central vascular congestion. Hazy atelectasis at the right base. Possible small right effusion. No pneumothorax. Previously noted nodular density at the right base is not clearly identified on the current exam IMPRESSION: Mild cardiomegaly with central vascular congestion and hazy atelectasis at the right base. Possible small right effusion. Electronically Signed   By: Donavan Foil M.D.   On: 08/16/2021 15:34    Procedures Procedures   Medications Ordered in ED Medications  sodium chloride 0.9 % bolus 1,000 mL (0 mLs Intravenous Stopped 08/16/21 1803)  iohexol (OMNIPAQUE) 350 MG/ML injection 100 mL (100 mLs Intravenous Contrast Given 08/16/21 1738)    ED Course  I have reviewed  the triage vital signs and the nursing notes.  Pertinent labs & imaging results that were available during my care of the patient were reviewed by me and considered in my medical decision making (see chart for details).    MDM Rules/Calculators/A&P                           87 yo F with a cc of fatigue.  This been going on for about a month now.  She is quite short of breath on my exam having to stop and catch her breath in between sentences.  She has diminished lung sounds in all fields no history of asthma or COPD.  Does have a history of heart failure but looks quite dry on my exam.  Wraps the patient has a viral syndrome not sure why it would be so prolonged.  She also is high risk for PE as she has a history of breast cancer.  We will obtain a CT angiogram of the chest.  My view of the initial chest x-ray with possible right lower lobe pneumonia though without obvious cough we will continue with advanced imaging.  CT with R sided pleural effusion.  Likely the cause of the patient's symptoms.  No significant anemia no significant electrolyte normality. discussed with medicine for admisson.   The patients results and plan were reviewed and discussed.   Any x-rays performed were independently reviewed by myself.   Differential diagnosis were considered with the presenting HPI.  Medications  sodium chloride 0.9 % bolus 1,000 mL (0 mLs Intravenous Stopped 08/16/21 1803)  iohexol (OMNIPAQUE) 350  MG/ML injection 100 mL (100 mLs Intravenous Contrast Given 08/16/21 1738)    Vitals:   08/16/21 1815 08/16/21 1830 08/16/21 1845 08/16/21 1900  BP: (!) 152/70 (!) 164/79 136/66 133/86  Pulse: (!) 57 91 83 87  Resp: (!) 24 (!) 22 19 17   Temp:      TempSrc:      SpO2: 97% 97% 97% 97%  Weight:      Height:        Final diagnoses:  Pleural effusion on right    Admission/ observation were discussed with the admitting physician, patient and/or family and they are comfortable with the plan.      Final Clinical Impression(s) / ED Diagnoses Final diagnoses:  Pleural effusion on right    Rx / DC Orders ED Discharge Orders     None        Deno Etienne, DO 08/16/21 2002

## 2021-08-16 NOTE — ED Triage Notes (Signed)
Pt BIB EMS from home c/o generalized weakness and trouble sleeping. Pt has a hx of controled afib. Pt recently had an increase in her lasix dosage, however family was not sure what the exact dosage was. They are worried about a possible electrolyte imbalance.   VSS stable with EMS except heart rate d/t afib.

## 2021-08-17 ENCOUNTER — Inpatient Hospital Stay (HOSPITAL_COMMUNITY): Payer: Medicare Other

## 2021-08-17 DIAGNOSIS — Z853 Personal history of malignant neoplasm of breast: Secondary | ICD-10-CM

## 2021-08-17 DIAGNOSIS — N179 Acute kidney failure, unspecified: Secondary | ICD-10-CM | POA: Diagnosis not present

## 2021-08-17 DIAGNOSIS — I4821 Permanent atrial fibrillation: Secondary | ICD-10-CM

## 2021-08-17 DIAGNOSIS — I5033 Acute on chronic diastolic (congestive) heart failure: Secondary | ICD-10-CM | POA: Diagnosis not present

## 2021-08-17 DIAGNOSIS — J90 Pleural effusion, not elsewhere classified: Secondary | ICD-10-CM

## 2021-08-17 LAB — CBC
HCT: 32.2 % — ABNORMAL LOW (ref 36.0–46.0)
Hemoglobin: 10.2 g/dL — ABNORMAL LOW (ref 12.0–15.0)
MCH: 27.2 pg (ref 26.0–34.0)
MCHC: 31.7 g/dL (ref 30.0–36.0)
MCV: 85.9 fL (ref 80.0–100.0)
Platelets: 217 10*3/uL (ref 150–400)
RBC: 3.75 MIL/uL — ABNORMAL LOW (ref 3.87–5.11)
RDW: 16.2 % — ABNORMAL HIGH (ref 11.5–15.5)
WBC: 10.9 10*3/uL — ABNORMAL HIGH (ref 4.0–10.5)
nRBC: 0 % (ref 0.0–0.2)

## 2021-08-17 LAB — ECHOCARDIOGRAM COMPLETE
AR max vel: 2.87 cm2
AV Peak grad: 5.2 mmHg
Ao pk vel: 1.14 m/s
Area-P 1/2: 4.74 cm2
Calc EF: 58.5 %
Height: 65 in
P 1/2 time: 489 msec
S' Lateral: 3.6 cm
Single Plane A2C EF: 55.2 %
Single Plane A4C EF: 58.8 %
Weight: 2544 oz

## 2021-08-17 LAB — BASIC METABOLIC PANEL
Anion gap: 9 (ref 5–15)
BUN: 29 mg/dL — ABNORMAL HIGH (ref 8–23)
CO2: 25 mmol/L (ref 22–32)
Calcium: 7.7 mg/dL — ABNORMAL LOW (ref 8.9–10.3)
Chloride: 102 mmol/L (ref 98–111)
Creatinine, Ser: 1.2 mg/dL — ABNORMAL HIGH (ref 0.44–1.00)
GFR, Estimated: 42 mL/min — ABNORMAL LOW (ref >60)
Glucose, Bld: 98 mg/dL (ref 70–99)
Potassium: 3.8 mmol/L (ref 3.5–5.1)
Sodium: 136 mmol/L (ref 135–145)

## 2021-08-17 LAB — GLUCOSE, CAPILLARY: Glucose-Capillary: 135 mg/dL — ABNORMAL HIGH (ref 70–99)

## 2021-08-17 LAB — TSH: TSH: 3.086 u[IU]/mL (ref 0.350–4.500)

## 2021-08-17 MED ORDER — LORAZEPAM 0.5 MG PO TABS: 0.5000 mg | ORAL_TABLET | Freq: Every evening | ORAL | Status: DC | PRN

## 2021-08-17 MED ORDER — IPRATROPIUM-ALBUTEROL 0.5-2.5 (3) MG/3ML IN SOLN: 3.0000 mL | Freq: Four times a day (QID) | RESPIRATORY_TRACT | Status: DC | PRN

## 2021-08-17 MED ORDER — BUPROPION HCL 75 MG PO TABS
75.0000 mg | ORAL_TABLET | Freq: Every day | ORAL | Status: DC
  Administered 2021-08-18 – 2021-08-21 (×4): 75 mg via ORAL
  Filled 2021-08-17 (×4): qty 1

## 2021-08-17 MED ORDER — FUROSEMIDE 10 MG/ML IJ SOLN
60.0000 mg | Freq: Once | INTRAMUSCULAR | Status: AC
  Administered 2021-08-17: 60 mg via INTRAVENOUS
  Filled 2021-08-17: qty 6

## 2021-08-17 MED ORDER — PERFLUTREN LIPID MICROSPHERE
1.0000 mL | INTRAVENOUS | Status: AC | PRN
  Administered 2021-08-17: 2 mL via INTRAVENOUS
  Filled 2021-08-17: qty 10

## 2021-08-17 NOTE — Progress Notes (Signed)
HD#1 Subjective:  Overnight Events: none  Patient seen and assessed by bedside this am. Patient states that she is feeling cold this morning (shivering w multiple blankets still).  Patient still feels short of breath and still on 2 L [place].  Patient reports shortness of breath is not bothersome during the day that she sleeps well throughout the night.  Patient was audibly wheezing while conversing and would not be able to complete sentences without becoming short of breath.  Discussed plan with patient to speak with daughter regarding thoracentesis as well as breast MRI.  In addition, discussed that we would be using IV Lasix to pull fluid from her in order to improve her breathing.  Patient states that she would defer all decision making to her daughter Teena Irani.    Objective:  Vital signs in last 24 hours: Vitals:   08/17/21 0515 08/17/21 0530 08/17/21 0545 08/17/21 0600  BP: 127/67 (!) 149/69 (!) 144/132 (!) 156/93  Pulse: 71 86 81 96  Resp: (!) 21 (!) 34 (!) 30 (!) 28  Temp:      TempSrc:      SpO2: 93% 93% 93% 93%  Weight:      Height:       Supplemental O2: Nasal Cannula SpO2: 93 %   Physical Exam:  Physical Exam Constitutional:      General: She is in acute distress.     Appearance: She is ill-appearing.  HENT:     Nose: Rhinorrhea present. No congestion.  Cardiovascular:     Rate and Rhythm: Tachycardia present. Rhythm irregular.     Pulses: Normal pulses.     Heart sounds: Normal heart sounds.  Pulmonary:     Breath sounds: Wheezing present.  Chest:  Breasts:    Breasts are asymmetrical.     Right: Inverted nipple, nipple discharge and skin change present.     Comments: Multiple left cherry angioma Right thickened skin changes on breast No lymphadenopathy noted on either side Abdominal:     General: Bowel sounds are normal.     Palpations: Abdomen is soft.  Lymphadenopathy:     Cervical: No cervical adenopathy.  Skin:    General: Skin is warm and  dry.  Neurological:     Mental Status: She is alert and oriented to person, place, and time.  Psychiatric:     Comments: anxious      Filed Weights   08/16/21 1435  Weight: 72.1 kg     Intake/Output Summary (Last 24 hours) at 08/17/2021 0649 Last data filed at 08/17/2021 0315 Gross per 24 hour  Intake 1100 ml  Output 1325 ml  Net -225 ml   Net IO Since Admission: -225 mL [08/17/21 0649]  Pertinent Labs: CBC Latest Ref Rng & Units 08/17/2021 08/16/2021 11/09/2020  WBC 4.0 - 10.5 K/uL 10.9(H) 10.4 6.5  Hemoglobin 12.0 - 15.0 g/dL 10.2(L) 10.4(L) 12.6  Hematocrit 36.0 - 46.0 % 32.2(L) 33.3(L) 39  Platelets 150 - 400 K/uL 217 209 143(A)    CMP Latest Ref Rng & Units 08/17/2021 08/16/2021 11/09/2020  Glucose 70 - 99 mg/dL 98 154(H) -  BUN 8 - 23 mg/dL 29(H) 33(H) 16  Creatinine 0.44 - 1.00 mg/dL 1.20(H) 1.47(H) 1.1  Sodium 135 - 145 mmol/L 136 134(L) 139  Potassium 3.5 - 5.1 mmol/L 3.8 4.2 4.4  Chloride 98 - 111 mmol/L 102 98 104  CO2 22 - 32 mmol/L 25 26 27(A)  Calcium 8.9 - 10.3 mg/dL 7.7(L) 7.8(L) 8.6(A)  Total Protein 6.5 - 8.1 g/dL - 5.5(L) -  Total Bilirubin 0.3 - 1.2 mg/dL - 0.7 -  Alkaline Phos 38 - 126 U/L - 106 -  AST 15 - 41 U/L - 23 -  ALT 0 - 44 U/L - 15 -    Imaging: CT Angio Chest PE W and/or Wo Contrast  Result Date: 08/16/2021 CLINICAL DATA:  Generalized weakness, trouble sleeping, atrial fibrillation, question pulmonary embolism, high clinical suspicion EXAM: CT ANGIOGRAPHY CHEST WITH CONTRAST TECHNIQUE: Multidetector CT imaging of the chest was performed using the standard protocol during bolus administration of intravenous contrast. Multiplanar CT image reconstructions and MIPs were obtained to evaluate the vascular anatomy. CONTRAST:  195mL OMNIPAQUE IOHEXOL 350 MG/ML SOLN IV COMPARISON:  02/16/2014 FINDINGS: Cardiovascular: Atherosclerotic calcifications of aorta and coronary arteries. Aorta normal caliber. Heart unremarkable. Trace pericardial effusion.  Pulmonary arteries well opacified and patent. No evidence of pulmonary embolism. Mediastinum/Nodes: Base of cervical region normal appearance. Esophagus unremarkable. No thoracic adenopathy. Lungs/Pleura: Small to moderate RIGHT pleural effusion. Mild compressive atelectasis of posterior RIGHT lower lobe. Linear subsegmental atelectasis at RIGHT middle lobe. No pulmonary infiltrate or pneumothorax. Upper Abdomen: Cyst within anterior liver unchanged. Reflux of contrast into a hepatic veins and IVC. Remaining visualized upper abdomen unremarkable. Musculoskeletal: Osseous demineralization. Mild degenerative disc disease changes of cervical and thoracic spine. Review of the MIP images confirms the above findings. IMPRESSION: No evidence of pulmonary embolism. Small to moderate RIGHT pleural effusion with compressive atelectasis of the posterior RIGHT lower lobe. Trace pericardial effusion. Aortic Atherosclerosis (ICD10-I70.0). Electronically Signed   By: Lavonia Dana M.D.   On: 08/16/2021 17:50   DG Chest Port 1 View  Result Date: 08/16/2021 CLINICAL DATA:  Shortness of breath EXAM: PORTABLE CHEST 1 VIEW COMPARISON:  07/13/2021 FINDINGS: Mild cardiomegaly with central vascular congestion. Hazy atelectasis at the right base. Possible small right effusion. No pneumothorax. Previously noted nodular density at the right base is not clearly identified on the current exam IMPRESSION: Mild cardiomegaly with central vascular congestion and hazy atelectasis at the right base. Possible small right effusion. Electronically Signed   By: Donavan Foil M.D.   On: 08/16/2021 15:34    Assessment/Plan:   Principal Problem:   Acute on chronic diastolic (congestive) heart failure (HCC) Active Problems:   History of breast cancer   AKI (acute kidney injury) ([place])   Pleural effusion, right   Atrial fibrillation [facility])    Patient Summary: Tonya Cruz is a 87 y.o. with a pertinent PMH of R sided breast cancer S/P  lumpectomy and radiation, hypertension, hyperlipidemia, NSTEMI 2018, HFpEF, CAD with chronically occluded RCA with LAD DES, A. fib on apixaban, hypothyroidism, chronic anemia, who presented with shortness of breath and admitted for acute on chronic diastolic heart failure exacerbation.   #Acute on chronic diastolic heart failure exacerbation #Moderate-sized right pleural effusion #History Stage 1 right-sided breast cancer 2001 status post lumpectomy and radiation Patient presented with shortness of breath, orthopnea, PND, lower extremity edema.  Currently with increased work of breathing and requiring 2 L nasal cannula. Likely patient has acute heart failure exacerbation causing pleural effusion.  Another consideration for causes of pleural effusion is relapse of breast cancer as patient's right breast is noted to have thickening skin and serous discharge from the nipple. Patient and family were not interested in aggressive evaluation or treatment.  Called daughter and daughter does not want patient to undergo thoracentesis.  Discussed that this would help relieve some of her shortness of  breath; daughter states she will speak with patient's other daughter and call back. Plan: -IV Lasix 40 mg once 11/9 -IV Lasix 60 mg once 11/10 -O2 supplementation as needed -Strict ins and outs -Daily weights -Will await phone call from daughter prior to moving forward with thoracentesis -Discussed with breast radiologist regarding breast MRI and given patient is unable to currently lay prone for 30 minutes, it is more appropriate for patient to have breast MRI performed as an outpatient   #Hypertension #Hyperlipidemia #CAD, history NSTEMI, chronically occluded RCA, LAD DES Denies chest pain.  EKG without ischemic changes.  Follows with cardiology Dr. Tamala Julian, last OV 10/17. Plan: -Continue Lipitor 40 mg daily -Continue Coreg 6.25 mg twice daily -Continue diltiazem 240 mg daily -Continue Imdur 60 mg daily    #Paroxysmal A. Fib Currently in atrial fibrillation.  EKG shows A. fib.  Generalized weakness for last few weeks could be related to symptomatic A. fib. Plan: -Resuming home med Eliquis 2.5 mg bid as patient's daughter currently refusing thoracentesis -Continue home med Coreg 6.25 twice daily with meals -Continue home med diltiazem 24-hour capsule 240 milligrams daily    #Depression with insomnia #Anxiety Patient occasionally takes Ativan 0.5 mg, last took it last night for difficulty sleeping. Plan: -Ativan 0.5 mg nightly as needed -Switched bupropion 75 mg to daytime as Wellbutrin is usually an activating agent which may be contributing to her waking up in the middle of the night anxious and her need for Ativan for insomnia   #Hypothyroidism Patient reports taking medication appropriately 30 minutes before breakfast and any other medications. -11/10 TSH 3.086 -Continue Synthroid 100 MCG daily -Continue home bowel regimen senna twice daily, MiraLAX as needed   #Postherpetic neuralgia -Continue Lyrica 75 mg 3 times daily   #GERD -Continue Protonix 40 mg daily    Diet: Heart Healthy IVF: None,None VTE: SCDs Code: DNR PT/OT recs: Pending, none. TOC recs: pending   Dispo: Anticipated discharge to Skilled nursing facility in 5 days pending improved breathing and reduction in volume overload status.   France Ravens, MD 08/17/2021, 6:49 AM Pager: [phone]  Please contact the on call pager after 5 pm and on weekends at [phone].

## 2021-08-17 NOTE — Progress Notes (Signed)
PT Cancellation Note  Patient Details Name: Tonya Cruz MRN: [id number] DOB: 1924/05/07   Cancelled Treatment:    Reason Eval/Treat Not Completed: Fatigue/lethargy limiting ability to participate. Attempted at 15:47 with pt reporting to be too warm and too lethargic to participate, requesting PT to return later. Attempted again at 17:10, pt still reporting to be too tired to get up, kindly requesting PT to return tomorrow. Will plan to follow-up tomorrow per pt request.   Moishe Spice, PT, DPT Acute Rehabilitation Services  Pager: [phone] Office: [place] 08/17/2021, 5:29 PM

## 2021-08-17 NOTE — Plan of Care (Signed)

## 2021-08-17 NOTE — ED Notes (Signed)
Pt noted to be tachypneic when entering room. Pt reported feeling dyspnea. O2 saturation 94%. Pt placed on 2 lpm O2 via [place] for comfort.

## 2021-08-18 ENCOUNTER — Inpatient Hospital Stay (HOSPITAL_COMMUNITY): Payer: Medicare Other

## 2021-08-18 DIAGNOSIS — J90 Pleural effusion, not elsewhere classified: Secondary | ICD-10-CM | POA: Diagnosis not present

## 2021-08-18 DIAGNOSIS — I5033 Acute on chronic diastolic (congestive) heart failure: Secondary | ICD-10-CM | POA: Diagnosis not present

## 2021-08-18 DIAGNOSIS — Z853 Personal history of malignant neoplasm of breast: Secondary | ICD-10-CM | POA: Diagnosis not present

## 2021-08-18 DIAGNOSIS — N179 Acute kidney failure, unspecified: Secondary | ICD-10-CM | POA: Diagnosis not present

## 2021-08-18 LAB — CBC
HCT: 31.4 % — ABNORMAL LOW (ref 36.0–46.0)
Hemoglobin: 9.6 g/dL — ABNORMAL LOW (ref 12.0–15.0)
MCH: 26.6 pg (ref 26.0–34.0)
MCHC: 30.6 g/dL (ref 30.0–36.0)
MCV: 87 fL (ref 80.0–100.0)
Platelets: 204 10*3/uL (ref 150–400)
RBC: 3.61 MIL/uL — ABNORMAL LOW (ref 3.87–5.11)
RDW: 16.1 % — ABNORMAL HIGH (ref 11.5–15.5)
WBC: 14.2 10*3/uL — ABNORMAL HIGH (ref 4.0–10.5)
nRBC: 0 % (ref 0.0–0.2)

## 2021-08-18 LAB — GLUCOSE, CAPILLARY
Glucose-Capillary: 88 mg/dL (ref 70–99)
Glucose-Capillary: 116 mg/dL — ABNORMAL HIGH (ref 70–99)
Glucose-Capillary: 118 mg/dL — ABNORMAL HIGH (ref 70–99)
Glucose-Capillary: 112 mg/dL — ABNORMAL HIGH (ref 70–99)

## 2021-08-18 LAB — BASIC METABOLIC PANEL
Anion gap: 9 (ref 5–15)
BUN: 33 mg/dL — ABNORMAL HIGH (ref 8–23)
CO2: 24 mmol/L (ref 22–32)
Calcium: 7.5 mg/dL — ABNORMAL LOW (ref 8.9–10.3)
Chloride: 100 mmol/L (ref 98–111)
Creatinine, Ser: 1.36 mg/dL — ABNORMAL HIGH (ref 0.44–1.00)
GFR, Estimated: 36 mL/min — ABNORMAL LOW (ref >60)
Glucose, Bld: 91 mg/dL (ref 70–99)
Potassium: 3.8 mmol/L (ref 3.5–5.1)
Sodium: 133 mmol/L — ABNORMAL LOW (ref 135–145)

## 2021-08-18 MED ORDER — FUROSEMIDE 20 MG PO TABS
20.0000 mg | ORAL_TABLET | Freq: Every day | ORAL | Status: DC
  Administered 2021-08-18 – 2021-08-20 (×3): 20 mg via ORAL
  Filled 2021-08-18 (×3): qty 1

## 2021-08-18 MED ORDER — SULFAMETHOXAZOLE-TRIMETHOPRIM 800-160 MG PO TABS: 1.0000 | ORAL_TABLET | Freq: Two times a day (BID) | ORAL | Status: DC

## 2021-08-18 MED ORDER — APIXABAN 5 MG PO TABS
5.0000 mg | ORAL_TABLET | Freq: Two times a day (BID) | ORAL | Status: DC
  Administered 2021-08-18 – 2021-08-21 (×6): 5 mg via ORAL
  Filled 2021-08-18 (×6): qty 1

## 2021-08-18 MED ORDER — SULFAMETHOXAZOLE-TRIMETHOPRIM 800-160 MG PO TABS
1.0000 | ORAL_TABLET | Freq: Two times a day (BID) | ORAL | Status: AC
  Administered 2021-08-18 – 2021-08-20 (×6): 1 via ORAL
  Filled 2021-08-18 (×6): qty 1

## 2021-08-18 NOTE — Progress Notes (Addendum)
HD#2 Subjective:  Overnight Events: none  Patient seen and assessed in chair this morning in patient room.  Patient reports doing better today.  Patient was not on nasal cannula had was able to say complete sentences without catching her breath.  Patient reports that she slept well.  Patient had audible expiratory wheeze while conversing but was not as out of breath.  Discussed plan with patient.  Discussed that patient currently has an elevated white count as well as urinalysis that is still indicative of possible UTI. Patient denies dysuria, urinary frequency. Discussed also having a bladder scan to check if patient was retaining any fluid. Finally, discussed with patient plan for her to go to SNF in [place].     Objective:  Vital signs in last 24 hours: Vitals:   08/18/21 0000 08/18/21 0400 08/18/21 0808 08/18/21 1119  BP: 120/62 (!) 115/56 138/78 126/83  Pulse: (!) 47 85 78 78  Resp: 16 18 18 20   Temp: (!) 97.4 F (36.3 C) (!) 97.4 F (36.3 C) 98.2 F (36.8 C) 97.9 F (36.6 C)  TempSrc: Oral Oral Oral Oral  SpO2: 98% 98% 98% 94%  Weight: 72.6 kg     Height:       Supplemental O2: Nasal Cannula SpO2: 94 % O2 Flow Rate (L/min): 2 L/min   Physical Exam:  Physical Exam Constitutional:      General: She is not in acute distress.    Appearance: Normal appearance. She is not ill-appearing.  HENT:     Nose: No congestion or rhinorrhea.  Cardiovascular:     Rate and Rhythm: Normal rate. Rhythm irregular.     Pulses: Normal pulses.     Heart sounds: Normal heart sounds.  Pulmonary:     Breath sounds: Wheezing present.  Chest:  Breasts:    Breasts are asymmetrical.     Right: Inverted nipple, nipple discharge and skin change present.     Comments: Multiple left cherry angioma Right thickened skin changes on breast No lymphadenopathy noted on either side Abdominal:     General: Bowel sounds are normal.     Palpations: Abdomen is soft.  Lymphadenopathy:      Cervical: No cervical adenopathy.  Skin:    General: Skin is warm and dry.  Neurological:     Mental Status: She is alert and oriented to person, place, and time.  Psychiatric:        Mood and Affect: Mood normal.        Behavior: Behavior normal.    Filed Weights   08/16/21 1435 08/18/21 0000  Weight: 72.1 kg 72.6 kg     Intake/Output Summary (Last 24 hours) at 08/18/2021 1244 Last data filed at 08/18/2021 1100 Gross per 24 hour  Intake 820 ml  Output 600 ml  Net 220 ml    Net IO Since Admission: -5 mL [08/18/21 1244]  Pertinent Labs: CBC Latest Ref Rng & Units 08/18/2021 08/17/2021 08/16/2021  WBC 4.0 - 10.5 K/uL 14.2(H) 10.9(H) 10.4  Hemoglobin 12.0 - 15.0 g/dL 9.6(L) 10.2(L) 10.4(L)  Hematocrit 36.0 - 46.0 % 31.4(L) 32.2(L) 33.3(L)  Platelets 150 - 400 K/uL 204 217 209    CMP Latest Ref Rng & Units 08/18/2021 08/17/2021 08/16/2021  Glucose 70 - 99 mg/dL 91 98 154(H)  BUN 8 - 23 mg/dL 33(H) 29(H) 33(H)  Creatinine 0.44 - 1.00 mg/dL 1.36(H) 1.20(H) 1.47(H)  Sodium 135 - 145 mmol/L 133(L) 136 134(L)  Potassium 3.5 - 5.1 mmol/L 3.8 3.8 4.2  Chloride 98 - 111 mmol/L 100 102 98  CO2 22 - 32 mmol/L 24 25 26   Calcium 8.9 - 10.3 mg/dL 7.5(L) 7.7(L) 7.8(L)  Total Protein 6.5 - 8.1 g/dL - - 5.5(L)  Total Bilirubin 0.3 - 1.2 mg/dL - - 0.7  Alkaline Phos 38 - 126 U/L - - 106  AST 15 - 41 U/L - - 23  ALT 0 - 44 U/L - - 15    Imaging: ECHOCARDIOGRAM COMPLETE  Result Date: 08/17/2021    ECHOCARDIOGRAM REPORT   Patient Name:   TARIYAH PENDRY Date of Exam: 08/17/2021 Medical Rec #:  [id number]          Height:       65.0 in Accession #:    [id number]         Weight:       159.0 lb Date of Birth:  07-24-1924          BSA:          1.794 m Patient Age:    87 years           BP:           142/74 mmHg Patient Gender: F                  HR:           97 bpm. Exam Location:  Inpatient Procedure: 2D Echo, Color Doppler, Cardiac Doppler and Intracardiac            Opacification Agent  Indications:    CHF  History:        Patient has prior history of Echocardiogram examinations. CAD                 and Previous Myocardial Infarction, Arrythmias:Atrial                 Fibrillation, Signs/Symptoms:Chest Pain; Risk                 Factors:Hypertension.  Sonographer:    Jyl Heinz Referring Phys: [id number] DAN FLOYD IMPRESSIONS  1. Left ventricular ejection fraction, by estimation, is 55 to 60%. The left ventricle has normal function. The left ventricle has no regional wall motion abnormalities. Left ventricular diastolic parameters are indeterminate.  2. Right ventricular systolic function is mildly reduced. The right ventricular size is normal.  3. Left atrial size was mildly dilated.  4. The mitral valve is normal in structure. Trivial mitral valve regurgitation. No evidence of mitral stenosis.  5. The aortic valve is normal in structure. Aortic valve regurgitation is trivial. Mild aortic valve sclerosis is present, with no evidence of aortic valve stenosis.  6. The inferior vena cava is normal in size with greater than 50% respiratory variability, suggesting right atrial pressure of 3 mmHg. FINDINGS  Left Ventricle: Left ventricular ejection fraction, by estimation, is 55 to 60%. The left ventricle has normal function. The left ventricle has no regional wall motion abnormalities. The left ventricular internal cavity size was normal in size. There is  no left ventricular hypertrophy. Left ventricular diastolic function could not be evaluated due to atrial fibrillation. Left ventricular diastolic parameters are indeterminate. Right Ventricle: The right ventricular size is normal. No increase in right ventricular wall thickness. Right ventricular systolic function is mildly reduced. Left Atrium: Left atrial size was mildly dilated. Right Atrium: Right atrial size was normal in size. Pericardium: There is no evidence of pericardial effusion. Mitral Valve: The mitral valve is normal in  structure.  Trivial mitral valve regurgitation. No evidence of mitral valve stenosis. Tricuspid Valve: The tricuspid valve is normal in structure. Tricuspid valve regurgitation is not demonstrated. No evidence of tricuspid stenosis. Aortic Valve: The aortic valve is normal in structure. Aortic valve regurgitation is trivial. Aortic regurgitation PHT measures 489 msec. Mild aortic valve sclerosis is present, with no evidence of aortic valve stenosis. Aortic valve peak gradient measures 5.2 mmHg. Pulmonic Valve: The pulmonic valve was normal in structure. Pulmonic valve regurgitation is not visualized. No evidence of pulmonic stenosis. Aorta: The aortic root is normal in size and structure. Venous: The inferior vena cava is normal in size with greater than 50% respiratory variability, suggesting right atrial pressure of 3 mmHg. IAS/Shunts: No atrial level shunt detected by color flow Doppler.  LEFT VENTRICLE PLAX 2D LVIDd:         4.40 cm     Diastology LVIDs:         3.60 cm     LV e' medial:    6.31 cm/s LV PW:         1.00 cm     LV E/e' medial:  11.4 LV IVS:        1.50 cm     LV e' lateral:   6.31 cm/s LVOT diam:     2.00 cm     LV E/e' lateral: 11.4 LV SV:         52 LV SV Index:   29 LVOT Area:     3.14 cm  LV Volumes (MOD) LV vol d, MOD A2C: 72.7 ml LV vol d, MOD A4C: 71.4 ml LV vol s, MOD A2C: 32.6 ml LV vol s, MOD A4C: 29.4 ml LV SV MOD A2C:     40.1 ml LV SV MOD A4C:     71.4 ml LV SV MOD BP:      43.9 ml RIGHT VENTRICLE            IVC RV Basal diam:  3.50 cm    IVC diam: 1.80 cm RV Mid diam:    2.80 cm RV S prime:     6.13 cm/s TAPSE (M-mode): 1.3 cm LEFT ATRIUM             Index        RIGHT ATRIUM           Index LA diam:        4.00 cm 2.23 cm/m   RA Area:     15.00 cm LA Vol (A2C):   54.6 ml 30.43 ml/m  RA Volume:   39.90 ml  22.24 ml/m LA Vol (A4C):   62.6 ml 34.89 ml/m LA Biplane Vol: 62.5 ml 34.83 ml/m  AORTIC VALVE AV Area (Vmax): 2.87 cm AV Vmax:        114.00 cm/s AV Peak Grad:   5.2 mmHg LVOT Vmax:       104.00 cm/s LVOT Vmean:     77.500 cm/s LVOT VTI:       0.165 m AI PHT:         489 msec  AORTA Ao Root diam: 2.90 cm Ao Asc diam:  2.90 cm MITRAL VALVE               TRICUSPID VALVE MV Area (PHT): 4.74 cm    TR Peak grad:   20.1 mmHg MV Decel Time: 160 msec    TR Vmax:        224.00 cm/s MV E velocity:  71.70 cm/s                            SHUNTS                            Systemic VTI:  0.16 m                            Systemic Diam: 2.00 cm Kardie Tobb DO Electronically signed by Berniece Salines DO Signature Date/Time: 08/17/2021/4:47:19 PM    Final     Assessment/Plan:   Principal Problem:   Acute on chronic diastolic (congestive) heart failure (HCC) Active Problems:   History of breast cancer   AKI (acute kidney injury) (HCC)   Pleural effusion on right   Atrial fibrillation [facility])    Patient Summary: Tonya Cruz is a 87 y.o. with a pertinent PMH of R sided breast cancer S/P lumpectomy and radiation, hypertension, hyperlipidemia, NSTEMI 2018, HFpEF, CAD with chronically occluded RCA with LAD DES, A. fib on apixaban, hypothyroidism, chronic anemia, who presented with shortness of breath and admitted for acute on chronic diastolic heart failure exacerbation.   #Acute on chronic diastolic heart failure exacerbation #Moderate-sized right pleural effusion #History Stage 1 right-sided breast cancer 2001 status post lumpectomy and radiation Patient presented with shortness of breath, orthopnea, PND, lower extremity edema.  Improved shortness of breath, now on room air.  Given patient did not appear fluid overloaded, relapse of breast cancer continues to be a possible cause of right pleural effusion. Patient and family were not interested in aggressive evaluation or treatment. Patient does not appear to require additional lasix challenge as she appears euvolemic.  Plan: -O2 supplementation as needed -Strict ins and outs -Daily weights -PT recommending SNF    #Hypertension #Hyperlipidemia #CAD, history NSTEMI, chronically occluded RCA, LAD DES Denies chest pain.  EKG without ischemic changes.  Follows with cardiology Dr. Tamala Julian, last OV 10/17. Plan: -Continue Lipitor 40 mg daily -Continue Coreg 6.25 mg twice daily -Continue diltiazem 240 mg daily -Continue Imdur 60 mg daily   #UTI -Bactrim 800-160 mg q12 hours x3 days  #Paroxysmal A. Fib Currently in atrial fibrillation.  EKG shows A. fib.  Generalized weakness for last few weeks could be related to symptomatic A. fib. Plan: -Eliquis 5 mg bid given patient's serum creatinine <1.5 -Continue home med Coreg 6.25 twice daily with meals -Continue home med diltiazem 24-hour capsule 240 milligrams daily    #Depression with insomnia #Anxiety -Ativan 0.5 mg nightly as needed -Buproprion 75 qd   #Hypothyroidism Patient reports taking medication appropriately 30 minutes before breakfast and any other medications. -11/10 TSH 3.086 -Continue Synthroid 100 MCG daily -Continue home bowel regimen senna twice daily, MiraLAX as needed   #Postherpetic neuralgia -Continue Lyrica 75 mg 3 times daily   #GERD -Continue Protonix 40 mg daily    Diet: Heart Healthy IVF: None,None VTE: SCDs Code: DNR PT/OT recs: Pending, none. TOC recs: pending   Dispo: Anticipated discharge to Skilled nursing facility in 5 days pending improved breathing and reduction in volume overload status.   France Ravens, MD 08/18/2021, 12:44 PM Pager: [phone]  Please contact the on call pager after 5 pm and on weekends at [phone].

## 2021-08-18 NOTE — Evaluation (Signed)
Physical Therapy Evaluation Patient Details Name: Tonya Cruz MRN: [id number] DOB: 10-26-1923 Today's Date: 08/18/2021  History of Present Illness  87 year old female presents to [facility] ED 08/16/21 for shortness of breath, difficulty sleeping at night. In ED CTA negative for PE, moderate right pleural effusion with atelectasis posterior right lower lobe. CXR central vascular congestion and found to have asymptomatic UTI Admitted for treatment of Acute on chronic diastolic heart failure, moderate sized pleural effusion, PMHx of R sided breast cancer 2001 S/P lumpectomy and radiation, hypertension, hyperlipidemia, NSTEMI 2018, HFpEF, CAD with chronically occluded RCA with LAD DES, A. fib on apixaban, hypothyroidism, chronic anemia  Clinical Impression  PTA pt living alone in single story home with step to enter. Pt is a household ambulator with RW and has personal care assistant to helps with bADLs and iADLs. Pt is currently limited in safe mobility by generalized weakness, impacting balance and endurance. Pt is currently modA for bed mobility and modAx2 for transfers. Gait deferred due to fatigue. PT recommends SNF level rehab at discharge. Pt is in agreement and requests [facility]. PT will continue to follow acutely.      Recommendations for follow up therapy are one component of a multi-disciplinary discharge planning process, led by the attending physician.  Recommendations may be updated based on patient status, additional functional criteria and insurance authorization.  Follow Up Recommendations Skilled nursing-short term rehab (<3 hours/day)       Functional Status Assessment Patient has had a recent decline in their functional status and demonstrates the ability to make significant improvements in function in a reasonable and predictable amount of time.  Equipment Recommendations  None recommended by PT    Recommendations for Other Services OT consult     Precautions /  Restrictions Precautions Precautions: Fall Restrictions Weight Bearing Restrictions: No      Mobility  Bed Mobility Overal bed mobility: Needs Assistance Bed Mobility: Supine to Sit     Supine to sit: Mod assist     General bed mobility comments: Pt able to move legs off EOB w CGA. Supine to sit required mod A to bring trunk upright by pulling against therapist.    Transfers Overall transfer level: Needs assistance Equipment used: Rolling walker (2 wheels);2 person hand held assist;1 person hand held assist Transfers: Sit to/from Stand;Bed to chair/wheelchair/BSC Sit to Stand: Mod assist;+2 physical assistance;+2 safety/equipment;From elevated surface   Step pivot transfers: Mod assist;+2 physical assistance Squat pivot transfers: Mod assist;+2 physical assistance;+2 safety/equipment;From elevated surface     General transfer comment: Pt performed sit to stand transfer w mod A w 2+ assistance and rolling walker and required significant multimodal cueing for hand placement. squat pivot transfer performed w mod A w 2+ assistance to [facility] and back to bed. step pivot transfer from bed to recliner w mod Ax2 w verbal cueing for sequencing    Ambulation/Gait               General Gait Details: deferred        Balance Overall balance assessment: Needs assistance;History of Falls Sitting-balance support: No upper extremity supported;Feet unsupported;Feet supported     Postural control: Other (comment) (flexed forward posture) Standing balance support: Bilateral upper extremity supported;During functional activity;Reliant on assistive device for balance Standing balance-Leahy Scale: Poor                               Pertinent Vitals/Pain Pain Assessment: Faces (Pt  reports HA, but has received medication and states it is getting better) Faces Pain Scale: Hurts little more Pain Location: head Pain Descriptors / Indicators: Headache Pain Intervention(s):  Premedicated before session;Monitored during session    [place] expects to be discharged to:: (P) Private residence Living Arrangements: (P) Alone Available Help at Discharge: Personal care attendant (has assistance w cleaning and cooking) Type of Home: (P) House Home Access: Stairs to enter   [employer] of Steps: 1   Home Layout: One level;Laundry or work area in basement;Able to live on main level with bedroom/bathroom Home Equipment: BSC/3in1;Grab bars - Statistician (2 wheels);Shower seat      Prior Function Prior Level of Function : Needs assist       Physical Assist : ADLs (physical)   ADLs (physical): IADLs Mobility Comments: household ambulator, uses rolling walker at baseline, ADLs Comments: cooking, cleaning,med management, dressing, bathing     Hand Dominance   Dominant Hand: (P) Right    Extremity/Trunk Assessment   Upper Extremity Assessment Upper Extremity Assessment: Generalized weakness    Lower Extremity Assessment Lower Extremity Assessment: RLE deficits/detail;LLE deficits/detail RLE Deficits / Details: ROM WFL, strength grossly assesed in motion 3+/5 RLE Sensation:  (Pt reports numbness in toes) LLE Deficits / Details: ROM WFL, strength grossly assesed in motion 3+/5 LLE Sensation:  (Pt reports numbness in toes)    Cervical / Trunk Assessment Cervical / Trunk Assessment: Kyphotic  Communication   Communication: HOH (Pt having difficulty finishing sentences due to SOB.)  Cognition Arousal/Alertness: Awake/alert Behavior During Therapy: WFL for tasks assessed/performed Overall Cognitive Status: Within Functional Limits for tasks assessed                                 General Comments:  ([place]ant during session.)        General Comments General comments (skin integrity, edema, etc.): Pt on 2L O2 [place] on entry SpO2 96%, removed [place] SpO2 on room air 92%. SpO2 w mobility 90% on RA. VSS     Exercises General Exercises - Lower Extremity Ankle Circles/Pumps: AROM;Both;Supine;Other reps (comment) (diagnostic) Heel Slides: AROM;Both;Supine;Other reps (comment) Hip Flexion/Marching: AROM;Both;Supine;Other reps (comment)   Assessment/Plan    PT Assessment Patient needs continued PT services  PT Problem List Decreased strength;Decreased balance;Impaired sensation;Decreased activity tolerance;Decreased mobility;Decreased coordination;Decreased knowledge of use of DME;Decreased safety awareness       PT Treatment Interventions DME instruction;Gait training;Stair training;Functional mobility training;Therapeutic activities;Therapeutic exercise;Balance training;Patient/family education    PT Goals (Current goals can be found in the Care Plan section)  Acute Rehab PT Goals Patient Stated Goal: Pt wants to go home eventually PT Goal Formulation: With patient Time For Goal Achievement: 09/01/21 Potential to Achieve Goals: Fair    Frequency Min 2X/week   Barriers to discharge        Co-evaluation               AM-PAC PT "6 Clicks" Mobility  Outcome Measure Help needed turning from your back to your side while in a flat bed without using bedrails?: A Lot Help needed moving from lying on your back to sitting on the side of a flat bed without using bedrails?: A Lot Help needed moving to and from a bed to a chair (including a wheelchair)?: Total Help needed standing up from a chair using your arms (e.g., wheelchair or bedside chair)?: Total Help needed to walk in hospital room?: Total Help  needed climbing 3-5 steps with a railing? : Total 6 Click Score: 8    End of Session Equipment Utilized During Treatment: Gait belt Activity Tolerance: Patient tolerated treatment well Patient left: in chair;with call bell/phone within reach;with chair alarm set Nurse Communication: Mobility status;Other (comment) (O2 sat, urine collection) PT Visit Diagnosis: Unsteadiness on feet  (R26.81);Repeated falls (R29.6);Muscle weakness (generalized) (M62.81);History of falling (Z91.81);Difficulty in walking, not elsewhere classified (R26.2);Adult, failure to thrive (R62.7)    Time: 6190-1222 PT Time Calculation (min) (ACUTE ONLY): 49 min   Charges:   PT Evaluation $PT Eval Moderate Complexity: 1 Mod PT Treatments $Therapeutic Activity: 23-37 mins        Cintia B. Migdalia Dk PT, DPT Acute Rehabilitation Services Pager [phone] Office [phone]   [place] 08/18/2021, 12:07 PM

## 2021-08-18 NOTE — Progress Notes (Signed)
SATURATION QUALIFICATIONS: (This note is used to comply with regulatory documentation for home oxygen)  Patient Saturations on Room Air at Rest = 92%  Patient Saturations on Room Air while Ambulating = 90%   Please briefly explain why patient needs home oxygen: While ambulating pt did not required O2. Pt is currently on room air with oxygen saturation of 94%.

## 2021-08-19 ENCOUNTER — Inpatient Hospital Stay (HOSPITAL_COMMUNITY): Payer: Medicare Other

## 2021-08-19 DIAGNOSIS — N390 Urinary tract infection, site not specified: Secondary | ICD-10-CM

## 2021-08-19 DIAGNOSIS — I110 Hypertensive heart disease with heart failure: Principal | ICD-10-CM

## 2021-08-19 DIAGNOSIS — I480 Paroxysmal atrial fibrillation: Secondary | ICD-10-CM

## 2021-08-19 DIAGNOSIS — E785 Hyperlipidemia, unspecified: Secondary | ICD-10-CM

## 2021-08-19 LAB — CBC
HCT: 31.8 % — ABNORMAL LOW (ref 36.0–46.0)
Hemoglobin: 10 g/dL — ABNORMAL LOW (ref 12.0–15.0)
MCH: 27 pg (ref 26.0–34.0)
MCHC: 31.4 g/dL (ref 30.0–36.0)
MCV: 85.7 fL (ref 80.0–100.0)
Platelets: 234 10*3/uL (ref 150–400)
RBC: 3.71 MIL/uL — ABNORMAL LOW (ref 3.87–5.11)
RDW: 16.1 % — ABNORMAL HIGH (ref 11.5–15.5)
WBC: 13.8 10*3/uL — ABNORMAL HIGH (ref 4.0–10.5)
nRBC: 0 % (ref 0.0–0.2)

## 2021-08-19 LAB — BASIC METABOLIC PANEL
Anion gap: 10 (ref 5–15)
BUN: 36 mg/dL — ABNORMAL HIGH (ref 8–23)
CO2: 25 mmol/L (ref 22–32)
Calcium: 7.9 mg/dL — ABNORMAL LOW (ref 8.9–10.3)
Chloride: 97 mmol/L — ABNORMAL LOW (ref 98–111)
Creatinine, Ser: 1.24 mg/dL — ABNORMAL HIGH (ref 0.44–1.00)
GFR, Estimated: 40 mL/min — ABNORMAL LOW (ref >60)
Glucose, Bld: 78 mg/dL (ref 70–99)
Potassium: 4.2 mmol/L (ref 3.5–5.1)
Sodium: 132 mmol/L — ABNORMAL LOW (ref 135–145)

## 2021-08-19 LAB — GLUCOSE, CAPILLARY
Glucose-Capillary: 94 mg/dL (ref 70–99)
Glucose-Capillary: 101 mg/dL — ABNORMAL HIGH (ref 70–99)
Glucose-Capillary: 100 mg/dL — ABNORMAL HIGH (ref 70–99)
Glucose-Capillary: 118 mg/dL — ABNORMAL HIGH (ref 70–99)

## 2021-08-19 NOTE — Progress Notes (Addendum)
HD#3 Subjective:  Overnight Events: none  Patient appears comfortable and in no acute distress. Patient is feeling well. Breathing well. No abd pain. Sleep well. She is eager to go today.  Patient states that she requires oxygen while sleeping but is otherwise on room air.  Discussed plan with patient to follow-up with social work to assess patient's current options for going to SNF.  Discussed possibility that she may have to go to [facility] Monday instead of today.  Patient verbalized understanding and no other questions at this time.   Objective:  Vital signs in last 24 hours: Vitals:   08/18/21 1644 08/18/21 1949 08/19/21 0049 08/19/21 0339  BP: 136/66 (!) 128/38  (!) 125/114  Pulse: 82 85  76  Resp: 20 18  (!) 24  Temp: 98.4 F (36.9 C) 97.8 F (36.6 C)  98.5 F (36.9 C)  TempSrc: Oral Oral  Oral  SpO2: 94% 92%  96%  Weight:   75.3 kg   Height:       Supplemental O2: Nasal Cannula SpO2: 96 % O2 Flow Rate (L/min): 2 L/min   Physical Exam:  Physical Exam Constitutional:      General: She is not in acute distress.    Appearance: Normal appearance. She is not ill-appearing.  HENT:     Nose: No congestion or rhinorrhea.  Cardiovascular:     Rate and Rhythm: Normal rate. Rhythm irregular.     Pulses: Normal pulses.     Heart sounds: Normal heart sounds.  Pulmonary:     Breath sounds: Wheezing present.  Chest:  Breasts:    Breasts are asymmetrical.     Right: Inverted nipple, nipple discharge and skin change present.     Comments: Multiple left cherry angioma Right thickened skin changes on breast No lymphadenopathy noted on either side Abdominal:     General: Bowel sounds are normal.     Palpations: Abdomen is soft.  Lymphadenopathy:     Cervical: No cervical adenopathy.  Skin:    General: Skin is warm and dry.  Neurological:     Mental Status: She is alert and oriented to person, place, and time.  Psychiatric:        Mood and Affect: Mood normal.         Behavior: Behavior normal.    Filed Weights   08/16/21 1435 08/18/21 0000 08/19/21 0049  Weight: 72.1 kg 72.6 kg 75.3 kg     Intake/Output Summary (Last 24 hours) at 08/19/2021 0655 Last data filed at 08/19/2021 0351 Gross per 24 hour  Intake 600 ml  Output 700 ml  Net -100 ml    Net IO Since Admission: -45 mL [08/19/21 0655]  Pertinent Labs: CBC Latest Ref Rng & Units 08/19/2021 08/18/2021 08/17/2021  WBC 4.0 - 10.5 K/uL 13.8(H) 14.2(H) 10.9(H)  Hemoglobin 12.0 - 15.0 g/dL 10.0(L) 9.6(L) 10.2(L)  Hematocrit 36.0 - 46.0 % 31.8(L) 31.4(L) 32.2(L)  Platelets 150 - 400 K/uL 234 204 217    CMP Latest Ref Rng & Units 08/19/2021 08/18/2021 08/17/2021  Glucose 70 - 99 mg/dL 78 91 98  BUN 8 - 23 mg/dL 36(H) 33(H) 29(H)  Creatinine 0.44 - 1.00 mg/dL 1.24(H) 1.36(H) 1.20(H)  Sodium 135 - 145 mmol/L 132(L) 133(L) 136  Potassium 3.5 - 5.1 mmol/L 4.2 3.8 3.8  Chloride 98 - 111 mmol/L 97(L) 100 102  CO2 22 - 32 mmol/L 25 24 25   Calcium 8.9 - 10.3 mg/dL 7.9(L) 7.5(L) 7.7(L)  Total Protein 6.5 -  8.1 g/dL - - -  Total Bilirubin 0.3 - 1.2 mg/dL - - -  Alkaline Phos 38 - 126 U/L - - -  AST 15 - 41 U/L - - -  ALT 0 - 44 U/L - - -    Imaging: US Abdomen Complete  Result Date: 08/19/2021 CLINICAL DATA:  Right upper quadrant pain EXAM: ABDOMEN ULTRASOUND COMPLETE COMPARISON:  [facility]val [facility] CT abdomen/pelvis dated 02/23/2014 FINDINGS: Gallbladder: Not discretely visualized. Common bile duct: Diameter: 5 mm Liver: 7 x 6 x 6 mm echogenic lesion in the left hepatic lobe, favoring a benign hemangioma. Within normal limits for parenchymal echogenicity. Portal vein is patent on color Doppler imaging with normal direction of blood flow towards the liver. IVC: No abnormality visualized. Pancreas: Incompletely visualized but grossly unremarkable. Spleen: Size and appearance within normal limits. Right Kidney: Length: 10.1 cm. Echogenicity within normal limits. No mass or hydronephrosis visualized. Left  Kidney: Length: 8.7 cm. Echogenicity within normal limits. 2.3 x 2.4 x 2.2 cm lower pole simple cyst. No hydronephrosis. Abdominal aorta: No aneurysm visualized. Other findings: Right pleural effusion. IMPRESSION: Gallbladder not discretely visualized. 7 mm lesion in the left hepatic lobe, favoring a benign hemangioma. 2.4 cm left lower pole renal cyst, simple/benign. Electronically Signed   By: Julian Hy M.D.   On: 08/19/2021 03:15    Assessment/Plan:   Principal Problem:   Acute on chronic diastolic (congestive) heart failure (HCC) Active Problems:   History of breast cancer   AKI (acute kidney injury) ([place])   Pleural effusion on right   Atrial fibrillation [facility])    Patient Summary: Tonya Cruz is a 87 y.o. with a pertinent PMH of R sided breast cancer S/P lumpectomy and radiation, hypertension, hyperlipidemia, NSTEMI 2018, HFpEF, CAD with chronically occluded RCA with LAD DES, A. fib on apixaban, hypothyroidism, chronic anemia, who presented with shortness of breath and admitted for acute on chronic diastolic heart failure exacerbation.   #Small to moderate-sized right pleural effusion likely 2/2 malignancy of right breast vs acute on chronic HFpEF #History Stage 1 right-sided breast cancer 2001 status post lumpectomy and radiation #Possible OSA Patient presented with shortness of breath, orthopnea, PND, lower extremity edema.  Improved shortness of breath, now on room air.  Patient reports requiring oxygen at night concerning for OSA. Patient and family were not interested in aggressive evaluation or treatment. Patient does not appear to require additional lasix challenge as she appears euvolemic.  Plan: -O2 supplementation as needed -Strict ins and outs -Resuming home med Lasix 20 mg daily. -Daily weights -PT recommending SNF   #Hypertension #Hyperlipidemia #CAD, history NSTEMI, chronically occluded RCA, LAD DES Denies chest pain.  EKG without ischemic changes.   Follows with cardiology Dr. Tamala Julian, last OV 10/17. Plan: -Continue Lipitor 40 mg daily -Continue Coreg 6.25 mg twice daily -Continue diltiazem 240 mg daily -Continue Imdur 60 mg daily   #UTI -Bactrim 800-160 mg q12 hours x3 days  #Paroxysmal A. Fib Currently in atrial fibrillation.  EKG shows A. fib.  Generalized weakness for last few weeks could be related to symptomatic A. fib. Plan: -Eliquis 5 mg bid given patient's serum creatinine <1.5 -Continue home med Coreg 6.25 twice daily with meals -Continue home med diltiazem 24-hour capsule 240 milligrams daily    #Depression with insomnia #Anxiety -Ativan 0.5 mg nightly as needed.  Patient has not required during admission. -Buproprion 75 qd   #Hypothyroidism Patient reports taking medication appropriately 30 minutes before breakfast and any other medications. -11/10 TSH 3.086 -Continue  Synthroid 100 MCG daily -Continue home bowel regimen senna twice daily, MiraLAX as needed   #Postherpetic neuralgia -Continue Lyrica 75 mg 3 times daily   #GERD -Continue Protonix 40 mg daily    Diet: Heart Healthy IVF: None,None VTE: SCDs Code: DNR PT/OT recs: Pending, none. TOC recs: pending   Dispo: Anticipated discharge to Skilled nursing facility in 5 days pending improved breathing and reduction in volume overload status.   Tonya Ravens, MD 08/19/2021, 6:55 AM Pager: [phone]  Please contact the on call pager after 5 pm and on weekends at [phone].

## 2021-08-19 NOTE — Plan of Care (Signed)

## 2021-08-19 NOTE — NC FL2 (Signed)
[place] LEVEL OF CARE SCREENING TOOL     IDENTIFICATION  Patient Name: Tonya Cruz Birthdate: [birth year] Sex: female Admission Date (Current Location): 08/16/2021  [facility] and [place] Number:  Herbalist and Address:  [facility], [place] [address], [place], [place] [address]      Provider Number: [id number]  Attending Physician Name and Address:  Axel Filler, *  Relative Name and Phone Number:  Teena Irani [phone]    Current Level of Care: Hospital Recommended Level of Care: [place] Prior Approval Number:    Date Approved/Denied:   PASRR Number: [id number]  Discharge Plan: SNF    Current Diagnoses: Patient Active Problem List   Diagnosis Date Noted   Acute on chronic diastolic (congestive) heart failure ([place]) 08/16/2021   AKI (acute kidney injury) ([place]) 08/16/2021   Pleural effusion on right 08/16/2021   Atrial fibrillation ([place]) 08/16/2021   Breast cancer of upper-outer quadrant of right female breast ([place]) 10/26/2020   History of breast cancer 10/19/2020   Lymphedema of right upper extremity 10/14/2020   Extensor tendonitis of foot 01/19/2020   Hypertrophic and atrophic condition of skin 01/19/2020   Chest pain 07/06/2017   Acute hyponatremia 07/06/2017   GERD (gastroesophageal reflux disease) 07/06/2017   Hypothyroidism 07/06/2017   Hyperlipemia 06/12/2017   CAD (coronary artery disease), native coronary artery 06/12/2017   Hypertension 06/08/2017   Old MI (myocardial infarction) 06/08/2017    Orientation RESPIRATION BLADDER Height & Weight     Self, Time, Situation, Place  O2 (2L Nasal Cannula) Continent Weight: 161 lb 11 oz (73.3 kg) Height:  5\' 5"  (165.1 cm)  BEHAVIORAL SYMPTOMS/MOOD NEUROLOGICAL BOWEL NUTRITION STATUS      Continent    AMBULATORY STATUS COMMUNICATION OF NEEDS Skin   Limited Assist Verbally Normal                       Personal Care  Assistance Level of Assistance  Bathing, Feeding, Dressing Bathing Assistance: Limited assistance Feeding assistance: Independent Dressing Assistance: Limited assistance     Functional Limitations Info  Sight, Hearing, Speech Sight Info: Adequate Hearing Info: Adequate Speech Info: Adequate    SPECIAL CARE FACTORS FREQUENCY  PT (By licensed PT), OT (By licensed OT)     PT Frequency: 5x aweek OT Frequency: 5x a week            Contractures Contractures Info: Not present    Additional Factors Info  Code Status, Allergies Code Status Info: DNR Allergies Info: Celexa (citalopram Hydrobromide), Penicillins, Ciprofloxacin Hcl           Current Medications (08/19/2021):  This is the current hospital active medication list Current Facility-Administered Medications  Medication Dose Route Frequency Provider Last Rate Last Admin   acetaminophen (TYLENOL) tablet 650 mg  650 mg Oral Q6H PRN Rehman, Areeg N, DO   650 mg at 08/18/21 0350   Or   acetaminophen (TYLENOL) suppository 650 mg  650 mg Rectal Q6H PRN Rehman, Areeg N, DO       apixaban (ELIQUIS) tablet 5 mg  5 mg Oral BID France Ravens, MD   5 mg at 08/19/21 0900   atorvastatin (LIPITOR) tablet 40 mg  40 mg Oral Daily Rehman, Areeg N, DO   40 mg at 08/19/21 0900   buPROPion (WELLBUTRIN) tablet 75 mg  75 mg Oral Daily France Ravens, MD   75 mg at 08/19/21 0900   carvedilol (  COREG) tablet 6.25 mg  6.25 mg Oral BID WC Rehman, Areeg N, DO   6.25 mg at 08/19/21 0859   diltiazem (CARDIZEM CD) 24 hr capsule 240 mg  240 mg Oral Daily Rehman, Areeg N, DO   240 mg at 08/19/21 0900   furosemide (LASIX) tablet 20 mg  20 mg Oral Daily France Ravens, MD   20 mg at 08/19/21 0900   ipratropium-albuterol (DUONEB) 0.5-2.5 (3) MG/3ML nebulizer solution 3 mL  3 mL Nebulization Q6H PRN France Ravens, MD       isosorbide mononitrate (IMDUR) 24 hr tablet 60 mg  60 mg Oral Daily Rehman, Areeg N, DO   60 mg at 08/19/21 0900   levothyroxine (SYNTHROID) tablet 100  mcg  100 mcg Oral Daily Rehman, Areeg N, DO   100 mcg at 08/19/21 0603   LORazepam (ATIVAN) tablet 0.5 mg  0.5 mg Oral QHS PRN Wayland Denis, MD       pantoprazole (PROTONIX) EC tablet 40 mg  40 mg Oral Daily Rehman, Areeg N, DO   40 mg at 08/19/21 0901   polyethylene glycol (MIRALAX / GLYCOLAX) packet 17 g  17 g Oral Daily PRN Rehman, Areeg N, DO       pregabalin (LYRICA) capsule 75 mg  75 mg Oral TID Rehman, Areeg N, DO   75 mg at 08/19/21 0900   senna (SENOKOT) tablet 8.6 mg  1 tablet Oral BID Rehman, Areeg N, DO   8.6 mg at 08/19/21 0900   sulfamethoxazole-trimethoprim (BACTRIM DS) 800-160 MG per tablet 1 tablet  1 tablet Oral Q12H France Ravens, MD   1 tablet at 08/19/21 0900     Discharge Medications: Please see discharge summary for a list of discharge medications.  Relevant Imaging Results:  Relevant Lab Results:   Additional Information ss#[id number]; [place] COVID-19 Vaccine 06/10/2020 , 11/16/2019 , 10/23/2019  Reece Agar, LCSWA

## 2021-08-19 NOTE — Evaluation (Signed)
Occupational Therapy Evaluation Patient Details Name: Tonya Cruz MRN: [id number] DOB: Jan 28, 1924 Today's Date: 08/19/2021   History of Present Illness 87 year old female presents to [facility] ED 08/16/21 for shortness of breath, difficulty sleeping at night. In ED CTA negative for PE, moderate right pleural effusion with atelectasis posterior right lower lobe. CXR central vascular congestion and found to have asymptomatic UTI Admitted for treatment of Acute on chronic diastolic heart failure, moderate sized pleural effusion, PMHx of R sided breast cancer 2001 S/P lumpectomy and radiation, hypertension, hyperlipidemia, NSTEMI 2018, HFpEF, CAD with chronically occluded RCA with LAD DES, A. fib on apixaban, hypothyroidism, chronic anemia   Clinical Impression   Patient admitted with the diagnosis above.  She appears to be moving better than the previous dates PT eval.  PTA she lives alone with daily PCA support for 2 hours/day, and check-in from family.  Patient used a RW at baseline, and was generally Mod I with bathing, dressing and toileting.  Her PCA's assist with community mobility, home management and meals.  Deficits impacting independence are listed below.  Currently she is needing up to [facility] for basic mobility in her room at RW level and SOB, and up to Min A with lower body ADL with increased time from sit to stand.  Patient continues to fatigue easily.  OT to follow in the acute setting, and if she continues to progress, home with [facility] OT is recommended.        Recommendations for follow up therapy are one component of a multi-disciplinary discharge planning process, led by the attending physician.  Recommendations may be updated based on patient status, additional functional criteria and insurance authorization.   Follow Up Recommendations  Home health OT    Assistance Recommended at Discharge Intermittent Supervision/Assistance  Functional Status Assessment  Patient has had a recent  decline in their functional status and demonstrates the ability to make significant improvements in function in a reasonable and predictable amount of time.  Equipment Recommendations  None recommended by OT    Recommendations for Other Services       Precautions / Restrictions Precautions Precautions: Fall Precaution Comments: Watch O2 Restrictions Weight Bearing Restrictions: No      Mobility Bed Mobility Overal bed mobility: Needs Assistance Bed Mobility: Supine to Sit     Supine to sit: Supervision;HOB elevated       Patient Response: Cooperative  Transfers Overall transfer level: Needs assistance Equipment used: Rolling walker (2 wheels) Transfers: Sit to/from Stand;Bed to chair/wheelchair/BSC Sit to Stand: Min guard   Squat pivot transfers: Min guard              Balance Overall balance assessment: Needs assistance Sitting-balance support: Feet supported Sitting balance-Leahy Scale: Good     Standing balance support: Bilateral upper extremity supported;Reliant on assistive device for balance Standing balance-Leahy Scale: Poor                             ADL either performed or assessed with clinical judgement   ADL       Grooming: Wash/dry hands;Min guard;Standing       Lower Body Bathing: Minimal assistance;Sit to/from stand       Lower Body Dressing: Minimal assistance;Sit to/from stand   Toilet Transfer: Min guard;Stand-pivot;BSC/3in1;Rolling walker (2 wheels)             General ADL Comments: patient is moving much better than PT eval previous date  Vision Baseline Vision/History: 1 Wears glasses Patient Visual Report: No change from baseline       Perception Perception Perception: Not tested   Praxis Praxis Praxis: Not tested    Pertinent Vitals/Pain Pain Assessment: No/denies pain Pain Intervention(s): Monitored during session     Hand Dominance Right   Extremity/Trunk Assessment Upper Extremity  Assessment Upper Extremity Assessment: Generalized weakness   Lower Extremity Assessment Lower Extremity Assessment: Defer to PT evaluation   Cervical / Trunk Assessment Cervical / Trunk Assessment: Kyphotic   Communication Communication Communication: HOH   Cognition Arousal/Alertness: Awake/alert Behavior During Therapy: WFL for tasks assessed/performed Overall Cognitive Status: Within Functional Limits for tasks assessed                                       General Comments   Patient on RA upon entering.      Exercises     Shoulder Instructions      Home Living Family/patient expects to be discharged to:: Private residence Living Arrangements: Alone Available Help at Discharge: Personal care attendant;Family;Available PRN/intermittently Type of Home: House Home Access: Stairs to enter Entrance Stairs-Number of Steps: 1   Home Layout: One level;Laundry or work area in basement;Able to live on main level with bedroom/bathroom     Bathroom Shower/Tub: Occupational psychologist: Standard Bathroom Accessibility: Yes How Accessible: Accessible via walker Home Equipment: BSC/3in1;Grab bars - tub/shower;Rolling Walker (2 wheels);Shower seat          Prior Functioning/Environment Prior Level of Function : Needs assist       Physical Assist : ADLs (physical)     Mobility Comments: household ambulator, uses rolling walker at baseline, ADLs Comments: cooking, cleaning,med management, dressing, bathing on her own.  PCA assists with light home mangement, community mbility and meals as needed.        OT Problem List: Decreased strength;Decreased activity tolerance;Impaired balance (sitting and/or standing)      OT Treatment/Interventions: Self-care/ADL training;Therapeutic exercise;Therapeutic activities;Balance training;Patient/family education;Energy conservation;DME and/or AE instruction    OT Goals(Current goals can be found in the care  plan section) Acute Rehab OT Goals Patient Stated Goal: Hoping to get back home soon OT Goal Formulation: With patient Time For Goal Achievement: 09/02/21 Potential to Achieve Goals: Good ADL Goals Pt Will Perform Grooming: with set-up;standing Pt Will Perform Lower Body Bathing: with set-up;sit to/from stand Pt Will Perform Lower Body Dressing: with set-up;sit to/from stand Pt Will Transfer to Toilet: with modified independence;ambulating;regular height toilet Pt Will Perform Toileting - Clothing Manipulation and hygiene: with modified independence;sit to/from stand Pt/caregiver will Perform Home Exercise Program: Increased strength;Both right and left upper extremity;With theraband;With written HEP provided;With Supervision  OT Frequency: Min 2X/week   Barriers to D/C:    none noted       Co-evaluation              AM-PAC OT "6 Clicks" Daily Activity     Outcome Measure Help from another person eating meals?: None Help from another person taking care of personal grooming?: A Little Help from another person toileting, which includes using toliet, bedpan, or urinal?: A Little Help from another person bathing (including washing, rinsing, drying)?: A Little Help from another person to put on and taking off regular upper body clothing?: None Help from another person to put on and taking off regular lower body clothing?: A Little 6 Click Score:  20   End of Session Equipment Utilized During Treatment: Gait belt;Rolling walker (2 wheels) Nurse Communication: Mobility status  Activity Tolerance: Patient tolerated treatment well Patient left: in chair;with call bell/phone within reach;with chair alarm set  OT Visit Diagnosis: Unsteadiness on feet (R26.81);Muscle weakness (generalized) (M62.81)                Time: 6962-9528 OT Time Calculation (min): 20 min Charges:  OT General Charges $OT Visit: 1 Visit OT Evaluation $OT Eval Moderate Complexity: 1 Mod  08/19/2021  RP,  OTR/L  Acute Rehabilitation Services  Office:  [phone]   Metta Clines 08/19/2021, 9:44 AM

## 2021-08-19 NOTE — TOC Initial Note (Deleted)
Transition of Care [facility]) - Initial/Assessment Note    Patient Details  Name: Tonya Cruz MRN: [id number] Date of Birth: October 18, 1923  [facility] CM/SW Contact:    Tresa Endo Phone Number: 08/19/2021, 10:45 AM  Clinical Narrative:                 CSW received SNF consult. CSW spoke with pt about DC plan. CSW introduced self and explained role at the hospital. Pt reports that PTA the pt lived at home alone with personal care assistant. PT reports personal care assistant helps with ADLs.   CSW reviewed PT/OT recommendations for SNF. Pt reports wanting to go to SNF in order to get back home. Pt gave CSW permission to fax out to facilities in the area. Pt specifically asked to go to [facility], CSW reached out to [facility]. No answer, CSW left a message and will follow up on SNF's when offers are made.   CSW will continue to follow.          Patient Goals and CMS Choice        Expected Discharge Plan and Services                                                Prior Living Arrangements/Services                       Activities of Daily Living      Permission Sought/Granted                  Emotional Assessment              Admission diagnosis:  Pleural effusion on right [J90] Acute on chronic diastolic (congestive) heart failure (HCC) [I50.33] Patient Active Problem List   Diagnosis Date Noted   Acute on chronic diastolic (congestive) heart failure ([place]) 08/16/2021   AKI (acute kidney injury) ([place]) 08/16/2021   Pleural effusion on right 08/16/2021   Atrial fibrillation ([place]) 08/16/2021   Breast cancer of upper-outer quadrant of right female breast ([place]) 10/26/2020    Class: Diagnosis of   History of breast cancer 10/19/2020    Class: Diagnosis of   Lymphedema of right upper extremity 10/14/2020    Class: Chronic   Extensor tendonitis of foot 01/19/2020   Hypertrophic and atrophic condition of skin  01/19/2020   Chest pain 07/06/2017   Acute hyponatremia 07/06/2017   GERD (gastroesophageal reflux disease) 07/06/2017   Hypothyroidism 07/06/2017   Hyperlipemia 06/12/2017   CAD (coronary artery disease), native coronary artery 06/12/2017   Hypertension 06/08/2017   Old MI (myocardial infarction) 06/08/2017   PCP:  Lowella Dandy, NP Pharmacy:   [facility] [place], [address] AT [place] [address] [place] [address] Phone: [phone] Fax: [phone]     Social Determinants of Health (SDOH) Interventions    Readmission Risk Interventions No flowsheet data found.

## 2021-08-19 NOTE — TOC Initial Note (Signed)
Transition of Care [facility]) - Initial/Assessment Note    Patient Details  Name: ALIZABETH ANTONIO MRN: [id number] Date of Birth: [birth year]  Transition of Care [facility]) CM/SW Contact:    Tresa Endo Phone Number: 08/19/2021, 11:37 AM  Clinical Narrative:                 CSW received SNF consult. CSW spoke with pt about DC plan. CSW introduced self and explained role at the hospital. Pt reports that PTA the pt lived at home alone with personal care assistant. PT reports personal care assistant helps with ADLs.    CSW reviewed PT/OT recommendations for SNF. Pt reports wanting to go to SNF in order to get back home. Pt gave CSW permission to fax out to facilities in the area. Pt specifically asked to go to [facility], CSW reached out to [facility]. No answer, CSW left a message and will follow up on SNF's when offers are made.    CSW will continue to follow.  Expected Discharge Plan: Skilled Nursing Facility Barriers to Discharge: Continued Medical Work up   Patient Goals and CMS Choice Patient states their goals for this hospitalization and ongoing recovery are:: Rehab to return home CMS Medicare.gov Compare Post Acute Care list provided to:: Patient Choice offered to / list presented to : Patient  Expected Discharge Plan and Services Expected Discharge Plan: [place] In-house Referral: Clinical Social Work     Living arrangements for the past 2 months: Single Family Home                                      Prior Living Arrangements/Services Living arrangements for the past 2 months: Single Family Home Lives with:: Self Patient language and need for interpreter reviewed:: Yes Do you feel safe going back to the place where you live?: Yes      Need for Family Participation in Patient Care: No (Comment) Care giver support system in place?: Yes (comment)   Criminal Activity/Legal Involvement Pertinent to Current Situation/Hospitalization: No -  Comment as needed  Activities of Daily Living      Permission Sought/Granted Permission sought to share information with : Family Supports, Customer service manager Permission granted to share information with : Yes, Verbal Permission Granted  Share Information with NAME: Richey,Sue Daughter [phone]  Permission granted to share info w AGENCY: SNF  Permission granted to share info w Relationship: Richey,Sue Daughter [phone]  Permission granted to share info w Contact Information: Richey,Sue Daughter [phone]  Emotional Assessment Appearance:: Appears stated age Attitude/Demeanor/Rapport: Engaged Affect (typically observed): Appropriate Orientation: : Oriented to Place, Oriented to  Time, Oriented to Situation, Oriented to Self Alcohol / Substance Use: Not Applicable Psych Involvement: No (comment)  Admission diagnosis:  Pleural effusion on right [J90] Acute on chronic diastolic (congestive) heart failure (HCC) [I50.33] Patient Active Problem List   Diagnosis Date Noted   Acute on chronic diastolic (congestive) heart failure ([employer]) 08/16/2021   AKI (acute kidney injury) ([place]) 08/16/2021   Pleural effusion on right 08/16/2021   Atrial fibrillation ([place]) 08/16/2021   Breast cancer of upper-outer quadrant of right female breast ([place]) 10/26/2020    Class: Diagnosis of   History of breast cancer 10/19/2020    Class: Diagnosis of   Lymphedema of right upper extremity 10/14/2020    Class: Chronic   Extensor tendonitis of foot 01/19/2020   Hypertrophic and atrophic  condition of skin 01/19/2020   Chest pain 07/06/2017   Acute hyponatremia 07/06/2017   GERD (gastroesophageal reflux disease) 07/06/2017   Hypothyroidism 07/06/2017   Hyperlipemia 06/12/2017   CAD (coronary artery disease), native coronary artery 06/12/2017   Hypertension 06/08/2017   Old MI (myocardial infarction) 06/08/2017   PCP:  Lowella Dandy, NP Pharmacy:   [facility] [place], [place] [facility] [place] [address] [place] [address] Phone: [phone] Fax: [phone]     Social Determinants of Health (SDOH) Interventions    Readmission Risk Interventions No flowsheet data found.

## 2021-08-20 LAB — BASIC METABOLIC PANEL
Anion gap: 7 (ref 5–15)
BUN: 31 mg/dL — ABNORMAL HIGH (ref 8–23)
CO2: 27 mmol/L (ref 22–32)
Calcium: 8.4 mg/dL — ABNORMAL LOW (ref 8.9–10.3)
Chloride: 100 mmol/L (ref 98–111)
Creatinine, Ser: 1.35 mg/dL — ABNORMAL HIGH (ref 0.44–1.00)
GFR, Estimated: 36 mL/min — ABNORMAL LOW (ref >60)
Glucose, Bld: 90 mg/dL (ref 70–99)
Potassium: 4.1 mmol/L (ref 3.5–5.1)
Sodium: 134 mmol/L — ABNORMAL LOW (ref 135–145)

## 2021-08-20 LAB — GLUCOSE, CAPILLARY
Glucose-Capillary: 87 mg/dL (ref 70–99)
Glucose-Capillary: 136 mg/dL — ABNORMAL HIGH (ref 70–99)

## 2021-08-20 LAB — CBC
HCT: 33.3 % — ABNORMAL LOW (ref 36.0–46.0)
Hemoglobin: 10.5 g/dL — ABNORMAL LOW (ref 12.0–15.0)
MCH: 26.9 pg (ref 26.0–34.0)
MCHC: 31.5 g/dL (ref 30.0–36.0)
MCV: 85.2 fL (ref 80.0–100.0)
Platelets: 241 10*3/uL (ref 150–400)
RBC: 3.91 MIL/uL (ref 3.87–5.11)
RDW: 16 % — ABNORMAL HIGH (ref 11.5–15.5)
WBC: 10.9 10*3/uL — ABNORMAL HIGH (ref 4.0–10.5)
nRBC: 0 % (ref 0.0–0.2)

## 2021-08-20 NOTE — Progress Notes (Signed)
HD#4 Subjective:  Overnight Events: none  Patient appeared uncomfortable today.  Patient reports shortness of breath similar to original presentation in the ED.  Patient back on supplemental oxygen.  Patient unable to say full sentences without catching her breath.  Discussed plan with patient to follow-up with social work to assess patient's current options for going to SNF.  Patient verbalized agreement and had no other questions at this time.   Objective:  Vital signs in last 24 hours: Vitals:   08/19/21 1937 08/20/21 0504 08/20/21 0518 08/20/21 1107  BP: (!) 149/90 (!) 143/62  (!) 151/68  Pulse: 81 77  81  Resp: 18 19  18   Temp: 97.6 F (36.4 C) 97.8 F (36.6 C)  97.6 F (36.4 C)  TempSrc: Oral Oral  Oral  SpO2: 97% 97%  97%  Weight:   74.2 kg   Height:       Supplemental O2: Nasal Cannula SpO2: 97 % O2 Flow Rate (L/min): 2 L/min   Physical Exam:  Physical Exam Constitutional:      General: She is not in acute distress.    Appearance: Normal appearance. She is not ill-appearing.  HENT:     Nose: No congestion or rhinorrhea.  Cardiovascular:     Rate and Rhythm: Normal rate. Rhythm irregular.     Pulses: Normal pulses.     Heart sounds: Normal heart sounds.  Pulmonary:     Breath sounds: Normal breath sounds. No wheezing.  Abdominal:     General: Bowel sounds are normal.     Palpations: Abdomen is soft.  Lymphadenopathy:     Cervical: No cervical adenopathy.  Skin:    General: Skin is warm and dry.  Neurological:     Mental Status: She is alert and oriented to person, place, and time.  Psychiatric:        Mood and Affect: Mood normal.        Behavior: Behavior normal.    Filed Weights   08/19/21 0049 08/19/21 0500 08/20/21 0518  Weight: 75.3 kg 73.3 kg 74.2 kg     Intake/Output Summary (Last 24 hours) at 08/20/2021 1340 Last data filed at 08/20/2021 0840 Gross per 24 hour  Intake 460 ml  Output 650 ml  Net -190 ml    Net IO Since  Admission: -595 mL [08/20/21 1340]  Pertinent Labs: CBC Latest Ref Rng & Units 08/20/2021 08/19/2021 08/18/2021  WBC 4.0 - 10.5 K/uL 10.9(H) 13.8(H) 14.2(H)  Hemoglobin 12.0 - 15.0 g/dL 10.5(L) 10.0(L) 9.6(L)  Hematocrit 36.0 - 46.0 % 33.3(L) 31.8(L) 31.4(L)  Platelets 150 - 400 K/uL 241 234 204    CMP Latest Ref Rng & Units 08/20/2021 08/19/2021 08/18/2021  Glucose 70 - 99 mg/dL 90 78 91  BUN 8 - 23 mg/dL 31(H) 36(H) 33(H)  Creatinine 0.44 - 1.00 mg/dL 1.35(H) 1.24(H) 1.36(H)  Sodium 135 - 145 mmol/L 134(L) 132(L) 133(L)  Potassium 3.5 - 5.1 mmol/L 4.1 4.2 3.8  Chloride 98 - 111 mmol/L 100 97(L) 100  CO2 22 - 32 mmol/L 27 25 24   Calcium 8.9 - 10.3 mg/dL 8.4(L) 7.9(L) 7.5(L)  Total Protein 6.5 - 8.1 g/dL - - -  Total Bilirubin 0.3 - 1.2 mg/dL - - -  Alkaline Phos 38 - 126 U/L - - -  AST 15 - 41 U/L - - -  ALT 0 - 44 U/L - - -    Imaging: No results found.  Assessment/Plan:   Principal Problem:   Acute on  chronic diastolic (congestive) heart failure (HCC) Active Problems:   History of breast cancer   AKI (acute kidney injury) ([place])   Pleural effusion on right   Atrial fibrillation [facility])    Patient Summary: Tonya Cruz is a 87 y.o. with a pertinent PMH of R sided breast cancer S/P lumpectomy and radiation, hypertension, hyperlipidemia, NSTEMI 2018, HFpEF, CAD with chronically occluded RCA with LAD DES, A. fib on apixaban, hypothyroidism, chronic anemia, who presented with shortness of breath and admitted for acute on chronic diastolic heart failure exacerbation.   #Small to moderate-sized right pleural effusion likely 2/2 malignancy of right breast vs acute on chronic HFpEF #History Stage 1 right-sided breast cancer 2001 status post lumpectomy and radiation #Possible OSA #Possible paradoxical vocal fold movement Patient presented with shortness of breath, orthopnea, PND, lower extremity edema.  Shortness of breath may be due to paradoxical vocal fold motion that  might be due to anxiety. Plan: -O2 supplementation as needed -Strict ins and outs -Resuming home med Lasix 20 mg daily. -Daily weights -PT recommending SNF   #Hypertension #Hyperlipidemia #CAD, history NSTEMI, chronically occluded RCA, LAD DES Denies chest pain.  EKG without ischemic changes.  Follows with cardiology Dr. Tamala Julian, last OV 10/17. Plan: -Continue Lipitor 40 mg daily -Continue Coreg 6.25 mg twice daily -Continue diltiazem 240 mg daily -Continue Imdur 60 mg daily   #UTI -Finish Bactrim 800-160 mg q12 hours x3 days (3/3)  #Paroxysmal A. Fib Currently in atrial fibrillation.  EKG shows A. fib.  Generalized weakness for last few weeks could be related to symptomatic A. fib. Plan: -Eliquis 5 mg bid given patient's serum creatinine <1.5 -Continue home med Coreg 6.25 twice daily with meals -Continue home med diltiazem 24-hour capsule 240 milligrams daily    #Depression with insomnia #Anxiety -Ativan 0.5 mg nightly as needed.  Patient has not required during admission. -Buproprion 75 qd   #Hypothyroidism Patient reports taking medication appropriately 30 minutes before breakfast and any other medications. -11/10 TSH 3.086 -Continue Synthroid 100 MCG daily -Continue home bowel regimen senna twice daily, MiraLAX as needed   #Postherpetic neuralgia -Continue Lyrica 75 mg 3 times daily   #GERD -Continue Protonix 40 mg daily  Diet: Heart Healthy IVF: None,None VTE: SCDs Code: DNR PT/OT recs: Pending, none. TOC recs: pending   Dispo: Anticipated discharge to Skilled nursing facility in 5 days pending improved breathing and reduction in volume overload status.   France Ravens, MD 08/20/2021, 1:40 PM Pager: [phone]  Please contact the on call pager after 5 pm and on weekends at [phone].

## 2021-08-21 DIAGNOSIS — R2689 Other abnormalities of gait and mobility: Secondary | ICD-10-CM | POA: Diagnosis not present

## 2021-08-21 DIAGNOSIS — M6281 Muscle weakness (generalized): Secondary | ICD-10-CM | POA: Diagnosis not present

## 2021-08-21 DIAGNOSIS — I10 Essential (primary) hypertension: Secondary | ICD-10-CM | POA: Diagnosis not present

## 2021-08-21 DIAGNOSIS — F339 Major depressive disorder, recurrent, unspecified: Secondary | ICD-10-CM | POA: Diagnosis not present

## 2021-08-21 DIAGNOSIS — G609 Hereditary and idiopathic neuropathy, unspecified: Secondary | ICD-10-CM | POA: Diagnosis not present

## 2021-08-21 DIAGNOSIS — F419 Anxiety disorder, unspecified: Secondary | ICD-10-CM | POA: Diagnosis not present

## 2021-08-21 DIAGNOSIS — E785 Hyperlipidemia, unspecified: Secondary | ICD-10-CM | POA: Diagnosis not present

## 2021-08-21 DIAGNOSIS — J90 Pleural effusion, not elsewhere classified: Secondary | ICD-10-CM | POA: Diagnosis not present

## 2021-08-21 DIAGNOSIS — I4891 Unspecified atrial fibrillation: Secondary | ICD-10-CM | POA: Diagnosis not present

## 2021-08-21 DIAGNOSIS — I480 Paroxysmal atrial fibrillation: Secondary | ICD-10-CM | POA: Diagnosis not present

## 2021-08-21 DIAGNOSIS — N179 Acute kidney failure, unspecified: Secondary | ICD-10-CM | POA: Diagnosis not present

## 2021-08-21 DIAGNOSIS — K219 Gastro-esophageal reflux disease without esophagitis: Secondary | ICD-10-CM | POA: Diagnosis not present

## 2021-08-21 DIAGNOSIS — J918 Pleural effusion in other conditions classified elsewhere: Secondary | ICD-10-CM | POA: Diagnosis not present

## 2021-08-21 DIAGNOSIS — M810 Age-related osteoporosis without current pathological fracture: Secondary | ICD-10-CM | POA: Diagnosis not present

## 2021-08-21 DIAGNOSIS — E039 Hypothyroidism, unspecified: Secondary | ICD-10-CM | POA: Diagnosis not present

## 2021-08-21 DIAGNOSIS — D649 Anemia, unspecified: Secondary | ICD-10-CM | POA: Diagnosis not present

## 2021-08-21 DIAGNOSIS — Z7401 Bed confinement status: Secondary | ICD-10-CM | POA: Diagnosis not present

## 2021-08-21 DIAGNOSIS — Z853 Personal history of malignant neoplasm of breast: Secondary | ICD-10-CM | POA: Diagnosis not present

## 2021-08-21 DIAGNOSIS — I5033 Acute on chronic diastolic (congestive) heart failure: Secondary | ICD-10-CM | POA: Diagnosis not present

## 2021-08-21 DIAGNOSIS — R5381 Other malaise: Secondary | ICD-10-CM | POA: Diagnosis not present

## 2021-08-21 DIAGNOSIS — M2550 Pain in unspecified joint: Secondary | ICD-10-CM | POA: Diagnosis not present

## 2021-08-21 DIAGNOSIS — I5032 Chronic diastolic (congestive) heart failure: Secondary | ICD-10-CM | POA: Diagnosis not present

## 2021-08-21 DIAGNOSIS — J383 Other diseases of vocal cords: Secondary | ICD-10-CM | POA: Diagnosis not present

## 2021-08-21 DIAGNOSIS — N1831 Chronic kidney disease, stage 3a: Secondary | ICD-10-CM | POA: Diagnosis not present

## 2021-08-21 DIAGNOSIS — R262 Difficulty in walking, not elsewhere classified: Secondary | ICD-10-CM | POA: Diagnosis not present

## 2021-08-21 LAB — CBC
HCT: 33 % — ABNORMAL LOW (ref 36.0–46.0)
Hemoglobin: 10 g/dL — ABNORMAL LOW (ref 12.0–15.0)
MCH: 26 pg (ref 26.0–34.0)
MCHC: 30.3 g/dL (ref 30.0–36.0)
MCV: 85.7 fL (ref 80.0–100.0)
Platelets: 250 10*3/uL (ref 150–400)
RBC: 3.85 MIL/uL — ABNORMAL LOW (ref 3.87–5.11)
RDW: 15.9 % — ABNORMAL HIGH (ref 11.5–15.5)
WBC: 8.3 10*3/uL (ref 4.0–10.5)
nRBC: 0 % (ref 0.0–0.2)

## 2021-08-21 LAB — BASIC METABOLIC PANEL
Anion gap: 6 (ref 5–15)
BUN: 26 mg/dL — ABNORMAL HIGH (ref 8–23)
CO2: 27 mmol/L (ref 22–32)
Calcium: 8.6 mg/dL — ABNORMAL LOW (ref 8.9–10.3)
Chloride: 99 mmol/L (ref 98–111)
Creatinine, Ser: 1.27 mg/dL — ABNORMAL HIGH (ref 0.44–1.00)
GFR, Estimated: 39 mL/min — ABNORMAL LOW (ref >60)
Glucose, Bld: 88 mg/dL (ref 70–99)
Potassium: 4.5 mmol/L (ref 3.5–5.1)
Sodium: 132 mmol/L — ABNORMAL LOW (ref 135–145)

## 2021-08-21 LAB — RESP PANEL BY RT-PCR (FLU A&B, COVID) ARPGX2
Influenza A by PCR: NEGATIVE
Influenza B by PCR: NEGATIVE
SARS Coronavirus 2 by RT PCR: NEGATIVE

## 2021-08-21 LAB — GLUCOSE, CAPILLARY: Glucose-Capillary: 112 mg/dL — ABNORMAL HIGH (ref 70–99)

## 2021-08-21 MED ORDER — FUROSEMIDE 40 MG PO TABS
40.0000 mg | ORAL_TABLET | Freq: Every day | ORAL | Status: DC
  Administered 2021-08-21: 40 mg via ORAL
  Filled 2021-08-21: qty 1

## 2021-08-21 MED ORDER — FUROSEMIDE 40 MG PO TABS: 40.0000 mg | ORAL_TABLET | Freq: Every day | ORAL | Status: DC

## 2021-08-21 MED ORDER — BUPROPION HCL 75 MG PO TABS: 75.0000 mg | ORAL_TABLET | Freq: Every day | ORAL | 1 refills | Status: AC

## 2021-08-21 NOTE — TOC Transition Note (Signed)
Transition of Care [facility]) - CM/SW Discharge Note   Patient Details  Name: Tonya Cruz MRN: [id number] Date of Birth: [birth year]  Transition of Care [facility]) CM/SW Contact:  Tresa Endo Phone Number: 08/21/2021, 1:11 PM   Clinical Narrative:    Patient will DC to: [facility] [place] Anticipated DC date: 08/21/2021 Family notified: Pt Daughter Transport by: Corey Harold   Per MD patient ready for DC to [facility]. RN to call report prior to discharge [phone]). RN, patient, patient's family, and facility notified of DC. Discharge Summary and FL2 sent to facility. DC packet on chart. Ambulance transport requested for patient.   CSW will sign off for now as social work intervention is no longer needed. Please consult [place] again if new needs arise.     Final next level of care: Skilled Nursing Facility Barriers to Discharge: Continued Medical Work up   Patient Goals and CMS Choice Patient states their goals for this hospitalization and ongoing recovery are:: Rehab to return home CMS Medicare.gov Compare Post Acute Care list provided to:: Patient Choice offered to / list presented to : Patient  Discharge Placement                       Discharge Plan and Services In-house Referral: Clinical Social Work                                   Social Determinants of Health ([place]) Interventions     Readmission Risk Interventions No flowsheet data found.

## 2021-08-21 NOTE — Hospital Course (Addendum)
11/14 feeling good, off supplemental O2 however o2 sat 87-90% Slp to see patient  Only had 700 cc output, inc lasix to 40 mg oral today

## 2021-08-21 NOTE — Consult Note (Signed)
   [facility] Inpatient Consult   08/21/2021  JILLIEN YAKEL 07/25/1924 [id number]  [place] [facility] [ACO] Patient: Medicare  Primary Care Provider:   Lowella Dandy, NP at [place]   If the patient goes to a [facility] affiliated facility then, patient can be followed by [facility] RN with traditional Medicare [place].   Plan:   [facility] PAC RN can follow for any known or needs for transitional care needs for returning to SNF post facility care or complex disease management.  For questions or referrals, please contact:   Natividad Brood, RN BSN [facility] Liaison  [phone] business mobile phone Toll free office [phone]  Fax number: [phone] [email or web address]

## 2021-08-21 NOTE — Discharge Summary (Addendum)
Name: Tonya Cruz MRN: [id number] DOB: 1923-12-15 87 y.o. PCP: Lowella Dandy, NP  Date of Admission: 08/16/2021  2:25 PM Date of Discharge: 08/21/2021 Attending Physician: Velna Ochs, MD  Discharge Diagnosis: 1.  Acute on chronic diastolic heart failure 2.  Right pleural effusion 3.  Right-sided breast cancer 4.  Vocal cord dysfunction 5.  Urinary tract infection 6.  AKI  Discharge Medications: Allergies as of 08/21/2021       Reactions   Celexa [citalopram Hydrobromide] Other (See Comments)   Low sodium   Penicillins    Has patient had a PCN reaction causing immediate rash, facial/tongue/throat swelling, SOB or lightheadedness with hypotension: No Has patient had a PCN reaction causing severe rash involving mucus membranes or skin necrosis: No Has patient had a PCN reaction that required hospitalization: No Has patient had a PCN reaction occurring within the last 10 years: No If all of the above answers are "NO", then may proceed with Cephalosporin use.   Ciprofloxacin Hcl Rash        Medication List     STOP taking these medications    famciclovir 500 MG tablet Commonly known as: FAMVIR       TAKE these medications    acetaminophen 325 MG tablet Commonly known as: TYLENOL Take 650 mg by mouth every 6 (six) hours as needed for moderate pain or headache.   apixaban 5 MG Tabs tablet Commonly known as: Eliquis Take 1 tablet (5 mg total) by mouth 2 (two) times daily.   atorvastatin 40 MG tablet Commonly known as: LIPITOR TAKE 1 TABLET(40 MG) BY MOUTH DAILY What changed:  how much to take how to take this when to take this additional instructions   buPROPion 75 MG tablet Commonly known as: WELLBUTRIN Take 1 tablet (75 mg total) by mouth daily. What changed: when to take this   carvedilol 6.25 MG tablet Commonly known as: COREG TAKE 1 TABLET(6.25 MG) BY MOUTH TWICE DAILY WITH A MEAL What changed: See the new instructions.    cyanocobalamin 1000 MCG/ML injection Commonly known as: (VITAMIN B-12) Inject 1,000 mcg into the muscle every 30 (thirty) days.   diltiazem 240 MG 24 hr capsule Commonly known as: CARDIZEM CD Take 1 capsule (240 mg total) by mouth daily.   furosemide 40 MG tablet Commonly known as: LASIX Take 1 tablet (40 mg total) by mouth daily. Start taking on: August 22, 2021 What changed:  medication strength how much to take how to take this when to take this additional instructions   hydrALAZINE 50 MG tablet Commonly known as: APRESOLINE Take 1 tablet (50 mg total) by mouth in the morning and at bedtime.   isosorbide mononitrate 60 MG 24 hr tablet Commonly known as: IMDUR Take 1 tablet (60 mg total) by mouth daily.   levothyroxine 100 MCG tablet Commonly known as: SYNTHROID Take 100 mcg by mouth daily.   LORazepam 1 MG tablet Commonly known as: ATIVAN Take 1 tablet (1 mg total) by mouth at bedtime. . What changed:  how much to take when to take this   nitroGLYCERIN 0.4 MG SL tablet Commonly known as: NITROSTAT Place 0.4 mg under the tongue every 5 (five) minutes as needed for chest pain.   pantoprazole 40 MG tablet Commonly known as: PROTONIX TAKE 1 TABLET(40 MG) BY MOUTH DAILY What changed: See the new instructions.   predniSONE 5 MG tablet Commonly known as: DELTASONE Take 5-10 mg by mouth See admin instructions. Alternate 5 mg with  10 mg every other day   pregabalin 75 MG capsule Commonly known as: LYRICA Take 75 mg by mouth 3 (three) times daily.        Disposition and follow-up:   Tonya Cruz was discharged from [facility] in Stable condition.  At the hospital follow up visit please address:  1.   Acute on chronic HFpEF AKI-likely cardiorenal Patient presented with with dyspnea, orthopnea and lower extremity edema.  ETA was negative for PE but showed a moderate sized pleural effusion.  We offered thoracentesis but family  declined.  Her oxygen saturations stays at 88-90% on room air. AKI improved with diuresis.  -Reassess volume status and oxygen status -Reassess the pleural effusion -Lasix was increased to 40 mg.  -Recheck BMP at follow-up  Right breast cancer She history of stage Ia right breast cancer status postlumpectomy and radiation therapy in 2001.  Physical exam showed inflammatory changes of her right breast, inversion of nipple concerning for recurrent breast cancer.  Breast MRI was ordered but patient was not able to lay prone for 30 minutes. -Consider reordering breast MRI if family decides to pursue with more imaging  Vocal cord dysfunction / Laryngeal spasm Patient experienced intermittent audible upper airway wheezing with shortness of breath.  Lungs sounded clear at that time.  Suspecting that she has vocal cord dysfunction.  Speech therapist has evaluated her at recommended outpatient speech therapy. -Outpatient speech therapy -Follow-up with ENT if family wants to pursue with treatment  2.  Labs / imaging needed at time of follow-up: BMP, CBC  3.  Pending labs/ test needing follow-up:   Follow-up Appointments:   Hospital Course by problem list:  1.  Acute on chronic heart failure with preserved ejection fraction [name] presented to the hospital for shortness of breath, orthopnea, and lower extremity edema.  Chest x-ray showed pulmonary congestion, right worse than left.  CTA was ordered which was negative for PE but showed a moderate sized pleural effusion.  Her acute hypoxic respiratory failure was thought due to her HFpEF exacerbation.  Repeat echo cardiogram showed normal EF with no regional wall abnormality.  Patient was given IV Lasix for volume removal.  We offered thoracentesis for symptom relief however patient's daughter declined.  Patient's daughter states that her mother symptoms may be driven by anxiety.  Patient was getting IV Lasix 60 mg for 2 days and transition  back to her p.o. Lasix on day 3.  Patient's oxygen saturation stayed around 88-90% on room air. Her Lasix dosage was increased from 20 to 40 mg at discharge to help with diuresis and oxygen saturation.  2.  Right-sided breast cancer Patient has history of stage IA right breast cancer status postlumpectomy and radiation therapy in 2001.  She also had a diagnostic mammogram in January 2022 which show mildly dilated duct concerning for an intraductal mass.  No further work-up was pursued based on family preference given patient's age.  Breast exam on admission showed inflammatory changes of the right breast with circumferential induration, inversion of her nipple with spontaneous clear nipple discharge.  This findings were chronic based on oncology notes.  Right breast MRI was ordered to evaluation for possible recurrent cancer.  Patient however could not tolerate laying prone for 30 minutes.  Recommended outpatient breast MRI if family would like to pursue with more imaging.  3.  Vocal cord dysfunction / Laryngeal spasms During her first examination on admission, patient had audible upper airway wheezing, speaking in  short sentences and tachypnea.  Voice hoarseness also noted.  This episodes were resolved the next day and came back.  Her lungs sound clear during these episodes.  Symptom was suspicious for vocal cord dysfunction rather than pulmonary issue.  Speech therapist has evaluated the patient and did not notice any obvious signs of irregular vocal fold movement.  However the definitive diagnosis have to be made with a direct visualization.  We recommend follow-up with outpatient speech therapy.  Can also consider ENT follow-up if family wants to pursue with more intervention.  4.  Urinary tract infection His UA on admission showed hematuria, leukocyte and nitrite positive with bacteriuria.  Patient however denies dysuria or urinary frequency.  Given her acute presentation in the ED, she was treated  with 3 days of Bactrim.  Discharge Exam:   BP 123/69 (BP Location: Left Arm)   Pulse 89   Temp 97.8 F (36.6 C) (Oral)   Resp 19   Ht 5\' 5"  (1.651 m)   Wt 75.2 kg   SpO2 94%   BMI 27.59 kg/m  Discharge exam:  Physical Exam Constitutional:      Appearance: Normal appearance.  Cardiovascular:     Rate and Rhythm: Normal rate and regular rhythm.     Pulses: Normal pulses.     Heart sounds: Normal heart sounds.  Pulmonary:     Effort: Pulmonary effort is normal.     Breath sounds: Normal breath sounds.  Abdominal:     General: Bowel sounds are normal.     Palpations: Abdomen is soft.  Skin:    General: Skin is warm and dry.  Neurological:     Mental Status: She is alert.    Pertinent Labs, Studies, and Procedures:  CT Angio Chest PE W and/or Wo Contrast  Result Date: 08/16/2021 CLINICAL DATA:  Generalized weakness, trouble sleeping, atrial fibrillation, question pulmonary embolism, high clinical suspicion EXAM: CT ANGIOGRAPHY CHEST WITH CONTRAST TECHNIQUE: Multidetector CT imaging of the chest was performed using the standard protocol during bolus administration of intravenous contrast. Multiplanar CT image reconstructions and MIPs were obtained to evaluate the vascular anatomy. CONTRAST:  156mL OMNIPAQUE IOHEXOL 350 MG/ML SOLN IV COMPARISON:  02/16/2014 FINDINGS: Cardiovascular: Atherosclerotic calcifications of aorta and coronary arteries. Aorta normal caliber. Heart unremarkable. Trace pericardial effusion. Pulmonary arteries well opacified and patent. No evidence of pulmonary embolism. Mediastinum/Nodes: Base of cervical region normal appearance. Esophagus unremarkable. No thoracic adenopathy. Lungs/Pleura: Small to moderate RIGHT pleural effusion. Mild compressive atelectasis of posterior RIGHT lower lobe. Linear subsegmental atelectasis at RIGHT middle lobe. No pulmonary infiltrate or pneumothorax. Upper Abdomen: Cyst within anterior liver unchanged. Reflux of contrast into a  hepatic veins and IVC. Remaining visualized upper abdomen unremarkable. Musculoskeletal: Osseous demineralization. Mild degenerative disc disease changes of cervical and thoracic spine. Review of the MIP images confirms the above findings. IMPRESSION: No evidence of pulmonary embolism. Small to moderate RIGHT pleural effusion with compressive atelectasis of the posterior RIGHT lower lobe. Trace pericardial effusion. Aortic Atherosclerosis (ICD10-I70.0). Electronically Signed   By: Lavonia Dana M.D.   On: 08/16/2021 17:50   DG Chest Port 1 View  Result Date: 08/16/2021 CLINICAL DATA:  Shortness of breath EXAM: PORTABLE CHEST 1 VIEW COMPARISON:  07/13/2021 FINDINGS: Mild cardiomegaly with central vascular congestion. Hazy atelectasis at the right base. Possible small right effusion. No pneumothorax. Previously noted nodular density at the right base is not clearly identified on the current exam IMPRESSION: Mild cardiomegaly with central vascular congestion and hazy atelectasis at  the right base. Possible small right effusion. Electronically Signed   By: Donavan Foil M.D.   On: 08/16/2021 15:34   ECHOCARDIOGRAM COMPLETE  Result Date: 08/17/2021    ECHOCARDIOGRAM REPORT   Patient Name:   JENAVIE STANCZAK Date of Exam: 08/17/2021 Medical Rec #:  [id number]          Height:       65.0 in Accession #:    [id number]         Weight:       159.0 lb Date of Birth:  11/29/1923          BSA:          1.794 m Patient Age:    87 years           BP:           142/74 mmHg Patient Gender: F                  HR:           97 bpm. Exam Location:  Inpatient Procedure: 2D Echo, Color Doppler, Cardiac Doppler and Intracardiac            Opacification Agent Indications:    CHF  History:        Patient has prior history of Echocardiogram examinations. CAD                 and Previous Myocardial Infarction, Arrythmias:Atrial                 Fibrillation, Signs/Symptoms:Chest Pain; Risk                 Factors:Hypertension.   Sonographer:    Jyl Heinz Referring Phys: [id number] DAN FLOYD IMPRESSIONS  1. Left ventricular ejection fraction, by estimation, is 55 to 60%. The left ventricle has normal function. The left ventricle has no regional wall motion abnormalities. Left ventricular diastolic parameters are indeterminate.  2. Right ventricular systolic function is mildly reduced. The right ventricular size is normal.  3. Left atrial size was mildly dilated.  4. The mitral valve is normal in structure. Trivial mitral valve regurgitation. No evidence of mitral stenosis.  5. The aortic valve is normal in structure. Aortic valve regurgitation is trivial. Mild aortic valve sclerosis is present, with no evidence of aortic valve stenosis.  6. The inferior vena cava is normal in size with greater than 50% respiratory variability, suggesting right atrial pressure of 3 mmHg. FINDINGS  Left Ventricle: Left ventricular ejection fraction, by estimation, is 55 to 60%. The left ventricle has normal function. The left ventricle has no regional wall motion abnormalities. The left ventricular internal cavity size was normal in size. There is  no left ventricular hypertrophy. Left ventricular diastolic function could not be evaluated due to atrial fibrillation. Left ventricular diastolic parameters are indeterminate. Right Ventricle: The right ventricular size is normal. No increase in right ventricular wall thickness. Right ventricular systolic function is mildly reduced. Left Atrium: Left atrial size was mildly dilated. Right Atrium: Right atrial size was normal in size. Pericardium: There is no evidence of pericardial effusion. Mitral Valve: The mitral valve is normal in structure. Trivial mitral valve regurgitation. No evidence of mitral valve stenosis. Tricuspid Valve: The tricuspid valve is normal in structure. Tricuspid valve regurgitation is not demonstrated. No evidence of tricuspid stenosis. Aortic Valve: The aortic valve is normal in structure.  Aortic valve regurgitation is trivial. Aortic regurgitation PHT measures 489 msec. Mild aortic valve  sclerosis is present, with no evidence of aortic valve stenosis. Aortic valve peak gradient measures 5.2 mmHg. Pulmonic Valve: The pulmonic valve was normal in structure. Pulmonic valve regurgitation is not visualized. No evidence of pulmonic stenosis. Aorta: The aortic root is normal in size and structure. Venous: The inferior vena cava is normal in size with greater than 50% respiratory variability, suggesting right atrial pressure of 3 mmHg. IAS/Shunts: No atrial level shunt detected by color flow Doppler.  LEFT VENTRICLE PLAX 2D LVIDd:         4.40 cm     Diastology LVIDs:         3.60 cm     LV e' medial:    6.31 cm/s LV PW:         1.00 cm     LV E/e' medial:  11.4 LV IVS:        1.50 cm     LV e' lateral:   6.31 cm/s LVOT diam:     2.00 cm     LV E/e' lateral: 11.4 LV SV:         52 LV SV Index:   29 LVOT Area:     3.14 cm  LV Volumes (MOD) LV vol d, MOD A2C: 72.7 ml LV vol d, MOD A4C: 71.4 ml LV vol s, MOD A2C: 32.6 ml LV vol s, MOD A4C: 29.4 ml LV SV MOD A2C:     40.1 ml LV SV MOD A4C:     71.4 ml LV SV MOD BP:      43.9 ml RIGHT VENTRICLE            IVC RV Basal diam:  3.50 cm    IVC diam: 1.80 cm RV Mid diam:    2.80 cm RV S prime:     6.13 cm/s TAPSE (M-mode): 1.3 cm LEFT ATRIUM             Index        RIGHT ATRIUM           Index LA diam:        4.00 cm 2.23 cm/m   RA Area:     15.00 cm LA Vol (A2C):   54.6 ml 30.43 ml/m  RA Volume:   39.90 ml  22.24 ml/m LA Vol (A4C):   62.6 ml 34.89 ml/m LA Biplane Vol: 62.5 ml 34.83 ml/m  AORTIC VALVE AV Area (Vmax): 2.87 cm AV Vmax:        114.00 cm/s AV Peak Grad:   5.2 mmHg LVOT Vmax:      104.00 cm/s LVOT Vmean:     77.500 cm/s LVOT VTI:       0.165 m AI PHT:         489 msec  AORTA Ao Root diam: 2.90 cm Ao Asc diam:  2.90 cm MITRAL VALVE               TRICUSPID VALVE MV Area (PHT): 4.74 cm    TR Peak grad:   20.1 mmHg MV Decel Time: 160 msec    TR Vmax:         224.00 cm/s MV E velocity: 71.70 cm/s                            SHUNTS  Systemic VTI:  0.16 m                            Systemic Diam: 2.00 cm Kardie Tobb DO Electronically signed by Berniece Salines DO Signature Date/Time: 08/17/2021/4:47:19 PM    Final     CBC Latest Ref Rng & Units 08/21/2021 08/20/2021 08/19/2021  WBC 4.0 - 10.5 K/uL 8.3 10.9(H) 13.8(H)  Hemoglobin 12.0 - 15.0 g/dL 10.0(L) 10.5(L) 10.0(L)  Hematocrit 36.0 - 46.0 % 33.0(L) 33.3(L) 31.8(L)  Platelets 150 - 400 K/uL 250 241 234   CMP Latest Ref Rng & Units 08/21/2021 08/20/2021 08/19/2021  Glucose 70 - 99 mg/dL 88 90 78  BUN 8 - 23 mg/dL 26(H) 31(H) 36(H)  Creatinine 0.44 - 1.00 mg/dL 1.27(H) 1.35(H) 1.24(H)  Sodium 135 - 145 mmol/L 132(L) 134(L) 132(L)  Potassium 3.5 - 5.1 mmol/L 4.5 4.1 4.2  Chloride 98 - 111 mmol/L 99 100 97(L)  CO2 22 - 32 mmol/L 27 27 25   Calcium 8.9 - 10.3 mg/dL 8.6(L) 8.4(L) 7.9(L)  Total Protein 6.5 - 8.1 g/dL - - -  Total Bilirubin 0.3 - 1.2 mg/dL - - -  Alkaline Phos 38 - 126 U/L - - -  AST 15 - 41 U/L - - -  ALT 0 - 44 U/L - - -    Discharge Instructions: Discharge Instructions     Call MD for:  persistant nausea and vomiting   Complete by: As directed    Call MD for:  severe uncontrolled pain   Complete by: As directed    Call MD for:  temperature >100.4   Complete by: As directed    Diet - low sodium heart healthy   Complete by: As directed    Diet - low sodium heart healthy   Complete by: As directed    Discharge instructions   Complete by: As directed    Dear Ms. Anastos,  It was a pleasure taking care of you while you are in the hospital.  You were admitted to the hospital due to shortness of breath that required supplemental oxygen.  We found fluid buildup in your lungs on the right side which can contribute to your breathing issue. You have received IV Lasix for volume removal. We also increased your Lasix from 20 to 40 mg to help removing more  fluid.   In addition, we found that you had a urinary tract infection that was treated with 3 days of antibiotics.   We also think that you have paradoxical vocal cord movement which has led to you feeling short of breath. Please follow up with outpatient speech therapy and maybe an Ear-Nose-Throat doctor if you decide to pursue intervention.  You will be going to a rehab facility for physical therapy.  Take care,  Dr. Alfonse Spruce   Increase activity slowly   Complete by: As directed    Increase activity slowly   Complete by: As directed        Signed: Gaylan Gerold, DO 08/21/2021, 12:30 PM   Pager: [phone]

## 2021-08-21 NOTE — Progress Notes (Signed)
Pt discharging to SNF, [facility] in [place].  Copy of instructions printed and sent with transporters for facility.  Pt d/c'd with belongings, transported by [facility].  Attempted to call report, with no answer at the time of calling.

## 2021-08-21 NOTE — Progress Notes (Signed)
Physical Therapy Treatment Patient Details Name: Tonya Cruz MRN: [id number] DOB: 08-26-1924 Today's Date: 08/21/2021   History of Present Illness 87 year old female presents to [facility] ED 08/16/21 for shortness of breath, difficulty sleeping at night. In ED CTA negative for PE, moderate right pleural effusion with atelectasis posterior right lower lobe. CXR central vascular congestion and found to have asymptomatic UTI Admitted for treatment of Acute on chronic diastolic heart failure, moderate sized pleural effusion, PMHx of R sided breast cancer 2001 S/P lumpectomy and radiation, hypertension, hyperlipidemia, NSTEMI 2018, HFpEF, CAD with chronically occluded RCA with LAD DES, A. fib on apixaban, hypothyroidism, chronic anemia    PT Comments    Pt working with mobility tech to get dressed for discharge to [facility]. Pt declined ambulation however agreeable to seated exercise. Pt requiring increased cuing in breathing in between bouts of exercise. SaO2 > 90%O2 on 2L O2 via [place]. Pt will benefit from SNF level rehab to build strength and endurance.     Recommendations for follow up therapy are one component of a multi-disciplinary discharge planning process, led by the attending physician.  Recommendations may be updated based on patient status, additional functional criteria and insurance authorization.  Follow Up Recommendations  Skilled nursing-short term rehab (<3 hours/day)        Equipment Recommendations  None recommended by PT    Recommendations for Other Services OT consult     Precautions / Restrictions Precautions Precautions: Fall Restrictions Weight Bearing Restrictions: No     Mobility  Bed Mobility Overal bed mobility: Needs Assistance Bed Mobility: Sit to Supine       Sit to supine: Mod assist   General bed mobility comments: modA for returning LE back to bed    Transfers                   General transfer comment: sitting EoB after getting  ready for discharge.    Ambulation/Gait               General Gait Details: deferred          Balance Overall balance assessment: Needs assistance;History of Falls Sitting-balance support: Feet unsupported;Feet supported Sitting balance-Leahy Scale: Good                                      Cognition Arousal/Alertness: Awake/alert Behavior During Therapy: WFL for tasks assessed/performed Overall Cognitive Status: Within Functional Limits for tasks assessed                                          Exercises General Exercises - Lower Extremity Long Arc Quad: AROM;Both;5 reps;Seated (x2 sets) Hip ABduction/ADduction: AROM;Both;5 reps;Seated (2 sets) Hip Flexion/Marching: AROM;Both;Seated;5 reps (2 sets) Toe Raises: AROM;Both;5 reps;Seated (2 sets) Heel Raises: AROM;Both;5 reps;Seated (2 sets)    General Comments General comments (skin integrity, edema, etc.): Pt on 2L O2 via [place], SaO2 93%O2 however pt with 4/4 DoE with exercise requiring rest breaks and cuing for purse lip breathing      Pertinent Vitals/Pain Pain Assessment: No/denies pain     PT Goals (current goals can now be found in the care plan section) Acute Rehab PT Goals Patient Stated Goal: Pt wants to go home eventually PT Goal Formulation: With patient Time For Goal Achievement: 09/01/21 Potential to  Achieve Goals: Fair Progress towards PT goals: Progressing toward goals    Frequency    Min 2X/week      PT Plan         AM-PAC PT "6 Clicks" Mobility   Outcome Measure  Help needed turning from your back to your side while in a flat bed without using bedrails?: A Lot Help needed moving from lying on your back to sitting on the side of a flat bed without using bedrails?: A Lot Help needed moving to and from a bed to a chair (including a wheelchair)?: Total Help needed standing up from a chair using your arms (e.g., wheelchair or bedside chair)?: Total Help  needed to walk in hospital room?: Total Help needed climbing 3-5 steps with a railing? : Total 6 Click Score: 8    End of Session Equipment Utilized During Treatment: Oxygen Activity Tolerance: Patient tolerated treatment well Patient left: with call bell/phone within reach;in bed;with bed alarm set Nurse Communication: Mobility status;Other (comment) (O2 sat, urine collection) PT Visit Diagnosis: Unsteadiness on feet (R26.81);Repeated falls (R29.6);Muscle weakness (generalized) (M62.81);History of falling (Z91.81);Difficulty in walking, not elsewhere classified (R26.2);Adult, failure to thrive (R62.7)     Time: 9758-8325 PT Time Calculation (min) (ACUTE ONLY): 15 min  Charges:  $Therapeutic Exercise: 8-22 mins                     Tonya Cruz PT, DPT Acute Rehabilitation Services Pager [phone] Office [phone]    [place] 08/21/2021, 3:20 PM

## 2021-08-21 NOTE — Progress Notes (Addendum)
Mobility Specialist Progress Note:   08/21/21 1500  Mobility  Activity Ambulated in hall  Level of Assistance Minimal assist, patient does 75% or more  Assistive Device Front wheel walker  Distance Ambulated (ft) 80 ft  Mobility Ambulated with assistance in hallway  Mobility Response Tolerated well  Mobility performed by Mobility specialist  $Mobility charge 1 Mobility   Pre- Mobility: 77 HR During Mobility: 101 HR Post Mobility:  89 HR  Pt received in bed willing to participate in mobility. No complaints of pain. MinA to stand then contact guard throughout ambulation. Pt returned to EOB with call bell in reach, all needs met and PT present.   [facility] Health and safety inspector Phone [phone]

## 2021-08-21 NOTE — Progress Notes (Signed)
Occupational Therapy Treatment Patient Details Name: Tonya Cruz MRN: [id number] DOB: 07-28-1924 Today's Date: 08/21/2021   History of present illness 87 year old female presents to [facility] ED 08/16/21 for shortness of breath, difficulty sleeping at night. In ED CTA negative for PE, moderate right pleural effusion with atelectasis posterior right lower lobe. CXR central vascular congestion and found to have asymptomatic UTI Admitted for treatment of Acute on chronic diastolic heart failure, moderate sized pleural effusion, PMHx of R sided breast cancer 2001 S/P lumpectomy and radiation, hypertension, hyperlipidemia, NSTEMI 2018, HFpEF, CAD with chronically occluded RCA with LAD DES, A. fib on apixaban, hypothyroidism, chronic anemia   OT comments  Patient continues to make steady progress towards goals in skilled OT session. Patient's session encompassed  bed mobility, transfers, and minimal ambulation to complete household distances. Patient on room air during session, and while patient demonstrated DOE with movement, O2 level was stable or would recover with less than one minute when completing pursed lip breathing. Patient declining attempting to ambulate to bathroom during session, electing to use pure-wick instead. Patient left in recliner at end of session with all needs met. Therapy will continue to follow while in house.    Recommendations for follow up therapy are one component of a multi-disciplinary discharge planning process, led by the attending physician.  Recommendations may be updated based on patient status, additional functional criteria and insurance authorization.    Follow Up Recommendations  Home health OT    Assistance Recommended at Discharge Intermittent Supervision/Assistance  Equipment Recommendations  None recommended by OT    Recommendations for Other Services      Precautions / Restrictions Precautions Precautions: Fall Precaution Comments: Watch  O2 Restrictions Weight Bearing Restrictions: No       Mobility Bed Mobility Overal bed mobility: Needs Assistance Bed Mobility: Supine to Sit     Supine to sit: Supervision;HOB elevated     General bed mobility comments: Patient able to come to the EOB using bed rails, extra time needed due to DOE however VSS on RA    Transfers Overall transfer level: Needs assistance Equipment used: Rolling walker (2 wheels) Transfers: Sit to/from Stand;Bed to chair/wheelchair/BSC Sit to Stand: Min assist   Step pivot transfers: Min assist       General transfer comment: Min A to complete all transfers for safety and per patient request. Patient with improved strength and activity tolerance in session to date, and could have completed transfers at min guard level     Balance Overall balance assessment: Needs assistance Sitting-balance support: Feet supported Sitting balance-Leahy Scale: Good     Standing balance support: Bilateral upper extremity supported;Reliant on assistive device for balance Standing balance-Leahy Scale: Poor Standing balance comment: relaint on RW for balance, but able to march in place prior to transferring to chair                           ADL either performed or assessed with clinical judgement   ADL       Grooming: Wash/dry hands;Min guard;Sitting Grooming Details (indicate cue type and reason): sitting at the EOB                 Toilet Transfer: Min guard;Stand-pivot;Rolling walker (2 wheels) Toilet Transfer Details (indicate cue type and reason): simulated to the recliner           General ADL Comments: patient progressing well on RA with minimal ambulation to chair to sit  up    Extremity/Trunk Assessment              Vision       Perception     Praxis      Cognition Arousal/Alertness: Awake/alert Behavior During Therapy: WFL for tasks assessed/performed Overall Cognitive Status: Within Functional Limits for  tasks assessed                                            Exercises     Shoulder Instructions       General Comments      Pertinent Vitals/ Pain       Pain Assessment: No/denies pain  Home Living     Available Help at Discharge: Personal care attendant;Family;Available PRN/intermittently Type of Home: House                              Lives With: Alone    Prior Functioning/Environment              Frequency  Min 2X/week        Progress Toward Goals  OT Goals(current goals can now be found in the care plan section)  Progress towards OT goals: Progressing toward goals  Acute Rehab OT Goals Patient Stated Goal: wanting to go to [employer] in [place] to get a little stronger OT Goal Formulation: With patient Time For Goal Achievement: 09/02/21 Potential to Achieve Goals: Good  Plan Discharge plan remains appropriate    Co-evaluation                 AM-PAC OT "6 Clicks" Daily Activity     Outcome Measure   Help from another person eating meals?: None Help from another person taking care of personal grooming?: A Little Help from another person toileting, which includes using toliet, bedpan, or urinal?: A Little Help from another person bathing (including washing, rinsing, drying)?: A Little Help from another person to put on and taking off regular upper body clothing?: None Help from another person to put on and taking off regular lower body clothing?: A Little 6 Click Score: 20    End of Session Equipment Utilized During Treatment: Gait belt;Rolling walker (2 wheels)  OT Visit Diagnosis: Unsteadiness on feet (R26.81);Muscle weakness (generalized) (M62.81)   Activity Tolerance Patient tolerated treatment well   Patient Left in chair;with call bell/phone within reach;with chair alarm set   Nurse Communication Mobility status        Time: 1007-1030 OT Time Calculation (min): 23 min  Charges: OT General  Charges $OT Visit: 1 Visit OT Treatments $Self Care/Home Management : 23-37 mins  [place]. Young, COTA/L Acute Rehabilitation Services [place] 08/21/2021, 10:48 AM

## 2021-08-21 NOTE — Care Management Important Message (Signed)
Important Message  Patient Details  Name: Tonya Cruz MRN: [id number] Date of Birth: Jan 23, 1924   Medicare Important Message Given:  Yes     Shelda Altes 08/21/2021, 10:32 AM

## 2021-08-21 NOTE — Evaluation (Signed)
Speech Language Pathology Evaluation Patient Details Name: Tonya Cruz MRN: [id number] DOB: 14-Jun-1924 Today's Date: 08/21/2021 Time: 0962-8366 SLP Time Calculation (min) (ACUTE ONLY): 21 min  Problem List:  Patient Active Problem List   Diagnosis Date Noted   Acute on chronic diastolic (congestive) heart failure ([place]) 08/16/2021   AKI (acute kidney injury) ([place]) 08/16/2021   Pleural effusion on right 08/16/2021   Atrial fibrillation ([place]) 08/16/2021   Breast cancer of upper-outer quadrant of right female breast ([place]) 10/26/2020    Class: Diagnosis of   History of breast cancer 10/19/2020    Class: Diagnosis of   Lymphedema of right upper extremity 10/14/2020    Class: Chronic   Extensor tendonitis of foot 01/19/2020   Hypertrophic and atrophic condition of skin 01/19/2020   Chest pain 07/06/2017   Acute hyponatremia 07/06/2017   GERD (gastroesophageal reflux disease) 07/06/2017   Hypothyroidism 07/06/2017   Hyperlipemia 06/12/2017   CAD (coronary artery disease), native coronary artery 06/12/2017   Hypertension 06/08/2017   Old MI (myocardial infarction) 06/08/2017   Past Medical History:  Past Medical History:  Diagnosis Date   Allergy    Anemia    pernicious   Anxiety    Arthritis    Breast cancer ([place])    right side   Coronary artery disease    06/11/17 PTCA/DES x1 to mLAD, CTO mRCA with col.    Diastolic heart failure (HCC)    GERD (gastroesophageal reflux disease)    High cholesterol    History of myocardial infarction    Hypertension    NSTEMI (non-ST elevated myocardial infarction) ([place]) 06/11/2017   Osteoporosis    Paroxysmal atrial fibrillation (HCC)    Post herpetic neuralgia    Thyroid disease    Past Surgical History:  Past Surgical History:  Procedure Laterality Date   BREAST SURGERY     CARDIAC CATHETERIZATION  06/11/2017   CORONARY STENT INTERVENTION N/A 06/11/2017   Procedure: CORONARY STENT INTERVENTION;  Surgeon: Burnell Blanks, MD;  Location: [facility];  Service: Cardiovascular;  Laterality: N/A;   LEFT HEART CATH AND CORONARY ANGIOGRAPHY N/A 06/11/2017   Procedure: LEFT HEART CATH AND CORONARY ANGIOGRAPHY;  Surgeon: Burnell Blanks, MD;  Location: [facility];  Service: Cardiovascular;  Laterality: N/A;   roto cuff     TONSILLECTOMY     HPI:  87 year old female, lives alone with family support, presents to [facility] ED 08/16/21 for shortness of breath, difficulty sleeping at night. In ED CTA negative for PE, moderate right pleural effusion with atelectasis posterior right lower lobe. CXR central vascular congestion and found to have asymptomatic UTI Admitted for treatment of Acute on chronic diastolic heart failure, moderate sized pleural effusion, PMHx of R sided breast cancer 2001 S/P lumpectomy and radiation, hypertension, hyperlipidemia, NSTEMI 2018, HFpEF, CAD with chronically occluded RCA with LAD DES, A. fib on apixaban, hypothyroidism, chronic anemia   Assessment / Plan / Recommendation Clinical Impression  Consulted to evaluate for paradoxical vocal fold movement.  Basic clinical assessment completed. For definitive diagnosis, pt would need evaluation by ENT/SLP as an OP to evaluate via instrumentation (e.g., stroboscopy).  During our session today, she presented with breaks in phonation consistently at the end of her utterances.  Voice was mildly hoarse; not breathy. Pitch normal.  No strangled or strained quality. Sustained phonation was an average of 5-7 seconds (typical range 15-25 seconds for females, uncertain if this holds for advanced age). S/z ratio was 1, suggesting functional  phonation during that isolated task.  There were no obvious signs of irregular or prolonged vocal fold adduction. Her primary symptom, consistent aphonia at the ends of her utterances, is more likely to suggest lower lung volumes and/or decreased modulation of expiration during speech. No further recommendations for  this admission. If diagnosis is a priority for Tonya Cruz, she could see see ENT as OP.  Otherwise, our service will sign off.    SLP Assessment  SLP Recommendation/Assessment: Patient does not need any further Speech [facility] SLP Visit Diagnosis: Aphonia (R49.1)    Recommendations for follow up therapy are one component of a multi-disciplinary discharge planning process, led by the attending physician.  Recommendations may be updated based on patient status, additional functional criteria and insurance authorization.    Follow Up Recommendations  Outpatient SLP (voice specialty) and/or ENT (if pt wants to pursue)    Assistance Recommended at Discharge  Other (comment) (per medical team recommendations)                 SLP Evaluation             See Clinical impressions        Oral / Motor  Oral Motor/Sensory Function Overall Oral Motor/Sensory Function: Within functional limits   GO                   Dail Meece L. Tivis Ringer, [place] CCC/SLP Acute Rehabilitation Services Office number [phone] Pager [phone]  Juan Quam Laurice 08/21/2021, 9:35 AM

## 2021-08-23 DIAGNOSIS — D649 Anemia, unspecified: Secondary | ICD-10-CM | POA: Diagnosis not present

## 2021-08-23 DIAGNOSIS — N1831 Chronic kidney disease, stage 3a: Secondary | ICD-10-CM | POA: Diagnosis not present

## 2021-08-23 DIAGNOSIS — R262 Difficulty in walking, not elsewhere classified: Secondary | ICD-10-CM | POA: Diagnosis not present

## 2021-08-23 DIAGNOSIS — I5032 Chronic diastolic (congestive) heart failure: Secondary | ICD-10-CM | POA: Diagnosis not present

## 2021-09-04 DIAGNOSIS — K219 Gastro-esophageal reflux disease without esophagitis: Secondary | ICD-10-CM | POA: Diagnosis not present

## 2021-09-04 DIAGNOSIS — R5381 Other malaise: Secondary | ICD-10-CM | POA: Diagnosis not present

## 2021-09-04 DIAGNOSIS — I5032 Chronic diastolic (congestive) heart failure: Secondary | ICD-10-CM | POA: Diagnosis not present

## 2021-09-04 DIAGNOSIS — I4891 Unspecified atrial fibrillation: Secondary | ICD-10-CM | POA: Diagnosis not present

## 2021-09-07 DIAGNOSIS — G609 Hereditary and idiopathic neuropathy, unspecified: Secondary | ICD-10-CM | POA: Diagnosis not present

## 2021-09-07 DIAGNOSIS — J383 Other diseases of vocal cords: Secondary | ICD-10-CM | POA: Diagnosis not present

## 2021-09-07 DIAGNOSIS — M6281 Muscle weakness (generalized): Secondary | ICD-10-CM | POA: Diagnosis not present

## 2021-09-07 DIAGNOSIS — F419 Anxiety disorder, unspecified: Secondary | ICD-10-CM | POA: Diagnosis not present

## 2021-09-07 DIAGNOSIS — J918 Pleural effusion in other conditions classified elsewhere: Secondary | ICD-10-CM | POA: Diagnosis not present

## 2021-09-07 DIAGNOSIS — M2550 Pain in unspecified joint: Secondary | ICD-10-CM | POA: Diagnosis not present

## 2021-09-07 DIAGNOSIS — F339 Major depressive disorder, recurrent, unspecified: Secondary | ICD-10-CM | POA: Diagnosis not present

## 2021-09-07 DIAGNOSIS — E039 Hypothyroidism, unspecified: Secondary | ICD-10-CM | POA: Diagnosis not present

## 2021-09-07 DIAGNOSIS — E785 Hyperlipidemia, unspecified: Secondary | ICD-10-CM | POA: Diagnosis not present

## 2021-09-07 DIAGNOSIS — I480 Paroxysmal atrial fibrillation: Secondary | ICD-10-CM | POA: Diagnosis not present

## 2021-09-07 DIAGNOSIS — R2689 Other abnormalities of gait and mobility: Secondary | ICD-10-CM | POA: Diagnosis not present

## 2021-09-07 DIAGNOSIS — I10 Essential (primary) hypertension: Secondary | ICD-10-CM | POA: Diagnosis not present

## 2021-09-07 DIAGNOSIS — K219 Gastro-esophageal reflux disease without esophagitis: Secondary | ICD-10-CM | POA: Diagnosis not present

## 2021-09-07 DIAGNOSIS — I5033 Acute on chronic diastolic (congestive) heart failure: Secondary | ICD-10-CM | POA: Diagnosis not present

## 2021-09-07 DIAGNOSIS — M810 Age-related osteoporosis without current pathological fracture: Secondary | ICD-10-CM | POA: Diagnosis not present

## 2021-09-13 ENCOUNTER — Other Ambulatory Visit: Payer: Self-pay | Admitting: *Deleted

## 2021-09-13 NOTE — Patient Outreach (Signed)
Member screened for potential [facility] Care Management needs. Mrs. Setterlund resides in [facility].  Update received from [place], [place] indicating transitioned to LTC temporarily until the end of the year.   Will plan outreach to family if member returns home at later time.    Marthenia Rolling, MSN, RN,BSN [place] Acute Care Coordinator [phone] [facility]) [phone]  (Toll free office)

## 2021-09-14 DIAGNOSIS — F32A Depression, unspecified: Secondary | ICD-10-CM | POA: Diagnosis not present

## 2021-09-14 DIAGNOSIS — F411 Generalized anxiety disorder: Secondary | ICD-10-CM | POA: Diagnosis not present

## 2021-09-24 ENCOUNTER — Other Ambulatory Visit: Payer: Self-pay | Admitting: Interventional Cardiology

## 2021-10-04 DIAGNOSIS — M810 Age-related osteoporosis without current pathological fracture: Secondary | ICD-10-CM | POA: Diagnosis not present

## 2021-10-04 DIAGNOSIS — I119 Hypertensive heart disease without heart failure: Secondary | ICD-10-CM | POA: Diagnosis not present

## 2021-10-04 DIAGNOSIS — E039 Hypothyroidism, unspecified: Secondary | ICD-10-CM | POA: Diagnosis not present

## 2021-10-04 DIAGNOSIS — E785 Hyperlipidemia, unspecified: Secondary | ICD-10-CM | POA: Diagnosis not present

## 2021-10-05 DIAGNOSIS — F32A Depression, unspecified: Secondary | ICD-10-CM | POA: Diagnosis not present

## 2021-10-05 DIAGNOSIS — F411 Generalized anxiety disorder: Secondary | ICD-10-CM | POA: Diagnosis not present

## 2021-10-09 DIAGNOSIS — I10 Essential (primary) hypertension: Secondary | ICD-10-CM | POA: Diagnosis not present

## 2021-10-09 DIAGNOSIS — I5033 Acute on chronic diastolic (congestive) heart failure: Secondary | ICD-10-CM | POA: Diagnosis not present

## 2021-10-09 DIAGNOSIS — I480 Paroxysmal atrial fibrillation: Secondary | ICD-10-CM | POA: Diagnosis not present

## 2021-10-09 DIAGNOSIS — J918 Pleural effusion in other conditions classified elsewhere: Secondary | ICD-10-CM | POA: Diagnosis not present

## 2021-10-25 ENCOUNTER — Ambulatory Visit: Payer: Medicare Other | Admitting: Interventional Cardiology

## 2021-10-25 DIAGNOSIS — R2689 Other abnormalities of gait and mobility: Secondary | ICD-10-CM | POA: Diagnosis not present

## 2021-10-26 DIAGNOSIS — I5033 Acute on chronic diastolic (congestive) heart failure: Secondary | ICD-10-CM | POA: Diagnosis not present

## 2021-10-26 DIAGNOSIS — K219 Gastro-esophageal reflux disease without esophagitis: Secondary | ICD-10-CM | POA: Diagnosis not present

## 2021-10-26 DIAGNOSIS — F339 Major depressive disorder, recurrent, unspecified: Secondary | ICD-10-CM | POA: Diagnosis not present

## 2021-10-26 DIAGNOSIS — M810 Age-related osteoporosis without current pathological fracture: Secondary | ICD-10-CM | POA: Diagnosis not present

## 2021-10-26 DIAGNOSIS — F419 Anxiety disorder, unspecified: Secondary | ICD-10-CM | POA: Diagnosis not present

## 2021-10-26 DIAGNOSIS — E785 Hyperlipidemia, unspecified: Secondary | ICD-10-CM | POA: Diagnosis not present

## 2021-10-26 DIAGNOSIS — I480 Paroxysmal atrial fibrillation: Secondary | ICD-10-CM | POA: Diagnosis not present

## 2021-10-26 DIAGNOSIS — R54 Age-related physical debility: Secondary | ICD-10-CM | POA: Diagnosis not present

## 2021-10-26 DIAGNOSIS — I110 Hypertensive heart disease with heart failure: Secondary | ICD-10-CM | POA: Diagnosis not present

## 2021-10-26 DIAGNOSIS — M542 Cervicalgia: Secondary | ICD-10-CM | POA: Diagnosis not present

## 2021-10-31 DIAGNOSIS — R2689 Other abnormalities of gait and mobility: Secondary | ICD-10-CM | POA: Diagnosis not present

## 2021-11-03 DIAGNOSIS — R2689 Other abnormalities of gait and mobility: Secondary | ICD-10-CM | POA: Diagnosis not present

## 2021-11-05 DIAGNOSIS — D649 Anemia, unspecified: Secondary | ICD-10-CM | POA: Diagnosis not present

## 2021-11-05 DIAGNOSIS — F339 Major depressive disorder, recurrent, unspecified: Secondary | ICD-10-CM | POA: Diagnosis not present

## 2021-11-05 DIAGNOSIS — I5032 Chronic diastolic (congestive) heart failure: Secondary | ICD-10-CM | POA: Diagnosis not present

## 2021-11-05 DIAGNOSIS — I4891 Unspecified atrial fibrillation: Secondary | ICD-10-CM | POA: Diagnosis not present

## 2021-11-06 ENCOUNTER — Encounter: Payer: Self-pay | Admitting: Podiatry

## 2021-11-06 ENCOUNTER — Ambulatory Visit (INDEPENDENT_AMBULATORY_CARE_PROVIDER_SITE_OTHER): Payer: Medicare Other | Admitting: Podiatry

## 2021-11-06 DIAGNOSIS — M6281 Muscle weakness (generalized): Secondary | ICD-10-CM | POA: Insufficient documentation

## 2021-11-06 DIAGNOSIS — F419 Anxiety disorder, unspecified: Secondary | ICD-10-CM | POA: Insufficient documentation

## 2021-11-06 DIAGNOSIS — J383 Other diseases of vocal cords: Secondary | ICD-10-CM | POA: Insufficient documentation

## 2021-11-06 DIAGNOSIS — M810 Age-related osteoporosis without current pathological fracture: Secondary | ICD-10-CM | POA: Insufficient documentation

## 2021-11-06 DIAGNOSIS — D689 Coagulation defect, unspecified: Secondary | ICD-10-CM

## 2021-11-06 DIAGNOSIS — B351 Tinea unguium: Secondary | ICD-10-CM | POA: Diagnosis not present

## 2021-11-06 DIAGNOSIS — D649 Anemia, unspecified: Secondary | ICD-10-CM | POA: Diagnosis not present

## 2021-11-06 DIAGNOSIS — Z8616 Personal history of COVID-19: Secondary | ICD-10-CM | POA: Insufficient documentation

## 2021-11-06 DIAGNOSIS — R269 Unspecified abnormalities of gait and mobility: Secondary | ICD-10-CM | POA: Insufficient documentation

## 2021-11-06 DIAGNOSIS — Z79899 Other long term (current) drug therapy: Secondary | ICD-10-CM | POA: Diagnosis not present

## 2021-11-06 DIAGNOSIS — G609 Hereditary and idiopathic neuropathy, unspecified: Secondary | ICD-10-CM | POA: Insufficient documentation

## 2021-11-06 DIAGNOSIS — F339 Major depressive disorder, recurrent, unspecified: Secondary | ICD-10-CM | POA: Insufficient documentation

## 2021-11-06 DIAGNOSIS — I509 Heart failure, unspecified: Secondary | ICD-10-CM | POA: Diagnosis not present

## 2021-11-06 NOTE — Progress Notes (Signed)
°  Subjective:  Patient ID: Tonya Cruz, female    DOB: 1923-12-21,  MRN: [id number]  Chief Complaint  Patient presents with   Nail Problem    Trim nails    88 y.o. female presents with the above complaint.  Denies new issues. Still on oral AC Objective:  Physical Exam: warm, good capillary refill, nail exam onychomycosis of the toenails, no trophic changes or ulcerative lesions, normal DP and PT pulses, and normal sensory exam. Left Foot: normal exam, no swelling, tenderness, instability; ligaments intact, full range of motion of all ankle/foot joints  Right Foot: normal exam, no swelling, tenderness, instability; ligaments intact, full range of motion of all ankle/foot joints   No images are attached to the encounter.  Assessment:   1. Onychomycosis   2. Coagulation defect [facility])     Plan:  Patient was evaluated and treated and all questions answered.  Onychomycosis and Coagulation Defect  -Nails debrided as below  Procedure: Nail Debridement Type of Debridement: manual, sharp debridement. Instrumentation: Nail nipper, rotary burr. Number of Nails: 10   Return in about 3 months (around 02/04/2022) for Routine Foot Care - on Anticoagulant.

## 2021-11-07 DIAGNOSIS — R2689 Other abnormalities of gait and mobility: Secondary | ICD-10-CM | POA: Diagnosis not present

## 2021-11-09 DIAGNOSIS — F32A Depression, unspecified: Secondary | ICD-10-CM | POA: Diagnosis not present

## 2021-11-09 DIAGNOSIS — F411 Generalized anxiety disorder: Secondary | ICD-10-CM | POA: Diagnosis not present

## 2021-11-09 DIAGNOSIS — R2689 Other abnormalities of gait and mobility: Secondary | ICD-10-CM | POA: Diagnosis not present

## 2021-11-10 DIAGNOSIS — R2689 Other abnormalities of gait and mobility: Secondary | ICD-10-CM | POA: Diagnosis not present

## 2021-11-14 ENCOUNTER — Ambulatory Visit: Payer: Medicare Other | Admitting: Hematology and Oncology

## 2021-11-14 DIAGNOSIS — R2689 Other abnormalities of gait and mobility: Secondary | ICD-10-CM | POA: Diagnosis not present

## 2021-11-15 DIAGNOSIS — R2689 Other abnormalities of gait and mobility: Secondary | ICD-10-CM | POA: Diagnosis not present

## 2021-11-16 DIAGNOSIS — E039 Hypothyroidism, unspecified: Secondary | ICD-10-CM | POA: Diagnosis not present

## 2021-11-16 DIAGNOSIS — R2689 Other abnormalities of gait and mobility: Secondary | ICD-10-CM | POA: Diagnosis not present

## 2021-11-21 DIAGNOSIS — R2689 Other abnormalities of gait and mobility: Secondary | ICD-10-CM | POA: Diagnosis not present

## 2021-11-22 ENCOUNTER — Ambulatory Visit: Payer: Medicare Other | Admitting: Interventional Cardiology

## 2021-12-04 DIAGNOSIS — I119 Hypertensive heart disease without heart failure: Secondary | ICD-10-CM | POA: Diagnosis not present

## 2021-12-04 DIAGNOSIS — R5381 Other malaise: Secondary | ICD-10-CM | POA: Diagnosis not present

## 2021-12-04 DIAGNOSIS — E785 Hyperlipidemia, unspecified: Secondary | ICD-10-CM | POA: Diagnosis not present

## 2021-12-04 DIAGNOSIS — E039 Hypothyroidism, unspecified: Secondary | ICD-10-CM | POA: Diagnosis not present

## 2021-12-05 DIAGNOSIS — R0981 Nasal congestion: Secondary | ICD-10-CM | POA: Diagnosis not present

## 2021-12-05 DIAGNOSIS — I5033 Acute on chronic diastolic (congestive) heart failure: Secondary | ICD-10-CM | POA: Diagnosis not present

## 2021-12-06 DIAGNOSIS — I5033 Acute on chronic diastolic (congestive) heart failure: Secondary | ICD-10-CM | POA: Diagnosis not present

## 2021-12-06 DIAGNOSIS — I10 Essential (primary) hypertension: Secondary | ICD-10-CM | POA: Diagnosis not present

## 2021-12-06 DIAGNOSIS — E785 Hyperlipidemia, unspecified: Secondary | ICD-10-CM | POA: Diagnosis not present

## 2022-01-03 DIAGNOSIS — M810 Age-related osteoporosis without current pathological fracture: Secondary | ICD-10-CM | POA: Diagnosis not present

## 2022-01-03 DIAGNOSIS — I4891 Unspecified atrial fibrillation: Secondary | ICD-10-CM | POA: Diagnosis not present

## 2022-01-03 DIAGNOSIS — F339 Major depressive disorder, recurrent, unspecified: Secondary | ICD-10-CM | POA: Diagnosis not present

## 2022-01-03 DIAGNOSIS — D649 Anemia, unspecified: Secondary | ICD-10-CM | POA: Diagnosis not present

## 2022-01-10 DIAGNOSIS — I5020 Unspecified systolic (congestive) heart failure: Secondary | ICD-10-CM | POA: Diagnosis not present

## 2022-01-15 ENCOUNTER — Encounter: Payer: Self-pay | Admitting: Podiatry

## 2022-01-15 ENCOUNTER — Ambulatory Visit (INDEPENDENT_AMBULATORY_CARE_PROVIDER_SITE_OTHER): Payer: Medicare Other | Admitting: Podiatry

## 2022-01-15 DIAGNOSIS — B351 Tinea unguium: Secondary | ICD-10-CM | POA: Diagnosis not present

## 2022-01-15 DIAGNOSIS — D689 Coagulation defect, unspecified: Secondary | ICD-10-CM

## 2022-01-15 DIAGNOSIS — M542 Cervicalgia: Secondary | ICD-10-CM | POA: Insufficient documentation

## 2022-01-15 DIAGNOSIS — R54 Age-related physical debility: Secondary | ICD-10-CM | POA: Insufficient documentation

## 2022-01-15 NOTE — Progress Notes (Signed)
?  Subjective:  ?Patient ID: Tonya Cruz, female    DOB: July 13, 1924,  MRN: [id number] ? ?Chief Complaint  ?Patient presents with  ? Nail Problem  ?  Trim nails   ? ?88 y.o. female presents with the above complaint.  Denies new issues. Still on oral anti-coagulants.  ? ?Objective:  ?Physical Exam: ?warm, good capillary refill, nail exam onychomycosis of the toenails, no trophic changes or ulcerative lesions, normal DP and PT pulses, and normal sensory exam. ?Left Foot: normal exam, no swelling, tenderness, instability; ligaments intact, full range of motion of all ankle/foot joints  ?Right Foot: normal exam, no swelling, tenderness, instability; ligaments intact, full range of motion of all ankle/foot joints  ? ?No images are attached to the encounter. ? ?Assessment:  ? ?1. Onychomycosis   ?2. Coagulation defect ([place])   ? ? ?Plan:  ?Patient was evaluated and treated and all questions answered. ? ?Onychomycosis and Coagulation Defect  ?-Nails debrided as below ? ?Procedure: Nail Debridement ?Type of Debridement: manual, sharp debridement. ?Instrumentation: Nail nipper, rotary burr. ?Number of Nails: 10 ?  ?Return in about 3 months (around 04/16/2022) for rfc.  ?

## 2022-01-18 DIAGNOSIS — M810 Age-related osteoporosis without current pathological fracture: Secondary | ICD-10-CM | POA: Diagnosis not present

## 2022-01-18 DIAGNOSIS — Z6831 Body mass index (BMI) 31.0-31.9, adult: Secondary | ICD-10-CM | POA: Diagnosis not present

## 2022-01-30 DIAGNOSIS — K219 Gastro-esophageal reflux disease without esophagitis: Secondary | ICD-10-CM | POA: Diagnosis not present

## 2022-02-03 DIAGNOSIS — R5381 Other malaise: Secondary | ICD-10-CM | POA: Diagnosis not present

## 2022-02-03 DIAGNOSIS — N1831 Chronic kidney disease, stage 3a: Secondary | ICD-10-CM | POA: Diagnosis not present

## 2022-02-03 DIAGNOSIS — I5032 Chronic diastolic (congestive) heart failure: Secondary | ICD-10-CM | POA: Diagnosis not present

## 2022-02-03 DIAGNOSIS — I4891 Unspecified atrial fibrillation: Secondary | ICD-10-CM | POA: Diagnosis not present

## 2022-02-05 DIAGNOSIS — M810 Age-related osteoporosis without current pathological fracture: Secondary | ICD-10-CM | POA: Diagnosis not present

## 2022-02-17 DIAGNOSIS — I5033 Acute on chronic diastolic (congestive) heart failure: Secondary | ICD-10-CM | POA: Diagnosis not present

## 2022-02-17 DIAGNOSIS — I10 Essential (primary) hypertension: Secondary | ICD-10-CM | POA: Diagnosis not present

## 2022-02-19 ENCOUNTER — Encounter: Payer: Self-pay | Admitting: Cardiology

## 2022-02-19 DIAGNOSIS — R531 Weakness: Secondary | ICD-10-CM | POA: Diagnosis not present

## 2022-02-19 DIAGNOSIS — I509 Heart failure, unspecified: Secondary | ICD-10-CM | POA: Diagnosis not present

## 2022-02-19 DIAGNOSIS — I252 Old myocardial infarction: Secondary | ICD-10-CM | POA: Diagnosis not present

## 2022-02-19 DIAGNOSIS — Z7901 Long term (current) use of anticoagulants: Secondary | ICD-10-CM | POA: Diagnosis not present

## 2022-02-19 DIAGNOSIS — J90 Pleural effusion, not elsewhere classified: Secondary | ICD-10-CM | POA: Diagnosis not present

## 2022-02-19 DIAGNOSIS — I491 Atrial premature depolarization: Secondary | ICD-10-CM | POA: Diagnosis not present

## 2022-02-19 DIAGNOSIS — I110 Hypertensive heart disease with heart failure: Secondary | ICD-10-CM | POA: Diagnosis not present

## 2022-02-19 DIAGNOSIS — J9811 Atelectasis: Secondary | ICD-10-CM | POA: Diagnosis not present

## 2022-02-19 DIAGNOSIS — F32A Depression, unspecified: Secondary | ICD-10-CM | POA: Diagnosis present

## 2022-02-19 DIAGNOSIS — F419 Anxiety disorder, unspecified: Secondary | ICD-10-CM | POA: Diagnosis present

## 2022-02-19 DIAGNOSIS — I2510 Atherosclerotic heart disease of native coronary artery without angina pectoris: Secondary | ICD-10-CM | POA: Diagnosis not present

## 2022-02-19 DIAGNOSIS — R079 Chest pain, unspecified: Secondary | ICD-10-CM | POA: Diagnosis not present

## 2022-02-19 DIAGNOSIS — R0902 Hypoxemia: Secondary | ICD-10-CM | POA: Diagnosis not present

## 2022-02-19 DIAGNOSIS — I480 Paroxysmal atrial fibrillation: Secondary | ICD-10-CM | POA: Diagnosis not present

## 2022-02-19 DIAGNOSIS — K219 Gastro-esophageal reflux disease without esophagitis: Secondary | ICD-10-CM | POA: Diagnosis present

## 2022-02-19 DIAGNOSIS — I340 Nonrheumatic mitral (valve) insufficiency: Secondary | ICD-10-CM | POA: Diagnosis not present

## 2022-02-19 DIAGNOSIS — M1990 Unspecified osteoarthritis, unspecified site: Secondary | ICD-10-CM | POA: Diagnosis not present

## 2022-02-19 DIAGNOSIS — B0229 Other postherpetic nervous system involvement: Secondary | ICD-10-CM | POA: Diagnosis not present

## 2022-02-19 DIAGNOSIS — E039 Hypothyroidism, unspecified: Secondary | ICD-10-CM | POA: Diagnosis present

## 2022-02-19 DIAGNOSIS — E785 Hyperlipidemia, unspecified: Secondary | ICD-10-CM | POA: Diagnosis not present

## 2022-02-19 DIAGNOSIS — I10 Essential (primary) hypertension: Secondary | ICD-10-CM | POA: Diagnosis not present

## 2022-02-19 DIAGNOSIS — Z880 Allergy status to penicillin: Secondary | ICD-10-CM | POA: Diagnosis not present

## 2022-02-19 DIAGNOSIS — R279 Unspecified lack of coordination: Secondary | ICD-10-CM | POA: Diagnosis not present

## 2022-02-19 DIAGNOSIS — J45909 Unspecified asthma, uncomplicated: Secondary | ICD-10-CM | POA: Diagnosis not present

## 2022-02-19 DIAGNOSIS — Z853 Personal history of malignant neoplasm of breast: Secondary | ICD-10-CM | POA: Diagnosis not present

## 2022-02-19 DIAGNOSIS — J9690 Respiratory failure, unspecified, unspecified whether with hypoxia or hypercapnia: Secondary | ICD-10-CM | POA: Diagnosis not present

## 2022-02-19 DIAGNOSIS — I361 Nonrheumatic tricuspid (valve) insufficiency: Secondary | ICD-10-CM | POA: Diagnosis not present

## 2022-02-19 DIAGNOSIS — R0789 Other chest pain: Secondary | ICD-10-CM | POA: Diagnosis not present

## 2022-02-19 DIAGNOSIS — Z87440 Personal history of urinary (tract) infections: Secondary | ICD-10-CM | POA: Diagnosis not present

## 2022-02-19 DIAGNOSIS — R0602 Shortness of breath: Secondary | ICD-10-CM | POA: Diagnosis not present

## 2022-02-19 DIAGNOSIS — Z79899 Other long term (current) drug therapy: Secondary | ICD-10-CM | POA: Diagnosis not present

## 2022-02-19 DIAGNOSIS — Z881 Allergy status to other antibiotic agents status: Secondary | ICD-10-CM | POA: Diagnosis not present

## 2022-02-22 DIAGNOSIS — I4891 Unspecified atrial fibrillation: Secondary | ICD-10-CM | POA: Diagnosis not present

## 2022-02-22 DIAGNOSIS — I5033 Acute on chronic diastolic (congestive) heart failure: Secondary | ICD-10-CM | POA: Diagnosis not present

## 2022-02-22 DIAGNOSIS — J9621 Acute and chronic respiratory failure with hypoxia: Secondary | ICD-10-CM | POA: Diagnosis not present

## 2022-02-22 DIAGNOSIS — I2510 Atherosclerotic heart disease of native coronary artery without angina pectoris: Secondary | ICD-10-CM | POA: Diagnosis not present

## 2022-03-07 DIAGNOSIS — R1310 Dysphagia, unspecified: Secondary | ICD-10-CM | POA: Diagnosis not present

## 2022-03-07 DIAGNOSIS — K5900 Constipation, unspecified: Secondary | ICD-10-CM | POA: Diagnosis not present

## 2022-03-15 DIAGNOSIS — I480 Paroxysmal atrial fibrillation: Secondary | ICD-10-CM | POA: Diagnosis not present

## 2022-03-15 DIAGNOSIS — M6281 Muscle weakness (generalized): Secondary | ICD-10-CM | POA: Diagnosis not present

## 2022-03-15 DIAGNOSIS — R2689 Other abnormalities of gait and mobility: Secondary | ICD-10-CM | POA: Diagnosis not present

## 2022-03-15 DIAGNOSIS — J918 Pleural effusion in other conditions classified elsewhere: Secondary | ICD-10-CM | POA: Diagnosis not present

## 2022-03-16 DIAGNOSIS — R2689 Other abnormalities of gait and mobility: Secondary | ICD-10-CM | POA: Diagnosis not present

## 2022-03-16 DIAGNOSIS — M6281 Muscle weakness (generalized): Secondary | ICD-10-CM | POA: Diagnosis not present

## 2022-03-16 DIAGNOSIS — I480 Paroxysmal atrial fibrillation: Secondary | ICD-10-CM | POA: Diagnosis not present

## 2022-03-16 DIAGNOSIS — J918 Pleural effusion in other conditions classified elsewhere: Secondary | ICD-10-CM | POA: Diagnosis not present

## 2022-03-17 DIAGNOSIS — J918 Pleural effusion in other conditions classified elsewhere: Secondary | ICD-10-CM | POA: Diagnosis not present

## 2022-03-17 DIAGNOSIS — I480 Paroxysmal atrial fibrillation: Secondary | ICD-10-CM | POA: Diagnosis not present

## 2022-03-17 DIAGNOSIS — R2689 Other abnormalities of gait and mobility: Secondary | ICD-10-CM | POA: Diagnosis not present

## 2022-03-17 DIAGNOSIS — M6281 Muscle weakness (generalized): Secondary | ICD-10-CM | POA: Diagnosis not present

## 2022-03-20 DIAGNOSIS — M6281 Muscle weakness (generalized): Secondary | ICD-10-CM | POA: Diagnosis not present

## 2022-03-20 DIAGNOSIS — R2689 Other abnormalities of gait and mobility: Secondary | ICD-10-CM | POA: Diagnosis not present

## 2022-03-20 DIAGNOSIS — I480 Paroxysmal atrial fibrillation: Secondary | ICD-10-CM | POA: Diagnosis not present

## 2022-03-20 DIAGNOSIS — J918 Pleural effusion in other conditions classified elsewhere: Secondary | ICD-10-CM | POA: Diagnosis not present

## 2022-03-21 DIAGNOSIS — M6281 Muscle weakness (generalized): Secondary | ICD-10-CM | POA: Diagnosis not present

## 2022-03-21 DIAGNOSIS — J918 Pleural effusion in other conditions classified elsewhere: Secondary | ICD-10-CM | POA: Diagnosis not present

## 2022-03-21 DIAGNOSIS — I480 Paroxysmal atrial fibrillation: Secondary | ICD-10-CM | POA: Diagnosis not present

## 2022-03-21 DIAGNOSIS — R2689 Other abnormalities of gait and mobility: Secondary | ICD-10-CM | POA: Diagnosis not present

## 2022-03-27 DIAGNOSIS — R2689 Other abnormalities of gait and mobility: Secondary | ICD-10-CM | POA: Diagnosis not present

## 2022-03-27 DIAGNOSIS — I480 Paroxysmal atrial fibrillation: Secondary | ICD-10-CM | POA: Diagnosis not present

## 2022-03-27 DIAGNOSIS — J918 Pleural effusion in other conditions classified elsewhere: Secondary | ICD-10-CM | POA: Diagnosis not present

## 2022-03-27 DIAGNOSIS — M6281 Muscle weakness (generalized): Secondary | ICD-10-CM | POA: Diagnosis not present

## 2022-03-29 DIAGNOSIS — I480 Paroxysmal atrial fibrillation: Secondary | ICD-10-CM | POA: Diagnosis not present

## 2022-03-29 DIAGNOSIS — M6281 Muscle weakness (generalized): Secondary | ICD-10-CM | POA: Diagnosis not present

## 2022-03-29 DIAGNOSIS — J918 Pleural effusion in other conditions classified elsewhere: Secondary | ICD-10-CM | POA: Diagnosis not present

## 2022-03-29 DIAGNOSIS — R2689 Other abnormalities of gait and mobility: Secondary | ICD-10-CM | POA: Diagnosis not present

## 2022-03-30 DIAGNOSIS — N390 Urinary tract infection, site not specified: Secondary | ICD-10-CM | POA: Diagnosis not present

## 2022-04-03 DIAGNOSIS — M6281 Muscle weakness (generalized): Secondary | ICD-10-CM | POA: Diagnosis not present

## 2022-04-03 DIAGNOSIS — I480 Paroxysmal atrial fibrillation: Secondary | ICD-10-CM | POA: Diagnosis not present

## 2022-04-03 DIAGNOSIS — R2689 Other abnormalities of gait and mobility: Secondary | ICD-10-CM | POA: Diagnosis not present

## 2022-04-03 DIAGNOSIS — J918 Pleural effusion in other conditions classified elsewhere: Secondary | ICD-10-CM | POA: Diagnosis not present

## 2022-04-04 DIAGNOSIS — M6281 Muscle weakness (generalized): Secondary | ICD-10-CM | POA: Diagnosis not present

## 2022-04-04 DIAGNOSIS — R5381 Other malaise: Secondary | ICD-10-CM | POA: Diagnosis not present

## 2022-04-04 DIAGNOSIS — R2689 Other abnormalities of gait and mobility: Secondary | ICD-10-CM | POA: Diagnosis not present

## 2022-04-04 DIAGNOSIS — I5033 Acute on chronic diastolic (congestive) heart failure: Secondary | ICD-10-CM | POA: Diagnosis not present

## 2022-04-04 DIAGNOSIS — J9621 Acute and chronic respiratory failure with hypoxia: Secondary | ICD-10-CM | POA: Diagnosis not present

## 2022-04-04 DIAGNOSIS — I480 Paroxysmal atrial fibrillation: Secondary | ICD-10-CM | POA: Diagnosis not present

## 2022-04-04 DIAGNOSIS — I4891 Unspecified atrial fibrillation: Secondary | ICD-10-CM | POA: Diagnosis not present

## 2022-04-04 DIAGNOSIS — J918 Pleural effusion in other conditions classified elsewhere: Secondary | ICD-10-CM | POA: Diagnosis not present

## 2022-04-05 DIAGNOSIS — R2689 Other abnormalities of gait and mobility: Secondary | ICD-10-CM | POA: Diagnosis not present

## 2022-04-05 DIAGNOSIS — J918 Pleural effusion in other conditions classified elsewhere: Secondary | ICD-10-CM | POA: Diagnosis not present

## 2022-04-05 DIAGNOSIS — M6281 Muscle weakness (generalized): Secondary | ICD-10-CM | POA: Diagnosis not present

## 2022-04-05 DIAGNOSIS — I480 Paroxysmal atrial fibrillation: Secondary | ICD-10-CM | POA: Diagnosis not present

## 2022-04-06 DIAGNOSIS — J811 Chronic pulmonary edema: Secondary | ICD-10-CM | POA: Diagnosis not present

## 2022-04-06 DIAGNOSIS — J9811 Atelectasis: Secondary | ICD-10-CM | POA: Diagnosis not present

## 2022-04-06 DIAGNOSIS — J90 Pleural effusion, not elsewhere classified: Secondary | ICD-10-CM | POA: Diagnosis not present

## 2022-04-06 DIAGNOSIS — E119 Type 2 diabetes mellitus without complications: Secondary | ICD-10-CM | POA: Diagnosis not present

## 2022-04-06 DIAGNOSIS — J918 Pleural effusion in other conditions classified elsewhere: Secondary | ICD-10-CM | POA: Diagnosis not present

## 2022-04-06 DIAGNOSIS — I509 Heart failure, unspecified: Secondary | ICD-10-CM | POA: Diagnosis not present

## 2022-04-06 DIAGNOSIS — R0602 Shortness of breath: Secondary | ICD-10-CM | POA: Diagnosis not present

## 2022-04-06 DIAGNOSIS — Z79899 Other long term (current) drug therapy: Secondary | ICD-10-CM | POA: Diagnosis not present

## 2022-04-06 DIAGNOSIS — R531 Weakness: Secondary | ICD-10-CM | POA: Diagnosis not present

## 2022-04-06 DIAGNOSIS — I700 Atherosclerosis of aorta: Secondary | ICD-10-CM | POA: Diagnosis not present

## 2022-04-07 DIAGNOSIS — K219 Gastro-esophageal reflux disease without esophagitis: Secondary | ICD-10-CM | POA: Diagnosis not present

## 2022-04-07 DIAGNOSIS — F419 Anxiety disorder, unspecified: Secondary | ICD-10-CM | POA: Diagnosis present

## 2022-04-07 DIAGNOSIS — J80 Acute respiratory distress syndrome: Secondary | ICD-10-CM | POA: Diagnosis not present

## 2022-04-07 DIAGNOSIS — F32A Depression, unspecified: Secondary | ICD-10-CM | POA: Diagnosis not present

## 2022-04-07 DIAGNOSIS — R0602 Shortness of breath: Secondary | ICD-10-CM | POA: Diagnosis not present

## 2022-04-07 DIAGNOSIS — I509 Heart failure, unspecified: Secondary | ICD-10-CM | POA: Diagnosis not present

## 2022-04-07 DIAGNOSIS — I130 Hypertensive heart and chronic kidney disease with heart failure and stage 1 through stage 4 chronic kidney disease, or unspecified chronic kidney disease: Secondary | ICD-10-CM | POA: Diagnosis not present

## 2022-04-07 DIAGNOSIS — I4891 Unspecified atrial fibrillation: Secondary | ICD-10-CM | POA: Diagnosis not present

## 2022-04-07 DIAGNOSIS — B0229 Other postherpetic nervous system involvement: Secondary | ICD-10-CM | POA: Diagnosis not present

## 2022-04-07 DIAGNOSIS — J189 Pneumonia, unspecified organism: Secondary | ICD-10-CM | POA: Diagnosis not present

## 2022-04-07 DIAGNOSIS — I959 Hypotension, unspecified: Secondary | ICD-10-CM | POA: Diagnosis not present

## 2022-04-07 DIAGNOSIS — I110 Hypertensive heart disease with heart failure: Secondary | ICD-10-CM | POA: Diagnosis not present

## 2022-04-07 DIAGNOSIS — Z853 Personal history of malignant neoplasm of breast: Secondary | ICD-10-CM | POA: Diagnosis not present

## 2022-04-07 DIAGNOSIS — J45909 Unspecified asthma, uncomplicated: Secondary | ICD-10-CM | POA: Diagnosis present

## 2022-04-07 DIAGNOSIS — R404 Transient alteration of awareness: Secondary | ICD-10-CM | POA: Diagnosis not present

## 2022-04-07 DIAGNOSIS — J811 Chronic pulmonary edema: Secondary | ICD-10-CM | POA: Diagnosis not present

## 2022-04-07 DIAGNOSIS — I252 Old myocardial infarction: Secondary | ICD-10-CM | POA: Diagnosis not present

## 2022-04-07 DIAGNOSIS — A419 Sepsis, unspecified organism: Secondary | ICD-10-CM | POA: Diagnosis not present

## 2022-04-07 DIAGNOSIS — R0902 Hypoxemia: Secondary | ICD-10-CM | POA: Diagnosis not present

## 2022-04-07 DIAGNOSIS — I700 Atherosclerosis of aorta: Secondary | ICD-10-CM | POA: Diagnosis not present

## 2022-04-07 DIAGNOSIS — M109 Gout, unspecified: Secondary | ICD-10-CM | POA: Diagnosis not present

## 2022-04-07 DIAGNOSIS — Z79899 Other long term (current) drug therapy: Secondary | ICD-10-CM | POA: Diagnosis not present

## 2022-04-07 DIAGNOSIS — J90 Pleural effusion, not elsewhere classified: Secondary | ICD-10-CM | POA: Diagnosis not present

## 2022-04-07 DIAGNOSIS — N183 Chronic kidney disease, stage 3 unspecified: Secondary | ICD-10-CM | POA: Diagnosis not present

## 2022-04-07 DIAGNOSIS — Z880 Allergy status to penicillin: Secondary | ICD-10-CM | POA: Diagnosis not present

## 2022-04-07 DIAGNOSIS — Z7901 Long term (current) use of anticoagulants: Secondary | ICD-10-CM | POA: Diagnosis not present

## 2022-04-07 DIAGNOSIS — J9811 Atelectasis: Secondary | ICD-10-CM | POA: Diagnosis not present

## 2022-04-07 DIAGNOSIS — J9600 Acute respiratory failure, unspecified whether with hypoxia or hypercapnia: Secondary | ICD-10-CM | POA: Diagnosis not present

## 2022-04-07 DIAGNOSIS — I5032 Chronic diastolic (congestive) heart failure: Secondary | ICD-10-CM | POA: Diagnosis not present

## 2022-04-07 DIAGNOSIS — R680 Hypothermia, not associated with low environmental temperature: Secondary | ICD-10-CM | POA: Diagnosis not present

## 2022-04-07 DIAGNOSIS — E039 Hypothyroidism, unspecified: Secondary | ICD-10-CM | POA: Diagnosis not present

## 2022-04-07 DIAGNOSIS — Z881 Allergy status to other antibiotic agents status: Secondary | ICD-10-CM | POA: Diagnosis not present

## 2022-04-07 DIAGNOSIS — Z7401 Bed confinement status: Secondary | ICD-10-CM | POA: Diagnosis not present

## 2022-04-07 DIAGNOSIS — N179 Acute kidney failure, unspecified: Secondary | ICD-10-CM | POA: Diagnosis not present

## 2022-04-07 DIAGNOSIS — Z792 Long term (current) use of antibiotics: Secondary | ICD-10-CM | POA: Diagnosis not present

## 2022-04-07 DIAGNOSIS — D519 Vitamin B12 deficiency anemia, unspecified: Secondary | ICD-10-CM | POA: Diagnosis not present

## 2022-04-07 DIAGNOSIS — Z66 Do not resuscitate: Secondary | ICD-10-CM | POA: Diagnosis not present

## 2022-04-07 DIAGNOSIS — M1990 Unspecified osteoarthritis, unspecified site: Secondary | ICD-10-CM | POA: Diagnosis not present

## 2022-04-07 DIAGNOSIS — Z87440 Personal history of urinary (tract) infections: Secondary | ICD-10-CM | POA: Diagnosis not present

## 2022-04-07 DIAGNOSIS — Z888 Allergy status to other drugs, medicaments and biological substances status: Secondary | ICD-10-CM | POA: Diagnosis not present

## 2022-04-07 DIAGNOSIS — I2510 Atherosclerotic heart disease of native coronary artery without angina pectoris: Secondary | ICD-10-CM | POA: Diagnosis not present

## 2022-04-07 DIAGNOSIS — E785 Hyperlipidemia, unspecified: Secondary | ICD-10-CM | POA: Diagnosis present

## 2022-04-07 DIAGNOSIS — E441 Mild protein-calorie malnutrition: Secondary | ICD-10-CM | POA: Diagnosis not present

## 2022-04-09 DIAGNOSIS — R846 Abnormal cytological findings in specimens from respiratory organs and thorax: Secondary | ICD-10-CM | POA: Diagnosis not present

## 2022-04-16 ENCOUNTER — Ambulatory Visit: Payer: Medicare Other | Admitting: Podiatry

## 2022-05-08 DEATH — deceased
